# Patient Record
Sex: Female | Born: 1950
Health system: Southern US, Community
[De-identification: ages and names within clinical notes are randomized; demographics above are authoritative.]

## PROBLEM LIST (undated history)

## (undated) ENCOUNTER — Emergency Department: Discharge status: Absent without leave | Payer: Medicare HMO

## (undated) ENCOUNTER — Emergency Department: Admission: EM

## (undated) DIAGNOSIS — K219 Gastro-esophageal reflux disease without esophagitis: Secondary | ICD-10-CM

## (undated) DIAGNOSIS — I452 Bifascicular block: Secondary | ICD-10-CM

## (undated) DIAGNOSIS — I509 Heart failure, unspecified: Secondary | ICD-10-CM

## (undated) DIAGNOSIS — G43919 Migraine, unspecified, intractable, without status migrainosus: Secondary | ICD-10-CM

## (undated) DIAGNOSIS — I10 Essential (primary) hypertension: Secondary | ICD-10-CM

## (undated) DIAGNOSIS — I471 Supraventricular tachycardia, unspecified: Secondary | ICD-10-CM

## (undated) HISTORY — DX: Heart failure, unspecified: I50.9

## (undated) HISTORY — DX: Gastro-esophageal reflux disease without esophagitis: K21.9

## (undated) HISTORY — DX: Migraine, unspecified, intractable, without status migrainosus: G43.919

## (undated) HISTORY — DX: Essential (primary) hypertension: I10

## (undated) HISTORY — DX: Supraventricular tachycardia, unspecified: I47.10

## (undated) HISTORY — DX: Bifascicular block: I45.2

## (undated) HISTORY — DX: Supraventricular tachycardia: I47.1

---

## 2004-12-14 ENCOUNTER — Emergency Department: Payer: Self-pay | Admitting: Internal Medicine

## 2004-12-22 ENCOUNTER — Emergency Department: Payer: Self-pay | Admitting: Emergency Medicine

## 2005-03-03 ENCOUNTER — Ambulatory Visit: Payer: Self-pay | Admitting: Family Medicine

## 2005-06-30 ENCOUNTER — Ambulatory Visit: Payer: Self-pay | Admitting: Family Medicine

## 2005-11-30 ENCOUNTER — Ambulatory Visit: Payer: Self-pay | Admitting: Internal Medicine

## 2006-12-15 DIAGNOSIS — G43919 Migraine, unspecified, intractable, without status migrainosus: Secondary | ICD-10-CM | POA: Insufficient documentation

## 2007-05-19 ENCOUNTER — Ambulatory Visit: Payer: Self-pay | Admitting: Family Medicine

## 2007-05-19 DIAGNOSIS — M545 Low back pain, unspecified: Secondary | ICD-10-CM | POA: Insufficient documentation

## 2007-05-19 DIAGNOSIS — M79604 Pain in right leg: Secondary | ICD-10-CM | POA: Insufficient documentation

## 2007-06-01 ENCOUNTER — Telehealth (INDEPENDENT_AMBULATORY_CARE_PROVIDER_SITE_OTHER): Payer: Self-pay | Admitting: *Deleted

## 2007-06-08 ENCOUNTER — Telehealth: Payer: Self-pay | Admitting: Family Medicine

## 2007-06-15 ENCOUNTER — Telehealth: Payer: Self-pay | Admitting: Family Medicine

## 2007-08-10 ENCOUNTER — Telehealth: Payer: Self-pay | Admitting: Family Medicine

## 2007-09-21 HISTORY — PX: PARATHYROIDECTOMY: SHX19

## 2007-11-23 ENCOUNTER — Ambulatory Visit: Payer: Self-pay | Admitting: Internal Medicine

## 2007-11-23 ENCOUNTER — Encounter: Payer: Self-pay | Admitting: Family Medicine

## 2007-11-23 ENCOUNTER — Inpatient Hospital Stay (HOSPITAL_COMMUNITY): Admission: EM | Admit: 2007-11-23 | Discharge: 2007-12-01 | Payer: Self-pay | Admitting: Family Medicine

## 2007-11-24 ENCOUNTER — Encounter: Payer: Self-pay | Admitting: Family Medicine

## 2007-11-24 ENCOUNTER — Encounter (INDEPENDENT_AMBULATORY_CARE_PROVIDER_SITE_OTHER): Payer: Self-pay | Admitting: Cardiology

## 2007-11-28 ENCOUNTER — Encounter: Payer: Self-pay | Admitting: Family Medicine

## 2007-12-14 ENCOUNTER — Encounter: Admission: RE | Admit: 2007-12-14 | Discharge: 2008-01-16 | Payer: Self-pay | Admitting: *Deleted

## 2007-12-25 ENCOUNTER — Encounter: Payer: Self-pay | Admitting: Family Medicine

## 2007-12-27 ENCOUNTER — Telehealth: Payer: Self-pay | Admitting: Family Medicine

## 2008-01-11 ENCOUNTER — Ambulatory Visit: Payer: Self-pay | Admitting: Family Medicine

## 2008-01-16 LAB — CONVERTED CEMR LAB
CO2: 31 meq/L (ref 19–32)
Chloride: 108 meq/L (ref 96–112)
GFR calc non Af Amer: 79 mL/min
Phosphorus: 2.5 mg/dL (ref 2.3–4.6)
Potassium: 3.9 meq/L (ref 3.5–5.1)

## 2008-01-24 ENCOUNTER — Encounter: Payer: Self-pay | Admitting: Family Medicine

## 2008-01-25 ENCOUNTER — Telehealth: Payer: Self-pay | Admitting: Family Medicine

## 2008-01-26 ENCOUNTER — Encounter: Payer: Self-pay | Admitting: Family Medicine

## 2008-02-02 ENCOUNTER — Telehealth: Payer: Self-pay | Admitting: Family Medicine

## 2008-02-09 ENCOUNTER — Encounter: Payer: Self-pay | Admitting: Family Medicine

## 2008-02-13 ENCOUNTER — Ambulatory Visit: Payer: Self-pay | Admitting: Family Medicine

## 2008-02-13 DIAGNOSIS — R1013 Epigastric pain: Secondary | ICD-10-CM

## 2008-02-13 DIAGNOSIS — K3189 Other diseases of stomach and duodenum: Secondary | ICD-10-CM | POA: Insufficient documentation

## 2008-02-14 ENCOUNTER — Telehealth: Payer: Self-pay | Admitting: Family Medicine

## 2008-02-23 ENCOUNTER — Encounter: Payer: Self-pay | Admitting: Family Medicine

## 2008-02-23 ENCOUNTER — Telehealth: Payer: Self-pay | Admitting: Family Medicine

## 2008-02-23 DIAGNOSIS — Z8679 Personal history of other diseases of the circulatory system: Secondary | ICD-10-CM | POA: Insufficient documentation

## 2008-02-27 ENCOUNTER — Telehealth: Payer: Self-pay | Admitting: Family Medicine

## 2008-02-29 ENCOUNTER — Encounter: Payer: Self-pay | Admitting: Family Medicine

## 2008-03-08 ENCOUNTER — Encounter (INDEPENDENT_AMBULATORY_CARE_PROVIDER_SITE_OTHER): Payer: Self-pay | Admitting: *Deleted

## 2008-03-08 ENCOUNTER — Telehealth: Payer: Self-pay | Admitting: Family Medicine

## 2008-03-13 ENCOUNTER — Encounter: Payer: Self-pay | Admitting: Family Medicine

## 2008-03-13 ENCOUNTER — Ambulatory Visit (HOSPITAL_COMMUNITY): Admission: RE | Admit: 2008-03-13 | Discharge: 2008-03-14 | Payer: Self-pay | Admitting: Surgery

## 2008-03-13 ENCOUNTER — Encounter (INDEPENDENT_AMBULATORY_CARE_PROVIDER_SITE_OTHER): Payer: Self-pay | Admitting: Surgery

## 2008-04-02 ENCOUNTER — Encounter: Payer: Self-pay | Admitting: Family Medicine

## 2008-05-08 ENCOUNTER — Ambulatory Visit: Payer: Self-pay | Admitting: Family Medicine

## 2008-05-08 DIAGNOSIS — M79609 Pain in unspecified limb: Secondary | ICD-10-CM | POA: Insufficient documentation

## 2008-05-08 DIAGNOSIS — M549 Dorsalgia, unspecified: Secondary | ICD-10-CM | POA: Insufficient documentation

## 2008-05-15 ENCOUNTER — Encounter: Payer: Self-pay | Admitting: Family Medicine

## 2008-05-30 ENCOUNTER — Telehealth (INDEPENDENT_AMBULATORY_CARE_PROVIDER_SITE_OTHER): Payer: Self-pay | Admitting: *Deleted

## 2008-05-31 ENCOUNTER — Telehealth (INDEPENDENT_AMBULATORY_CARE_PROVIDER_SITE_OTHER): Payer: Self-pay | Admitting: *Deleted

## 2008-06-10 ENCOUNTER — Encounter: Payer: Self-pay | Admitting: Family Medicine

## 2008-06-28 ENCOUNTER — Telehealth (INDEPENDENT_AMBULATORY_CARE_PROVIDER_SITE_OTHER): Payer: Self-pay | Admitting: *Deleted

## 2008-07-03 ENCOUNTER — Ambulatory Visit: Payer: Self-pay | Admitting: Family Medicine

## 2008-07-03 DIAGNOSIS — J301 Allergic rhinitis due to pollen: Secondary | ICD-10-CM | POA: Insufficient documentation

## 2008-07-17 ENCOUNTER — Telehealth: Payer: Self-pay | Admitting: Family Medicine

## 2008-07-17 DIAGNOSIS — M4800 Spinal stenosis, site unspecified: Secondary | ICD-10-CM | POA: Insufficient documentation

## 2008-08-02 ENCOUNTER — Telehealth: Payer: Self-pay | Admitting: Family Medicine

## 2008-09-09 ENCOUNTER — Telehealth (INDEPENDENT_AMBULATORY_CARE_PROVIDER_SITE_OTHER): Payer: Self-pay | Admitting: *Deleted

## 2008-09-20 HISTORY — PX: LUMBAR LAMINECTOMY: SHX95

## 2008-09-24 ENCOUNTER — Ambulatory Visit: Payer: Self-pay | Admitting: Cardiology

## 2008-09-26 ENCOUNTER — Ambulatory Visit: Payer: Self-pay

## 2008-10-01 ENCOUNTER — Ambulatory Visit: Payer: Self-pay | Admitting: Cardiology

## 2008-10-01 LAB — CONVERTED CEMR LAB
Total CHOL/HDL Ratio: 3.7
Triglycerides: 70 mg/dL (ref 0–149)

## 2008-10-11 ENCOUNTER — Encounter: Payer: Self-pay | Admitting: Family Medicine

## 2008-10-14 DIAGNOSIS — I452 Bifascicular block: Secondary | ICD-10-CM | POA: Insufficient documentation

## 2008-10-15 ENCOUNTER — Ambulatory Visit: Payer: Self-pay | Admitting: Cardiology

## 2008-11-05 ENCOUNTER — Telehealth: Payer: Self-pay | Admitting: Family Medicine

## 2008-11-06 ENCOUNTER — Encounter: Payer: Self-pay | Admitting: Family Medicine

## 2008-11-07 ENCOUNTER — Encounter: Payer: Self-pay | Admitting: Family Medicine

## 2009-02-11 ENCOUNTER — Telehealth: Payer: Self-pay | Admitting: Cardiology

## 2009-02-14 ENCOUNTER — Encounter: Payer: Self-pay | Admitting: Cardiology

## 2009-02-18 ENCOUNTER — Encounter: Payer: Self-pay | Admitting: Family Medicine

## 2009-02-18 ENCOUNTER — Telehealth: Payer: Self-pay | Admitting: Cardiology

## 2009-02-18 ENCOUNTER — Telehealth (INDEPENDENT_AMBULATORY_CARE_PROVIDER_SITE_OTHER): Payer: Self-pay | Admitting: *Deleted

## 2009-04-21 ENCOUNTER — Telehealth: Payer: Self-pay | Admitting: Family Medicine

## 2009-04-28 ENCOUNTER — Encounter: Admission: RE | Admit: 2009-04-28 | Discharge: 2009-04-28 | Payer: Self-pay | Admitting: Neurosurgery

## 2009-05-30 ENCOUNTER — Encounter: Payer: Self-pay | Admitting: Cardiology

## 2009-05-30 ENCOUNTER — Encounter: Payer: Self-pay | Admitting: Family Medicine

## 2009-07-10 ENCOUNTER — Encounter: Payer: Self-pay | Admitting: Cardiology

## 2009-07-14 ENCOUNTER — Encounter: Payer: Self-pay | Admitting: Cardiology

## 2009-07-21 ENCOUNTER — Inpatient Hospital Stay (HOSPITAL_COMMUNITY): Admission: RE | Admit: 2009-07-21 | Discharge: 2009-07-28 | Payer: Self-pay | Admitting: Neurosurgery

## 2009-08-18 ENCOUNTER — Encounter: Payer: Self-pay | Admitting: Cardiology

## 2009-10-15 ENCOUNTER — Encounter: Payer: Self-pay | Admitting: Family Medicine

## 2009-10-15 ENCOUNTER — Encounter: Payer: Self-pay | Admitting: Cardiology

## 2009-10-23 ENCOUNTER — Ambulatory Visit: Payer: Self-pay | Admitting: Family Medicine

## 2009-10-29 ENCOUNTER — Encounter: Admission: RE | Admit: 2009-10-29 | Discharge: 2009-11-20 | Payer: Self-pay | Admitting: Neurosurgery

## 2009-11-27 ENCOUNTER — Telehealth: Payer: Self-pay | Admitting: Family Medicine

## 2009-12-09 ENCOUNTER — Telehealth: Payer: Self-pay | Admitting: Family Medicine

## 2010-01-23 ENCOUNTER — Encounter: Payer: Self-pay | Admitting: Cardiology

## 2010-05-05 ENCOUNTER — Ambulatory Visit: Payer: Self-pay | Admitting: Emergency Medicine

## 2010-05-05 LAB — HM COLONOSCOPY: HM Colonoscopy: NORMAL

## 2010-08-17 ENCOUNTER — Encounter: Payer: Self-pay | Admitting: Cardiology

## 2010-09-01 ENCOUNTER — Encounter: Payer: Self-pay | Admitting: Cardiology

## 2010-09-03 ENCOUNTER — Encounter: Payer: Self-pay | Admitting: Cardiology

## 2010-09-03 ENCOUNTER — Ambulatory Visit: Payer: Self-pay | Admitting: Cardiology

## 2010-09-03 DIAGNOSIS — R002 Palpitations: Secondary | ICD-10-CM | POA: Insufficient documentation

## 2010-09-09 ENCOUNTER — Telehealth (INDEPENDENT_AMBULATORY_CARE_PROVIDER_SITE_OTHER): Payer: Self-pay | Admitting: *Deleted

## 2010-09-17 ENCOUNTER — Ambulatory Visit: Payer: Self-pay | Admitting: Cardiology

## 2010-09-25 ENCOUNTER — Telehealth: Payer: Self-pay | Admitting: Cardiology

## 2010-10-20 NOTE — Progress Notes (Signed)
Summary: [facility]  [facility]   Imported By: Debby Freiberg 03/14/2010 09:49:56  _____________________________________________________________________  External Attachment:    Type:   Image     Comment:   External Document

## 2010-10-20 NOTE — Letter (Signed)
Summary: [facility]  [facility]   Imported By: Lanelle Bal 10/27/2009 12:21:02  _____________________________________________________________________  External Attachment:    Type:   Image     Comment:   External Document

## 2010-10-20 NOTE — Progress Notes (Signed)
Summary: pt wants DNA checked  Phone Note Call from Patient Call back at [phone]   Caller: Patient Call For: Judith Part MD Summary of Call: Pt is asking how she would go about getting her DNA checked.  I asked her why and she said she is trying to find out about some relatives.  They need their's checked as well.  She asked if this is expensive and I told her probably so.  What else do I tell her? Initial call taken by: Lowella Petties CMA,  December 09, 2009 12:03 PM  Follow-up for Phone Call        that is not something I can do here- tell her to check with the health dept for her county and see if they can adv her further Follow-up by: Judith Part MD,  December 09, 2009 1:19 PM  Additional Follow-up for Phone Call Additional follow up Details #1::        Patient advised.Consuello Masse CMA  Additional Follow-up by: Benny Lennert CMA Duncan Dull),  December 09, 2009 3:25 PM

## 2010-10-20 NOTE — Letter (Signed)
Summary: [facility]  [facility]   Imported By: Lanelle Bal 02/13/2010 12:15:40  _____________________________________________________________________  External Attachment:    Type:   Image     Comment:   External Document

## 2010-10-20 NOTE — Progress Notes (Signed)
Summary: Rx Furosemide  Phone Note Refill Request Call back at [phone] Message from:  [facility] on November 27, 2009 11:06 AM  Refills Requested: Medication #1:  FUROSEMIDE 40 MG  TABS 1 by mouth once daily   Last Refilled: 10/27/2009 Received faxed refill request, patient has not been seen since 06/2008.  Called pharmacy to let them know patient can have a refill but she needs to call and schedule appt before refill will be given.    Method Requested: Electronic Initial call taken by: Linde Gillis CMA Duncan Dull),  November 27, 2009 11:08 AM  Follow-up for Phone Call        refil times one and sched f/u please px written on EMR for call in  Follow-up by: Judith Part MD,  November 27, 2009 1:52 PM  Additional Follow-up for Phone Call Additional follow up Details #1::        Medication phoned to [facility] pharmacy as instructed. Rob will let pt know needs to make appt. before more refills.Lewanda Rife LPN  November 27, 2009 2:32 PM     New/Updated Medications: FUROSEMIDE 40 MG  TABS (FUROSEMIDE) 1 by mouth once daily Prescriptions: FUROSEMIDE 40 MG  TABS (FUROSEMIDE) 1 by mouth once daily  #30 x 0   Entered and Authorized by:   Judith Part MD   Signed by:   Lewanda Rife LPN on 14/78/2956   Method used:   Telephoned to ...         RxID:   [id number]

## 2010-10-22 NOTE — Progress Notes (Signed)
Summary: monitor results 09/17/10  Phone Note Outgoing Call   Call placed by: Katina Dung, RN, BSN,  September 25, 2010 2:12 PM Call placed to: Patient Summary of Call: monitor results   Follow-up for Phone Call        Dr Shirlee Latch reviewed monitor results done  09/17/10--NSR,PACs, no significant arrhythmias--LMTCB for pt Katina Dung, RN, BSN  September 25, 2010 2:13 PM --pt notified of monitor results

## 2010-10-22 NOTE — Procedures (Signed)
Summary: summary report  summary report   Imported By: Mirna Mires 09/28/2010 08:54:08  _____________________________________________________________________  External Attachment:    Type:   Image     Comment:   External Document

## 2010-10-22 NOTE — Letter (Signed)
Summary: [facility]   [facility]   Imported By: Roderic Ovens 09/24/2010 15:41:41  _____________________________________________________________________  External Attachment:    Type:   Image     Comment:   External Document

## 2010-10-22 NOTE — Procedures (Signed)
Summary: Summary Report  Summary Report   Imported By: Erle Crocker 10/07/2010 16:21:42  _____________________________________________________________________  External Attachment:    Type:   Image     Comment:   External Document

## 2010-10-22 NOTE — Assessment & Plan Note (Signed)
Summary: [facility]   Primary Provider:  Dr. Lacie Scotts   History of Present Illness: 60 yo with history of SVT and atypical chest pain presents for followup.  Her back is feeling much better than the last time I saw her, and her mobility is improved (she had a laminectomy).  She still occasionally feels her heart beat "fast."  This can last for a few minutes at a time.  It does not cause lightheadedness or syncope.  She does get "soreness" in her chest with these episodes that can actually last for hours.  These episodes happen about once a month.  No exertional chest pain.  No exertionaly dyspnea.    ECG: NSR, LAFB, RBBB  Current Medications (verified): 1)  Furosemide 40 Mg  Tabs (Furosemide) .Marland Kitchen.. 1 By Mouth Once Daily 2)  Omeprazole 20 Mg  Cpdr (Omeprazole) .Marland Kitchen.. 1 By Mouth Each Am 30 Minutes Before Breakfast and Again in Evening 3)  Ultram 50 Mg  Tabs (Tramadol Hcl) .... Take 1-2 By Mouth Up To Three Times A Day As Needed Pain 4)  Flexeril 10 Mg  Tabs (Cyclobenzaprine Hcl) .... 1/2 To 1 By Mouth Three Times A Day As Needed Back/shoulderblade Pain 5)  Gabapentin 100 Mg Caps (Gabapentin) .... One By Mouth Three Times A Day As Needed 6)  Hydrocodone-Acetaminophen 5-500 Mg Tabs (Hydrocodone-Acetaminophen) .... As Directed 7)  Flonase 50 Mcg/act Susp (Fluticasone Propionate) .... 2 Sprays in Each Nostril Once Daily 8)  Meloxicam 7.5 Mg Tabs (Meloxicam) .Marland Kitchen.. 1 By Mouth Daily 9)  Vitamin D 1000 Unit Tabs (Cholecalciferol) .Marland Kitchen.. 1 By Mouth Daily 10)  Fosamax 35 Mg Tabs (Alendronate Sodium) .Marland Kitchen.. 1 By Mouth Weekly  Allergies (verified): No Known Drug Allergies  Past History:  Past Medical History: 1.  Hyperparathyroidism.  The patient is status post parathyroidectomy in March 2009. 2.  History of SVT in the setting of hypercalcemia.  Holter monitor done in January 2010, showed occasional PAC.  There were no runs of SVT.  3. Lumbar spine disease.  The patient has lumbar spondylosis, lumbar degenerative  disk disease, radiculopathy with pain down her right leg.  s/p laminectomy in 2010.  4. Cardiac MRI in March 2009.  This was done to evaluate for source of SVT.  There is no evidence for infiltrative disease.  EF was 58%. 5. Gastroesophageal reflux disease. 6. Chest pain.  Adenosine Myoview done in January 2010, EF was 65%.  There was some evidence for some breast attenuation.  No evidence for ischemia. 7. RBB W/ LFAB (ICD-426.52) 8. ALLERGIC RHINITIS, SEASONAL (ICD-477.0) 9. MIGRAINE NOS W/INTRACTABLE MIGRAINE (ICD-346.91) 10. nephrolithiasis  surgery--- Dr Silvano Rusk podiatry -- Dr Al Corpus  neurosx --Dr Sherren Kerns  Family History: Reviewed history from 09/01/2010 and no changes required. does not know much fam hx -- ? not in contact pt is considering trying to get some fam hx  Father: CVA or Stroke:  Mother: brain tumor  Social History: Reviewed history from 10/14/2008 and no changes required. unemployed, disabled Tobacco Use - No.  Alcohol Use - no Drug Use - no Married  Six children  Review of Systems       All systems reviewed and negative except as per HPI.   Vital Signs:  Patient profile:   60 year old female Height:      66 inches Weight:      140 pounds BMI:     22.68 Pulse rate:   76 / minute Resp:     16 per minute BP sitting:  102 / 74  (left arm)  Vitals Entered By: Marrion Coy, CNA (September 03, 2010 10:21 AM)  Physical Exam  General:  Well developed, well nourished, in no acute distress. Neck:  Neck supple, no JVD. No masses, thyromegaly or abnormal cervical nodes. Lungs:  Clear bilaterally to auscultation and percussion. Heart:  Non-displaced PMI, chest non-tender; regular rate and rhythm, S1, S2 without murmurs, rubs or gallops. Widely split S2.  Carotid upstroke normal, no bruit. Pedals normal pulses. No edema, no varicosities. Abdomen:  Bowel sounds positive; abdomen soft and non-tender without masses, organomegaly, or hernias noted. No  hepatosplenomegaly. Extremities:  No clubbing or cyanosis. Neurologic:  Alert and oriented x 3. Psych:  Normal affect.   Impression & Recommendations:  Problem # 1:  SUPRAVENTRICULAR TACHYCARDIA, HX OF (ICD-V12.59) Still getting palpitations where she feels her heart race about once a month.  No lightheadedness or syncope.  Will have her start Toprol XL 12.5 mg daily and will get a 48 hour holter.   Problem # 2:  CHEST TIGHTNESS-PRESSURE-OTHER ([id number]) Very atypical chest pain with normal myoview in 2010.  No further workup at this time.  I recommended that she take ASA 81 mg daily.   Other Orders: Holter (Holter)  Patient Instructions: 1)  Start Toprol XL (metoprolol succ) 25mg  1/2 tablet once daily. 2)  Start Aspirin 81mg  once daily. 3)  Your physician has recommended that you wear a 48 hour holter monitor.  Holter monitors are medical devices that record the heart's electrical activity. Doctors most often use these monitors to diagnose arrhythmias. Arrhythmias are problems with the speed or rhythm of the heartbeat. The monitor is a small, portable device. You can wear one while you do your normal daily activities. This is usually used to diagnose what is causing palpitations/syncope (passing out). 4)  Your physician wants you to follow-up in: 1 year. You will receive a reminder letter in the mail two months in advance. If you don't receive a letter, please call our office to schedule the follow-up appointment. Prescriptions: METOPROLOL SUCCINATE 25 MG XR24H-TAB (METOPROLOL SUCCINATE) Take 1/2  tablet by mouth daily  #30 x 6   Entered by:   Sherri Rad, RN, BSN   Authorized by:   Marca Ancona, MD   Signed by:   Sherri Rad, RN, BSN on 09/03/2010   Method used:   Electronically to        [facility]* (retail)       [address]       [place], [place]  [address]       Ph: [phone]       Fax: [phone]   RxID:   [id number]

## 2010-10-22 NOTE — Letter (Signed)
Summary: [facility]  [facility]   Imported By: Lanelle Bal 09/03/2010 14:23:20  _____________________________________________________________________  External Attachment:    Type:   Image     Comment:   External Document

## 2010-10-22 NOTE — Progress Notes (Signed)
Summary: holter monitor  Phone Note Outgoing Call   Action Taken: Appt scheduled Summary of Call: Patient have appt. for 09/17/10 for monitor Initial call taken by: Marcos Eke,  September 09, 2010 1:47 PM

## 2010-12-23 LAB — BASIC METABOLIC PANEL
Calcium: 7.7 mg/dL — ABNORMAL LOW (ref 8.4–10.5)
Chloride: 105 mEq/L (ref 96–112)
Creatinine, Ser: 1.13 mg/dL (ref 0.4–1.2)
GFR calc Af Amer: >60 mL/min (ref >60)
GFR calc Af Amer: >60 mL/min (ref >60)
GFR calc non Af Amer: 50 mL/min — ABNORMAL LOW (ref >60)
GFR calc non Af Amer: >60 mL/min (ref >60)
Glucose, Bld: 128 mg/dL — ABNORMAL HIGH (ref 70–99)
Glucose, Bld: 141 mg/dL — ABNORMAL HIGH (ref 70–99)
Sodium: 138 mEq/L (ref 135–145)

## 2010-12-23 LAB — POCT I-STAT 4, (NA,K, GLUC, HGB,HCT)
Glucose, Bld: 126 mg/dL — ABNORMAL HIGH (ref 70–99)
HCT: 24 % — ABNORMAL LOW (ref 36.0–46.0)
Hemoglobin: 10.2 g/dL — ABNORMAL LOW (ref 12.0–15.0)
Sodium: 140 mEq/L (ref 135–145)

## 2010-12-23 LAB — CBC
Hemoglobin: 11.1 g/dL — ABNORMAL LOW (ref 12.0–15.0)
Hemoglobin: 11.8 g/dL — ABNORMAL LOW (ref 12.0–15.0)
Hemoglobin: 11.8 g/dL — ABNORMAL LOW (ref 12.0–15.0)
MCHC: 34.4 g/dL (ref 30.0–36.0)
MCHC: 34.5 g/dL (ref 30.0–36.0)
MCV: 91.4 fL (ref 78.0–100.0)
Platelets: 137 10*3/uL — ABNORMAL LOW (ref 150–400)
Platelets: 142 10*3/uL — ABNORMAL LOW (ref 150–400)
RBC: 3.72 MIL/uL — ABNORMAL LOW (ref 3.87–5.11)
RDW: 14.2 % (ref 11.5–15.5)
RDW: 14.6 % (ref 11.5–15.5)
WBC: 7.4 10*3/uL (ref 4.0–10.5)
WBC: 9.7 10*3/uL (ref 4.0–10.5)

## 2010-12-23 LAB — DIFFERENTIAL
Basophils Absolute: 0 10*3/uL (ref 0.0–0.1)
Basophils Relative: 0 % (ref 0–1)
Eosinophils Absolute: 0.2 10*3/uL (ref 0.0–0.7)
Neutrophils Relative %: 81 % — ABNORMAL HIGH (ref 43–77)

## 2010-12-23 LAB — MRSA PCR SCREENING: MRSA by PCR: NEGATIVE

## 2010-12-24 LAB — BASIC METABOLIC PANEL
BUN: 19 mg/dL (ref 6–23)
CO2: 29 mEq/L (ref 19–32)
Calcium: 9.6 mg/dL (ref 8.4–10.5)
Chloride: 106 mEq/L (ref 96–112)
Creatinine, Ser: 0.97 mg/dL (ref 0.4–1.2)
GFR calc Af Amer: >60 mL/min (ref >60)
GFR calc non Af Amer: 59 mL/min — ABNORMAL LOW (ref >60)
Glucose, Bld: 92 mg/dL (ref 70–99)
Potassium: 3.9 mEq/L (ref 3.5–5.1)
Sodium: 143 mEq/L (ref 135–145)

## 2010-12-24 LAB — URINALYSIS, ROUTINE W REFLEX MICROSCOPIC
Bilirubin Urine: NEGATIVE
Glucose, UA: NEGATIVE mg/dL
Ketones, ur: NEGATIVE mg/dL
Nitrite: NEGATIVE
Specific Gravity, Urine: 1.023 (ref 1.005–1.030)
pH: 6 (ref 5.0–8.0)

## 2010-12-24 LAB — TYPE AND SCREEN
ABO/RH(D): B POS
Antibody Screen: NEGATIVE

## 2010-12-24 LAB — CBC
Platelets: 202 10*3/uL (ref 150–400)
RBC: 4.01 MIL/uL (ref 3.87–5.11)
RDW: 13.6 % (ref 11.5–15.5)
WBC: 6.3 10*3/uL (ref 4.0–10.5)

## 2010-12-24 LAB — APTT: aPTT: 29 seconds (ref 24–37)

## 2010-12-24 LAB — PREPARE RBC (CROSSMATCH)

## 2010-12-24 LAB — ABO/RH: ABO/RH(D): B POS

## 2011-02-02 NOTE — Discharge Summary (Signed)
NAME:  Madison, Miller           ACCOUNT NO.:  [id number]   MEDICAL RECORD NO.:  [id number]          PATIENT TYPE:  INP   LOCATION:  4737                         FACILITY:  [facility]   PHYSICIAN:  Madaline Savage, MD        DATE OF BIRTH:  March 21, 1951   DATE OF ADMISSION:  11/22/2007  DATE OF DISCHARGE:  12/01/2007                               DISCHARGE SUMMARY   ADDENDUM:  This is an addendum to discharge summary dictated by Dr.  Salome Arnt on November 27, 2007.  Please see the prior discharge summary for  a complete list of discharge diagnoses and hospital course.   CONSULTATIONS IN THE HOSPITAL:  Dr. Phoebe Perch from neurosurgery, Dr. Donnie Aho  from cardiology, and Dr. Graciela Husbands from electrophysiology.   PROCEDURES DONE IN THE HOSPITAL:  1. She had a sestamibi scan of the parathyroid done on November 29, 2007,      which showed a suspected parathyroid adenoma in the upper pole of      the left lobe of the thyroid.  2. She had lumbar spine MRI done on November 29, 2007, which showed      advanced facet arthropathy at L5-S1 with 8 mm of anterolisthesis,      shallow protrusion of the disk material, stenosis of the canal,      most pronounced in the lateral recess, lesser degenerative changes      at L4-L5 with disk bulge slightly more pronounced towards the left,      mild facet arthropathy, mild narrowing of lateral recess.  3. She had an MRI of her heart done on November 28, 2007, which showed no      MRI evidence of left ventricular myocardial infiltrative process.   HOSPITAL COURSE:  1. Parathyroid adenoma.  Madison Miller was admitted and was found to      have a hypercalcemia with a calcium elevated of more than 13.5.  We      contacted Dr. Evlyn Kanner, the endocrinologist, and he recommended      getting a sestamibi scan and PTH level.  Her PTH level was elevated      at 260.3 and her sestamibi scan showed a suspected parathyroid      adenoma.  As per Dr. Evlyn Kanner, she would need surgery.  I called in a  consult for Dr. Gerrit Friends, but I was told that they would prefer to      see the patient as an outpatient.  She was started on IV fluids and      Lasix, and her calcium has steadily come down and at the time of      discharge her calcium is 10.8.  She is asymptomatic with this.  I      have discussed it with Dr. Evlyn Kanner, who has recommended that she can      be discharged home with the p.o. Lasix, and I would also encourage      her to take lots of fluids by mouth.  I called with Dr. Ardine Eng      office and I have gotten her an appointment for Friday, April  3, at      11:15 a.m.  2. Spinal stenosis.  Madison Miller was complaining of leg pain on the      right side and so we consulted [facility] and was seen      by Dr. Phoebe Perch.  At this time, there is no definite weakness.  Her      straight leg raising test is negative.  Dr. Phoebe Perch recommended to      continue physical therapy and can also try a short course of      steroids.  With physical therapy her pain has improved and she will      be referred for outpatient physical therapy.  She is also      recommended to follow up with Dr. Phoebe Perch as an outpatient, and she      will be given his phone number.  3. Supraventricular tachycardia.  She had supraventricular      tachycardia, and at this time we think it is secondary to her      hypercalcemia.  We did a cardiac MRI to rule out an infiltrative      process and it was negative.  Since her calcium has been corrected,      she has not had any more arrhythmias.   DISPOSITION:  She is now being discharged home in stable condition.   FOLLOW UP:  We got her an appointment to see Dr. Gerrit Friends to get her  parathyroid surgery, and she has an appointment on Friday, April 3, at  10:15 a.m. She is given his phone number, [phone].  She is also asked  to follow up with Dr. Phoebe Perch, the neurosurgeon, and she is given his  phone number, which is [phone].  She is asked to call and get an   appointment to see him in 1 to 2 months.   DISCHARGE MEDICATIONS:  She is being discharged on Lasix 40 mg by mouth  daily and aspirin 81 mg daily.      Madaline Savage, MD  Electronically Signed     PKN/MEDQ  D:  12/01/2007  T:  12/02/2007  Job:  [id number]

## 2011-02-02 NOTE — Assessment & Plan Note (Signed)
[facility]                            CARDIOLOGY OFFICE NOTE   Madison Miller, Madison Miller                  MRN:          [id number]  DATE:09/24/2008                            DOB:          [birth year]    PRIMARY CARE PHYSICIAN:  Dr. Evelene Croon at [facility]  practice.   NEUROSURGEON:  Clydene Fake, MD at [facility], [facility]   HISTORY OF PRESENT ILLNESS:  This is a 60 year old with a history of SVT  and lumbar degenerative disk disease with radiculopathy who presents to  Cardiology Clinic for preoperative evaluation.  The patient does have a  long history of low back pain and radicular pain down her right leg.  She has been noted on MRI to have lumbar degenerative disk disease,  spondylolisthesis, and radiculopathy and she has been set up for back  surgery with Dr. Phoebe Perch.  Prior to that, he wanted her to have a cardiac  evaluation.  The patient was in the hospital earlier in 2009 for primary  hyperparathyroidism.  She had hypercalcemia and underwent  parathyroidectomy.  She did develop SVT in the setting of hypercalcemia.  She also had a cardiac MRI done in March 2009 to assess for infiltrative  disease as a cause of her AV arrhythmia.  She did have normal LV  systolic function and no evidence for infiltrative disease on her MRI.  Since that time, the patient denies any further episodes of  palpitations, lightheadedness, or syncope.  She has not had any more  documented SVT.  She is not very active because of her back pain, though  she denies shortness of breath with the degree of exertion that she  actually performs.  She does report occasional episodes of left-sided  chest tightness.  These happen 2 or 3 times a month.  It tends to last  for about 10 minutes at a time.  She says nothing in particular brings  on the chest tightness, but then she states that the chest tightness  does resolve when she sits  down and rests, so it sounds like there may  possibly be an exertional component to this chest tightness.  These  episodes have been present for less than a year, and prior to that, she  had no episodes of chest pain.  The only cardiac medication the patient  takes his Lasix.  She is unsure exactly why she is on Lasix.  It may  possibly be in the setting of her prior hypercalcemia.   PAST MEDICAL HISTORY:  1. Primary hyperparathyroidism.  The patient is status post      parathyroidectomy in March 2009.  2. History of supraventricular tachycardia in the setting of      hypercalcemia.  3. Lumbar spine disease.  The patient has lumbar spondylosis, lumbar      degenerative disk disease, lumbar spondylosis, and radiculopathy      with pain down her right leg.  4. Cardiac MRI March 2009.  This was done to evaluate source of SVT.      There is no evidence for infiltrative disease.  EF was 58%.  5. Gastroesophageal reflux disease.   SOCIAL HISTORY:  The patient has never smoked.  She does not drink  alcohol, does not use any drugs.  She lives with her children.  She is  unemployed at this time and is trying to get on disability.   FAMILY HISTORY:  There is no family history of premature coronary artery  disease.  Father had a stroke.  Mother had died of a brain tumor.   MEDICATIONS:  1. Lasix 40 mg daily.  2. Omeprazole 20 mg daily.  3. Meloxicam 15 mg daily.  4. Tramadol.  5. Gabapentin 100 mg daily.   REVIEW OF SYSTEMS:  Negative except as noted in history of present  illness.   EKG shows a normal sinus rhythm.  There is a right bundle-branch block.  There is a left anterior fascicular block.   PHYSICAL EXAMINATION:  VITAL SIGNS:  Blood pressure 124/68, heart rate  73 and regular.  GENERAL:  This is a well-developed female in no apparent distress.  NEUROLOGIC:  Alert and oriented x3.  Normal affect.  LUNGS:  Clear to auscultation bilaterally with normal respiratory  effort.   CARDIOVASCULAR:  Heart regular S1 and S2.  There is a widely split S2.  No S3, no S4.  There is soft 1/6 crescendo-decrescendo right upper  sternal border systolic ejection murmur.  There is no peripheral edema.  There are 2+ posterior tibial pulses bilaterally.  There is no carotid  bruit.  ABDOMEN:  Soft, nontender.  No hepatosplenomegaly.  Normal bowel sounds.  EXTREMITIES:  No clubbing or cyanosis.  HEENT:  Normal exam.  SKIN:  Normal exam.  MUSCULOSKELETAL:  Normal exam.   ASSESSMENT AND PLAN:  This is a 60 year old with history of lumbar  degenerative disk disease and supraventricular tachycardia who has  episodes of atypical chest pain and presents to Cardiology Clinic for  evaluation prior to her back surgery.  1. Chest pain.  The patient does have occasional episodes of left-      sided chest tightness.  The patient is not a great historian, but      these do seem to be possibly related to exertion and resolving with      rest.  They do occur infrequently.  I do think it will be      reasonable to obtain an adenosine Myoview to further work this up      to assess for any ischemia.  The patient would not be able to walk      on the treadmill due to her back pain.  The patient does have an      abnormal EKG with left anterior fascicular block and right bundle-      branch block that could be consistent with ischemic heart disease.  2. We will check the patient's lipids, as this has not been done for a      while.  3. History of supraventricular tachycardia.  The patient does have an      abnormal EKG.  She has a right bundle-branch block with left      anterior fascicular block.  We will go ahead and set her up to do a      48-hour Holter monitor to assess for any evidence for an      arrhythmia.  She should be on a beta blocker peri-operatively even      if the stress test is negative given      the  history of SVT.  We will start her on one after the Holter is       completed.  4. I will see the patient back in 2 weeks after her Holter and stress      test.     Marca Ancona, MD  Electronically Signed    DM/MedQ  DD: 09/24/2008  DT: 09/25/2008  Job #: [id number]   cc:   Clydene Fake, M.D.  [place] Lacie Scotts

## 2011-02-02 NOTE — Discharge Summary (Signed)
NAME:  Madison Miller, Madison Miller           ACCOUNT NO.:  [id number]   MEDICAL RECORD NO.:  [id number]          PATIENT TYPE:  INP   LOCATION:  4737                         FACILITY:  [facility]   PHYSICIAN:  Hillery Aldo, M.D.   DATE OF BIRTH:  09/15/1952   DATE OF ADMISSION:  11/23/2007  DATE OF DISCHARGE:                               DISCHARGE SUMMARY   PRIMARY CARE PHYSICIAN:  None.   DISCHARGE DIAGNOSES:  1. Supraventricular tachycardia.  2. Bifascicular block.  3. Hypercalcemia of unclear etiology, workup ongoing.  4. History of hypertension.  5. Bilateral nephrolithiasis.  6. Constipation.  7. Spinal stenosis.  8. Hirsutism.   DISCHARGE MEDICATIONS:  To be dictated at time of actual discharge.   CONSULTATIONS:  1. Dr. Donnie Aho of cardiology.  2. Dr. Graciela Husbands of electrophysiology.   HISTORY OF PRESENT ILLNESS:  The patient is a 60 year old female who  presented to the urgent care with complaint of back pain.  She had a  longstanding history of chronic lower back pain and while at the urgent  care setting, developed chest pain with palpitations.  She subsequently  was transferred to the emergency room for further evaluation and workup.  For the full details, please see the dictated report done by Dr. Lavera Guise.   PROCEDURES/DIAGNOSTICS:  1. CT scan of the lumbar spine on November 22, 2007 showed advanced      bilateral facet degenerative hypertrophy L5-S1 allowing 7 mm      anterolisthesis resulting in central canal and bilateral foraminal      stenosis.  Bilateral nephrolithiasis.  The distal ureters are not      included on the scan.  2. Chest x-ray on November 22, 2007 showed mild cardiomegaly.  3. Cardiolite on November 24, 2007 showed no evidence of pharmacologic      induced myocardial ischemia or scar.  Left ventricular wall motion      study within normal limits.  Calculated left ventricular ejection      fraction was 54%.   LABORATORY DATA:  Will be dictated at the time of actual  discharge.   HOSPITAL COURSE:  1. Spinal stenosis:  The patient has a longstanding history of back      pain and prefers to have this evaluated as an outpatient.  She has      no loss of sensation or strength.  2. Chest pain:  The patient's chest pain was likely due to      supraventricular tachycardia.  Cardiac enzymes were cycled q.8 h.      x3 sets and were negative.  She subsequently underwent a Myoview      which was negative.  At this point, she is being evaluated for the      possibility of cardiac sarcoid given her conduction disturbance.      She has been seen in consultation with Dr. Graciela Husbands who recommends      along with Dr. Donnie Aho that a cardiac MRI be performed to rule out      cardiac sarcoid versus other infiltrative process.  3. Hypercalcemia:  The patient had an evaluation for her  hypercalcemia.  The ACE level was 22 which is negative, but not      always a sensitive marker for sarcoidosis.  A telephone      consultation was made with Dr. Evlyn Kanner regarding the appropriate      workup for hypercalcemia and he suggested checking a phosphorus, 25      hydroxy vitamin D, and a 125 dihydroxy vitamin D level.  Her      calcium level has steadily risen and today it is 13.5, and we are      initiating treatment with vigorous normal saline hydration followed      by Lasix.  4. Supraventricular tachycardia:  The patient has maintained      reasonable heart rates throughout her hospital stay.  She has not      had any recurrent evidence of supraventricular tachycardia since      her evaluation in the emergency department.  5. History of hypertension:  The patient's blood pressure has not been      elevated.  Nevertheless, she has been commenced on Lasix therapy to      treat her hypercalcemia.  6. Bilateral nephrolithiasis:  The patient is asymptomatic with regard      to this issue.  7. Constipation:  This is likely due to underlying hypercalcemia.  She      was put on a  bowel regimen.  At this time, she is doing well with      regard to having regular bowel movements.  8. Hirsutism:  The patient had a complete hormonal evaluation      including FSH, LH, testosterone, prolactin and TSH, all of which      were within the normal range.   DISPOSITION:  The patient will be discharged when she is medically  stable and her evaluation is completed.      Hillery Aldo, M.D.  Electronically Signed     CR/MEDQ  D:  11/27/2007  T:  11/28/2007  Job:  [id number]

## 2011-02-02 NOTE — Consult Note (Signed)
NAME:  Madison Miller, Madison Miller           ACCOUNT NO.:  [id number]   MEDICAL RECORD NO.:  [id number]          PATIENT TYPE:  INP   LOCATION:  6529                         FACILITY:  [facility]   PHYSICIAN:  Georga Hacking, M.D.DATE OF BIRTH:  09/15/1952   DATE OF CONSULTATION:  11/23/2007  DATE OF DISCHARGE:                                 CONSULTATION   This 60 year old black female was a very poor historian.  She evidently  has seen Dr. Milinda Antis and also sees a doctor at [facility]  but has not seen him in some time.  She presented the [facility] yesterday mainly complaining of back pain and leg pain but  had noticed some palpitations as well as some chest pain.  She was  brought to have some supraventricular tachycardia and was transferred by  ambulance to [facility].  She evidently was given a  nitroglycerin and some aspirin and the symptoms evidently improved.  She  was found to have fairly severe spinal stenosis.  She was found to have  a D-dimer of 0.78.  Glucose was mildly elevated at 105 and 123.  She had  not previously been on any medications.  Her calcium was found to be  elevated at 11.7.  Hemoglobin A1c was normal.  Initial troponin and MB  were negative.  The patient is a very poor historian but is currently  pain-free.  She was evidently evaluated by physical therapy and was  found to have longstanding back pain and bilateral nephrolithiasis.  She  was found to have an EKG with bifascicular block but no old EKGs were  available for comparison.  The patient did not give a good history of  exertional chest pain.  There is one rhythm strip showing a wide-complex  tachycardia resembling her baseline rhythm.  Subsequent EKGs have shown  bifascicular block and have shown her to be in normal sinus rhythm.   Past history is remarkable for hypertension but not currently taking any  medications.  There is no history of hyperlipidemia, diabetes.   She does  have a history of either ulcers or some sort of digestive disorder   PREVIOUS SURGERY:  None.   ALLERGIES:  None.   CURRENT MEDICATIONS:  None.   FAMILY HISTORY:  Father died of a stroke.  Mother died of a brain tumor.  She has no brothers or sisters.   SOCIAL HISTORY:  She works part-time in a Radio broadcast assistant.  She is  married with two children, one daughter is 20.  Husband evidently works  in [employer].   REVIEW OF SYSTEMS:  Has a significant history of palpitations.  She has  no eye, ear, nose or throat problems.  No recent weight loss.  She  denies shortness of breath, cough, wheezing or asthma.  Normally does  not have exertional-type chest pain.  She does feel the pain was  somewhat pleuritic yesterday.  She denies any abdominal pain, diarrhea,  constipation or hematochezia.  No genitourinary symptoms although does  have a history of nephrolithiasis.  Significant arthritis involving leg  and spine.  EXAMINATION:  She is a small-statured, small-framed black female in no  acute distress.  Blood pressure is currently at 120/75, pulse was currently 70 and  regular.  SKIN:  Warm and dry.  ENT:  EOMI.  PERRLA.  CNS clear.  Pharynx negative.  NECK: Supple with no masses, JVD, thyromegaly or bruits.  LUNGS:  Clear to A&P.  CARDIAC:  Normal S1 and S2, no S3 or murmur.  ABDOMEN:  Soft and nontender.  Femoral and distal pulses are 2+.   A 12-lead EKG shows left axis deviation, right bundle branch block,  normal sinus rhythm.  Laboratory data showed a normal CBC.  D-dimer is  0.78 on admission.  Sodium is 139, potassium 3.5, chloride 104, CO2 29,  glucose 123, BUN 12, creatinine 0.75,  There is a note of a calcium of  11.7 in the chart.  Initial set of cardiac enzymes was negative.   IMPRESSION:  1. Supraventricular tachycardia.  It may be paroxysmal and may be      associated with chest pain.  2. Bifascicular block on EKG.  No known EKGs for comparison.  3.  Spinal stenosis.  4. History of nephrolithiasis.  5. Hypercalcemia.   RECOMMENDATIONS:  History is quite vague.  She may have been have having  SVT associated with palpitations.  She does have a known bifascicular  block but no old EKGs available for comparison.  My recommendations  would be to try to find out if she has ever had any old EKGs, checking  with her doctors, and then maybe try a low-dose beta blocker to prevent  recurrent SVT.  I would repeat a calcium and start with an adenosine  Cardiolite scan as I think her spinal stenosis would keep her from  walking.  I am uncertain what the cause of the pain is but it possibly  could be due to supraventricular tachycardia.  She does have known  bifascicular block.  We are uncertain of the chronicity of this.  She  also needs have an echocardiogram.   Thank you for asking me to see her with you.      Georga Hacking, M.D.  Electronically Signed     WST/MEDQ  D:  11/23/2007  T:  11/24/2007  Job:  [id number]   cc:   [facility]

## 2011-02-02 NOTE — Op Note (Signed)
NAME:  Madison Miller, Madison Miller           ACCOUNT NO.:  [id number]   MEDICAL RECORD NO.:  [id number]          PATIENT TYPE:  OIB   LOCATION:  5121                         FACILITY:  [facility]   PHYSICIAN:  Velora Heckler, MD      DATE OF BIRTH:  23-Jul-1951   DATE OF PROCEDURE:  03/13/2008  DATE OF DISCHARGE:                               OPERATIVE REPORT   PREOPERATIVE DIAGNOSIS:  Primary hyperparathyroidism.   POSTOPERATIVE DIAGNOSIS:  Primary hyperparathyroidism.   PROCEDURE:  Left superior minimally invasive parathyroidectomy.   SURGEON:  Velora Heckler, MD, FACS   ASSISTANT:  Ollen Gross. Vernell Morgans, MD, FACS   ANESTHESIA:  General per Dr. Sheldon Silvan.   ESTIMATED BLOOD LOSS:  Minimal.   PREPARATION:  Betadine.   COMPLICATIONS:  None.   INDICATIONS:  The patient is a 60 year old black female found in March  2009 to be hypercalcemic.  Calcium level was 12.0.  Intact parathyroid  hormone level was obtained and was elevated at 260.  Renal function was  normal.  Sestamibi scan performed November 29, 2007, localized an area of  increased uptake in the left superior position consistent with  parathyroid adenoma.  The patient was referred to General Surgery and  evaluated in May 2009.  She is now prepared and brought to the operating  room.   BODY OF REPORT:  Procedure was done in OR #16 at [facility].  The patient was brought to the operating room, placed in  supine position on the operating room table.  Following administration  of general anesthesia, the patient was positioned and then prepped and  draped in the usual strict aseptic fashion.  After ascertaining that an  adequate level of anesthesia have been achieved, a left neck incision  was made with #15 blade.  Dissection was carried through subcutaneous  tissues and platysma.  Hemostasis was obtained with the electrocautery.  Skin flaps were developed circumferentially.  Weitlaner retractor was  placed for exposure.   Strap muscles were incised in the midline and  reflected to the left.  The left thyroid lobe was exposed.  The left  thyroid lobe was gently mobilized with blunt dissection with a Scientist, forensic.  It is elevated.  Posterior to the superior pole, there is a  soft tissue mass.  This was dissected out.  It measured approximately  3.5 cm in length.  It lies between the inferior portion of the larynx  and the esophagus.  Recurrent nerve was identified and preserved.  Mass  was gently dissected out.  Vascular tributaries are divided between  small Ligaclips.  The entire mass was excised and submitted to pathology  where Dr. Berneta Levins confirms parathyroid tissue consistent with  adenoma on frozen section biopsy.  Good hemostasis was noted.  Surgicel  was placed in the operative field.  Strap muscles were closed with  interrupted 3-0 Vicryl sutures.  Platysma was closed with interrupted 3-  0 Vicryl sutures.  The skin was anesthetized with local anesthetic.  The  skin was closed with a running 4-0 Monocryl subcuticular suture.  The  wound was washed and dried and Benzoin and Steri-Strips were applied.  Sterile dressings were applied.  The patient was awakened from  anesthesia and brought to the recovery room in stable condition.  The  patient tolerated the procedure well.      Velora Heckler, MD  Electronically Signed     TMG/MEDQ  D:  03/13/2008  T:  03/14/2008  Job:  [id number]   cc:   Marne A. Milinda Antis, MD  Duke Salvia, MD, [facility]  Georga Hacking, M.D.

## 2011-02-02 NOTE — Assessment & Plan Note (Signed)
[facility]                            CARDIOLOGY OFFICE NOTE   BONNY, VANLEEUWEN                  MRN:          [id number]  DATE:10/15/2008                            DOB:          1951/02/19    PRIMARY CARE PHYSICIAN:  Dr. Evelene Croon, [facility].   NEUROSURGEON:  Clydene Fake, MD at [facility] [place].   HISTORY OF PRESENT ILLNESS:  This is a 60 year old with a history of SVT  and lumbar degenerative disk disease with radiculopathy who presents to  Cardiology Clinic for preoperative evaluation.  The patient has a long  history of low back pain and radicular pain down the right leg.  She has  been noted on MRI to have lumbar degenerative disk disease,  spondylolisthesis, and radiculopathy, and she has been set up for back  surgery with Dr. Phoebe Perch.  Prior to that, he wanted her to have cardiac  evaluation.  Since I last saw her, the patient has had an adenosine  Myoview which was a low-risk scan and EF was 65%.  There was some breast  attenuation, but overall there is no evidence for ischemia.  She had a  Holter monitor done that showed PACs, but no runs of SVT.  Her  cholesterol was done.  LDL was 108, which is reasonable.  The patient  does continue to have rare episodes of left-sided atypical chest  tightness similar to in the past, it has happened about once since she  last saw me.   PAST MEDICAL HISTORY:  1. Hyperparathyroidism.  The patient is status post parathyroidectomy      in March 2009.  2. History of SVT in the setting of hypercalcemia.  3. Lumbar spine disease.  The patient has lumbar spondylosis, lumbar      degenerative disk disease, radiculopathy with pain down her right      leg.  4. Cardiac MRI in March 2009.  This was done to evaluate for source of      SVT.  There is no evidence for infiltrative disease.  EF was 58%.  5. Gastroesophageal reflux disease.  6.  Adenosine Myoview done in January 2010, EF was 65%.  There was some      evidence for some breast attenuation.  No evidence for ischemia.  7. Holter monitor done in January 2010, showed occasional PAC.  There      were no runs of SVT.   SOCIAL HISTORY:  The patient has never smoked.  She does not drink  alcohol and does not use any drugs.  Lives with her children.  She is  unemployed at this time and is trying to get a disability.   MEDICATIONS:  1. Omeprazole 20 mg daily.  2. Lasix 40 mg daily.  3. Meloxicam 15 mg daily.  4. Tramadol.  5. Gabapentin 100 mg daily.   PHYSICAL EXAMINATION:  VITAL SIGNS:  Blood pressure is 100/75, heart  rate is 86 and regular.  GENERAL:  This is a well-developed female in no apparent distress.  NEUROLOGIC:  Alert and oriented x3.  Normal  affect.  LUNGS:  Clear to auscultation bilaterally.  Normal respiratory effort.  CARDIOVASCULAR:  Heart is regular.  S1 and S2.  S2 widely split.  There  is no S3.  There is no S4.  There is a soft 1/6 crescendo-decrescendo  right upper sternal border ejection murmur.  ABDOMEN:  Soft and nontender.  No hepatosplenomegaly.  Normal bowel  sounds.  EXTREMITIES:  There is no clubbing or cyanosis.  There is no peripheral  edema, 2+ posterior tibial pulses bilaterally.   ASSESSMENT AND PLAN:  This is a 60 year old with a history of lumbar  degenerative disk disease and supraventricular tachycardia who has had  episodes of atypical chest pain and comes back to Cardiology Clinic  after testing prior to surgery.  1. Chest pain.  The patient's pain is atypical.  She is not a very      good historian, but it does not seem to be exertional.  It happens      infrequently.  Her adenosine Myoview was low risk with no evidence      for ischemia.  The patient also had structurally normal heart by      echocardiogram in March 2009 and also cardiac MRI in March 2009 as      well.  I do not think that there is any further cardiac  testing the      patient needs prior to surgery.  She does not appear to be at high      risk for ischemia, and I do think it is reasonable to go ahead with      her surgery.  2. Lipids.  The patient's LDL is 108 and her HDL is 44.0, which is      reasonable for her.  3. History of supraventricular tachycardia.  We did do a Holter      monitor.  There is no evidence for any significant arrhythmia.  In      the setting of surgery, she will have a higher level of stress and      greater risk for supraventricular tachycardia episodes.  Therefore,      both for perioperative heart protection and prophylaxis for      supraventricular tachycardia, I do think it is reasonable to have      her on a low dose of beta-blocker perioperatively.  We will start      her on Toprol-XL 25 mg a day.  We will call her in about a week and      see what her blood pressure has been running at home to make sure      that it does not run too low on this medication.     Marca Ancona, MD  Electronically Signed    DM/MedQ  DD: 10/15/2008  DT: 10/16/2008  Job #: [id number]   cc:   Buckner Malta, M.D.

## 2011-02-02 NOTE — H&P (Signed)
NAME:  Madison Miller, Madison Miller           ACCOUNT NO.:  [id number]   MEDICAL RECORD NO.:  [id number]          PATIENT TYPE:  EMS   LOCATION:  MAJO                         FACILITY:  [facility]   PHYSICIAN:  Lonia Blood, M.D.       DATE OF BIRTH:  09/15/1952   DATE OF ADMISSION:  11/22/2007  DATE OF DISCHARGE:                              HISTORY & PHYSICAL   PRIMARY CARE PHYSICIAN:  None.   CHIEF COMPLAINT:  Back pain and chest pain.   HISTORY OF PRESENT ILLNESS:  Madison Miller is a 60 year old woman  without any significant past medical history who presented today to the  Urgent Care Center to have her back pain checked out.  She reports that  she has been having some chronic low back pain, but recently it has been  getting worse to the point where it is now radiating to the whole right  lower extremity without any relief.  While she was in the Urgent Care  getting evaluated for her back pain, she developed chest pain and  palpitations, and she was transferred over to the emergency room.  Upon  further questioning, the patient reports that she has been having brief  episodes of chest pain on and off for the past 3 days, intermittent  episodes associated with palpitations and shortness of breath.  The  chest pain gets 10/10 at its worse, and there is nothing that helps it.  The pain sort of subsides on its own.   PAST MEDICAL HISTORY:  Hypertension.   SOCIAL HISTORY:  The patient has six children.  She is married.  She  does not smoke cigarettes, she does not drink alcohol.  She works part-  time in a Veterinary surgeon.   ALLERGIES:  NO KNOWN DRUG ALLERGIES.   MEDICATIONS:  None.   REVIEW OF SYSTEMS:  As per HPI.  All other systems negative.   PHYSICAL EXAMINATION ON ADMISSION:  VITAL SIGNS:  Temperature 97.1,  pulse 85, respiration 16, blood pressure 140/100.  GENERAL:  Well-developed, well-nourished, in no acute distress.  Alert  and oriented place, person, and time.  HEENT:   Normocephalic, atraumatic.  Eyes:  Pupils equal, round, reactive  to light and accommodation.Extraocular movements  intact.  Throat clear.  NECK:  Supple.  No JVD.  CHEST:  Clear without wheeze, rhonchi, or crackles.  HEART:  Regular rate and rhythm without murmurs, rubs, or gallop.  ABDOMEN:  Soft, nontender, nondistended.  Bowel sounds are present.  EXTREMITIES:  Lower Extremities: Without edema.  SKIN:  Dry.  NEUROLOGIC:  Positive for the straight leg raise test.  Otherwise, there  is decreased sensation in the lateral aspect of the shin.  Deep tendon  reflexes seem to be symmetric.   LABORATORY DATA ON ADMISSION:  White blood cell count 7.2, hemoglobin  12.6, platelet count 233.  Sodium 140, potassium 3.7, chloride 108, BUN  18, creatinine 0.9, glucose 105.   CT scan of the spine shows significant degenerative changes at L5-S1  with 7-mm anterolisthesis resulting in spinal stenosis and bilateral  nephrolithiasis.  Rhythm strip from the Urgent Care Center indicates a  narrow complex regular tachycardia with a rate of about 150.  EKG in the  emergency room shows a left anterior fascicular block and right bundle  branch block.   ASSESSMENT AND PLAN:  54. A 60 year old woman with episodes of chest pain and tachycardia,      worrisome for possibility of ischemia.  We will also explore other      possibilities, especially thyroid disease by measuring a TSH.  Ms.      Miller will be admitted for observation.  Telemetry will be      done throughout the day.  Cardiac markers will be ordered.      Cardiologic consultation will be sought in the morning.  2. Spinal stenosis with sciatica.  For now, symptomatic treatment with      pain relievers is the way to proceed.  Tomorrow, a consideration      will be given to neurosurgical consultation.      Lonia Blood, M.D.  Electronically Signed     SL/MEDQ  D:  11/23/2007  T:  11/23/2007  Job:  [id number]

## 2011-06-14 LAB — I-STAT 8, (EC8 V) (CONVERTED LAB)
BUN: 18
Chloride: 108
Glucose, Bld: 105 — ABNORMAL HIGH
HCT: 40
Operator id: 234501
pCO2, Ven: 44.8 — ABNORMAL LOW
pH, Ven: 7.351 — ABNORMAL HIGH

## 2011-06-14 LAB — TROPONIN I: Troponin I: 0.01

## 2011-06-14 LAB — BASIC METABOLIC PANEL
BUN: 13
BUN: 9
CO2: 26
CO2: 27
CO2: 32
Calcium: 10.8 — ABNORMAL HIGH
Calcium: 12.1 — ABNORMAL HIGH
Calcium: 12.3 — ABNORMAL HIGH
Calcium: 13.5
Chloride: 105
Chloride: 106
Chloride: 108
Chloride: 108
Chloride: 108
Creatinine, Ser: 0.81
Creatinine, Ser: 0.97
GFR calc Af Amer: >60
GFR calc Af Amer: >60
GFR calc Af Amer: >60
GFR calc Af Amer: >60
GFR calc Af Amer: >60
GFR calc Af Amer: >60
GFR calc non Af Amer: >60
GFR calc non Af Amer: >60
GFR calc non Af Amer: >60
GFR calc non Af Amer: >60
GFR calc non Af Amer: >60
Glucose, Bld: 86
Glucose, Bld: 86
Glucose, Bld: 88
Glucose, Bld: 99
Potassium: 3.5
Potassium: 3.5
Potassium: 3.5
Potassium: 3.6
Potassium: 3.6
Potassium: 4
Potassium: 4.3
Potassium: 4.3
Sodium: 138
Sodium: 138
Sodium: 139
Sodium: 140
Sodium: 142
Sodium: 142
Sodium: 142

## 2011-06-14 LAB — COMPREHENSIVE METABOLIC PANEL
ALT: 12
Calcium: 11.7 — ABNORMAL HIGH
Glucose, Bld: 95
Sodium: 139
Total Protein: 6.5

## 2011-06-14 LAB — CBC
MCHC: 33.6
MCHC: 34
MCV: 92.8
Platelets: 220
Platelets: 233
RBC: 4
RDW: 13.9
WBC: 6.2
WBC: 7.3

## 2011-06-14 LAB — URINALYSIS, ROUTINE W REFLEX MICROSCOPIC
Bilirubin Urine: NEGATIVE
Nitrite: NEGATIVE
Specific Gravity, Urine: 1.018
Urobilinogen, UA: 1

## 2011-06-14 LAB — PTH, INTACT AND CALCIUM: PTH: 260.3 — ABNORMAL HIGH

## 2011-06-14 LAB — DIFFERENTIAL
Basophils Absolute: 0
Basophils Relative: 0
Eosinophils Absolute: 0
Neutro Abs: 4.5
Neutrophils Relative %: 62

## 2011-06-14 LAB — HEMOGLOBIN A1C
Hgb A1c MFr Bld: 5
Mean Plasma Glucose: 101

## 2011-06-14 LAB — VITAMIN D 1,25 DIHYDROXY: Vit D, 1,25-Dihydroxy: 65 pg/mL — ABNORMAL HIGH (ref 6–62)

## 2011-06-14 LAB — CK TOTAL AND CKMB (NOT AT ARMC)
CK, MB: 1.3
CK, MB: 2.3
Relative Index: INVALID
Total CK: 107
Total CK: 72
Total CK: 79

## 2011-06-14 LAB — LIPID PANEL
Total CHOL/HDL Ratio: 3
VLDL: 13

## 2011-06-14 LAB — TSH: TSH: 1.387

## 2011-06-14 LAB — POCT CARDIAC MARKERS
Myoglobin, poc: 68.7
Operator id: 234501

## 2011-06-14 LAB — RAPID URINE DRUG SCREEN, HOSP PERFORMED
Amphetamines: NOT DETECTED
Barbiturates: NOT DETECTED

## 2011-06-14 LAB — DHEA-SULFATE: DHEA-SO4: 128

## 2011-06-14 LAB — ANGIOTENSIN CONVERTING ENZYME: Angiotensin-Converting Enzyme: 22 U/L (ref 9–67)

## 2011-06-14 LAB — CALCIUM, IONIZED: Calcium, Ion: 1.72 — ABNORMAL HIGH

## 2011-06-14 LAB — D-DIMER, QUANTITATIVE: D-Dimer, Quant: 0.78 — ABNORMAL HIGH

## 2011-06-14 LAB — SEDIMENTATION RATE: Sed Rate: 15

## 2011-06-14 LAB — URINE MICROSCOPIC-ADD ON

## 2011-06-14 LAB — 17-HYDROXYPROGESTERONE: 17-OH-Progesterone, LC/MS/MS: 18 ng/dL

## 2011-06-14 LAB — POCT I-STAT CREATININE
Creatinine, Ser: 0.9
Operator id: 234501

## 2011-06-14 LAB — PROLACTIN: Prolactin: 4.4

## 2011-06-17 LAB — CBC
HCT: 36.8
Hemoglobin: 12.8
MCHC: 34.8
RBC: 3.9
RDW: 13.6

## 2011-06-17 LAB — URINALYSIS, ROUTINE W REFLEX MICROSCOPIC
Bilirubin Urine: NEGATIVE
Ketones, ur: NEGATIVE
Nitrite: NEGATIVE
Protein, ur: NEGATIVE
Urobilinogen, UA: 1

## 2011-06-17 LAB — COMPREHENSIVE METABOLIC PANEL
ALT: 15
Alkaline Phosphatase: 52
BUN: 17
CO2: 33 — ABNORMAL HIGH
Calcium: 11.8 — ABNORMAL HIGH
GFR calc non Af Amer: 55 — ABNORMAL LOW
Glucose, Bld: 82
Sodium: 143
Total Protein: 7

## 2011-06-17 LAB — DIFFERENTIAL
Basophils Relative: 2 — ABNORMAL HIGH
Eosinophils Absolute: 0
Lymphs Abs: 1.7
Neutro Abs: 5.1
Neutrophils Relative %: 69

## 2011-06-17 LAB — PROTIME-INR
INR: 0.9
Prothrombin Time: 12.2

## 2011-11-10 DIAGNOSIS — J019 Acute sinusitis, unspecified: Secondary | ICD-10-CM | POA: Diagnosis not present

## 2011-12-07 DIAGNOSIS — J209 Acute bronchitis, unspecified: Secondary | ICD-10-CM | POA: Diagnosis not present

## 2011-12-31 DIAGNOSIS — IMO0002 Reserved for concepts with insufficient information to code with codable children: Secondary | ICD-10-CM | POA: Diagnosis not present

## 2012-01-05 ENCOUNTER — Encounter: Payer: Self-pay | Admitting: *Deleted

## 2012-01-20 ENCOUNTER — Ambulatory Visit: Payer: Self-pay | Admitting: Cardiology

## 2012-01-21 ENCOUNTER — Ambulatory Visit (INDEPENDENT_AMBULATORY_CARE_PROVIDER_SITE_OTHER): Payer: Medicare Other | Admitting: Cardiology

## 2012-01-21 ENCOUNTER — Encounter: Payer: Self-pay | Admitting: *Deleted

## 2012-01-21 ENCOUNTER — Encounter: Payer: Self-pay | Admitting: Cardiology

## 2012-01-21 DIAGNOSIS — R002 Palpitations: Secondary | ICD-10-CM

## 2012-01-21 DIAGNOSIS — R079 Chest pain, unspecified: Secondary | ICD-10-CM

## 2012-01-21 DIAGNOSIS — I739 Peripheral vascular disease, unspecified: Secondary | ICD-10-CM

## 2012-01-21 MED ORDER — METOPROLOL SUCCINATE ER 25 MG PO TB24: 25.0000 mg | ORAL_TABLET | Freq: Every day | ORAL | Status: DC

## 2012-01-21 NOTE — Progress Notes (Signed)
PCP: Dr. Lacie Scotts  61 yo with history of SVT and atypical chest pain presents for followup.  She has had only rare palpitations, no syncope or lightheadedness on Toprol XL.  She continues to have episodes of chest pain.  These occur 1-2 times a week, lasting a few minutes to an hour.  The pain is not related to exertion or meals.  She had an adenosine myoview in 1/10 with breast attenuation but it was thought to be overall normal.  Patient also reports pain in her calves bilaterally with ambulation, even walking around her house.  She is concerned that she may have PAD.    ECG: NSR, RBBB  Allergies (verified):  No Known Drug Allergies   Past Medical History:  1. Hyperparathyroidism. The patient is status post parathyroidectomy in March 2009.  2. History of SVT in the setting of hypercalcemia. Holter monitor done in January 2010, showed occasional PAC. There were no runs of SVT.  3. Lumbar spine disease. The patient has lumbar spondylosis, lumbar degenerative disk disease, radiculopathy with pain down her right leg. s/p laminectomy in 2010.  4. Cardiac MRI in March 2009. This was done to evaluate for source of SVT. There is no evidence for infiltrative disease. EF was 58%.  5. Gastroesophageal reflux disease.  6. Chest pain. Adenosine Myoview done in January 2010, EF was 65%. There was some evidence for some breast attenuation. No evidence for ischemia.  7. RBB W/ LFAB (ICD-426.52)  8. ALLERGIC RHINITIS, SEASONAL (ICD-477.0)  9. MIGRAINE NOS W/INTRACTABLE MIGRAINE (ICD-346.91)  10. nephrolithiasis   Family History:  does not know much fam hx -- ? not in contact  Father:  CVA or Stroke:  Mother:  brain tumor   Social History:  unemployed, disabled  Tobacco Use - No.  Alcohol Use - no  Drug Use - no  Married  Six children   Review of Systems  All systems reviewed and negative except as per HPI.   Current Outpatient Prescriptions  Medication Sig Dispense Refill  . alendronate  (FOSAMAX) 35 MG tablet Take 35 mg by mouth every 7 (seven) days. Take with a full glass of water on an empty stomach.      Marland Kitchen aspirin 81 MG tablet Take 81 mg by mouth daily.      . cholecalciferol (VITAMIN D) 1000 UNITS tablet Take 1,000 Units by mouth daily.      . furosemide (LASIX) 40 MG tablet Take 40 mg by mouth daily.      . meloxicam (MOBIC) 7.5 MG tablet Take 7.5 mg by mouth daily.      . metoprolol succinate (TOPROL-XL) 25 MG 24 hr tablet Take 1 tablet (25 mg total) by mouth daily. Take 1/2 tab daily  30 tablet  6  . cyclobenzaprine (FLEXERIL) 10 MG tablet Take 10 mg by mouth 3 (three) times daily as needed. Take 1-1/2 tab tid as needed      . fluticasone (FLONASE) 50 MCG/ACT nasal spray Place 2 sprays into the nose daily.      Marland Kitchen gabapentin (NEURONTIN) 100 MG capsule Take 100 mg by mouth 3 (three) times daily.      . hydrocodone-acetaminophen (LORCET-HD) 5-500 MG per capsule Take 1 capsule by mouth 3 (three) times daily as needed.      Marland Kitchen omeprazole (PRILOSEC) 20 MG capsule Take 20 mg by mouth 2 (two) times daily.      . traMADol (ULTRAM) 50 MG tablet Take 50 mg by mouth every 6 (six) hours  as needed. Take 1-2 tabs up to tid as needed        BP 127/83  Pulse 64  Resp 18  Ht 5\' 5"  (1.651 m)  Wt 139 lb 1.9 oz (63.104 kg)  BMI 23.15 kg/m2 General: NAD, thin Neck: No JVD, no thyromegaly or thyroid nodule.  Lungs: Clear to auscultation bilaterally with normal respiratory effort. CV: Nondisplaced PMI.  Heart regular S1/S2, no S3/S4, widely split S2, no murmur.  No peripheral edema.  No carotid bruit.  Unable to palpate pedal pulses (PT and DP).  Abdomen: Soft, nontender, no hepatosplenomegaly, no distention.  Neurologic: Alert and oriented x 3.  Psych: Normal affect. Extremities: No clubbing or cyanosis.

## 2012-01-21 NOTE — Patient Instructions (Signed)
Your physician has requested that you have coronary Ct with calcium scoring. Cardiac computed tomography (CT) is a painless test that uses an x-ray machine to take clear, detailed pictures of your heart. For further information please visit [email or web address] Please follow instruction sheet as given.  The morning of your test take 25mg  of Toprol XL  Your physician has requested that you have a lower  extremity arterial duplex. This test is an ultrasound of the arteries in the legs or arms. It looks at arterial blood flow in the legs and arms. Allow one hour for Lower and Upper Arterial scans. There are no restrictions or special instructions  Your physician recommends that you return for lab work on the same day as your lower arterial doppler for a fasting lipid level  Your physician wants you to follow-up in: 1 year with Dr. Shirlee Latch. You will receive a reminder letter in the mail two months in advance. If you don't receive a letter, please call our office to schedule the follow-up appointment.

## 2012-01-23 DIAGNOSIS — R079 Chest pain, unspecified: Secondary | ICD-10-CM | POA: Insufficient documentation

## 2012-01-23 DIAGNOSIS — I739 Peripheral vascular disease, unspecified: Secondary | ICD-10-CM | POA: Insufficient documentation

## 2012-01-23 NOTE — Assessment & Plan Note (Signed)
H/o SVT.  Seems to be doing well on Toprol XL with minimal palpitations.

## 2012-01-23 NOTE — Assessment & Plan Note (Signed)
Patient has calf pain that could be consistent with claudication.  I have a difficult time palpating her pedal pulses.  I will get peripheral arterial dopplers to assess.    I will check lipids/LFTs.

## 2012-01-23 NOTE — Assessment & Plan Note (Signed)
Long history of atypical chest pain.  Increasing frequency and severity currently.  Not exertional.  She had an adenosine myoview in 2010 with probable breast attenuation.  The study was thought to be normal.  Given ongoing chest pain and breast attenuation on prior myoview, I will get a coronary CT angiogram to look directly at the coronaries.

## 2012-02-01 ENCOUNTER — Ambulatory Visit (HOSPITAL_COMMUNITY)
Admission: RE | Admit: 2012-02-01 | Discharge: 2012-02-01 | Disposition: A | Discharge status: Home / Self Care | Payer: Medicare Other | Source: Ambulatory Visit | Attending: Cardiology | Admitting: Cardiology

## 2012-02-01 ENCOUNTER — Encounter (INDEPENDENT_AMBULATORY_CARE_PROVIDER_SITE_OTHER): Payer: Medicare Other

## 2012-02-01 DIAGNOSIS — I739 Peripheral vascular disease, unspecified: Secondary | ICD-10-CM

## 2012-02-01 DIAGNOSIS — R079 Chest pain, unspecified: Secondary | ICD-10-CM | POA: Diagnosis not present

## 2012-02-04 DIAGNOSIS — H2510 Age-related nuclear cataract, unspecified eye: Secondary | ICD-10-CM | POA: Diagnosis not present

## 2012-10-19 DIAGNOSIS — M129 Arthropathy, unspecified: Secondary | ICD-10-CM | POA: Diagnosis not present

## 2012-10-19 DIAGNOSIS — R109 Unspecified abdominal pain: Secondary | ICD-10-CM | POA: Diagnosis not present

## 2012-10-19 DIAGNOSIS — E7800 Pure hypercholesterolemia, unspecified: Secondary | ICD-10-CM | POA: Diagnosis not present

## 2012-10-19 DIAGNOSIS — R5381 Other malaise: Secondary | ICD-10-CM | POA: Diagnosis not present

## 2012-10-19 DIAGNOSIS — R5383 Other fatigue: Secondary | ICD-10-CM | POA: Diagnosis not present

## 2012-10-19 DIAGNOSIS — M545 Low back pain, unspecified: Secondary | ICD-10-CM | POA: Diagnosis not present

## 2012-10-19 DIAGNOSIS — M542 Cervicalgia: Secondary | ICD-10-CM | POA: Diagnosis not present

## 2012-10-19 DIAGNOSIS — I10 Essential (primary) hypertension: Secondary | ICD-10-CM | POA: Diagnosis not present

## 2012-10-19 DIAGNOSIS — M810 Age-related osteoporosis without current pathological fracture: Secondary | ICD-10-CM | POA: Diagnosis not present

## 2012-10-26 DIAGNOSIS — G541 Lumbosacral plexus disorders: Secondary | ICD-10-CM | POA: Diagnosis not present

## 2012-10-26 DIAGNOSIS — E7800 Pure hypercholesterolemia, unspecified: Secondary | ICD-10-CM | POA: Diagnosis not present

## 2012-11-08 DIAGNOSIS — I517 Cardiomegaly: Secondary | ICD-10-CM | POA: Diagnosis not present

## 2012-11-08 DIAGNOSIS — R011 Cardiac murmur, unspecified: Secondary | ICD-10-CM | POA: Diagnosis not present

## 2012-11-08 DIAGNOSIS — I6529 Occlusion and stenosis of unspecified carotid artery: Secondary | ICD-10-CM | POA: Diagnosis not present

## 2012-11-08 DIAGNOSIS — R079 Chest pain, unspecified: Secondary | ICD-10-CM | POA: Diagnosis not present

## 2012-11-08 DIAGNOSIS — I669 Occlusion and stenosis of unspecified cerebral artery: Secondary | ICD-10-CM | POA: Diagnosis not present

## 2012-11-29 LAB — PULMONARY FUNCTION TEST

## 2012-12-21 ENCOUNTER — Emergency Department (HOSPITAL_COMMUNITY)
Admission: EM | Admit: 2012-12-21 | Discharge: 2012-12-21 | Disposition: A | Discharge status: Home / Self Care | Payer: Medicare Other | Attending: Emergency Medicine | Admitting: Emergency Medicine

## 2012-12-21 ENCOUNTER — Encounter (HOSPITAL_COMMUNITY): Payer: Self-pay

## 2012-12-21 DIAGNOSIS — Z8639 Personal history of other endocrine, nutritional and metabolic disease: Secondary | ICD-10-CM | POA: Insufficient documentation

## 2012-12-21 DIAGNOSIS — Z8679 Personal history of other diseases of the circulatory system: Secondary | ICD-10-CM | POA: Diagnosis not present

## 2012-12-21 DIAGNOSIS — R42 Dizziness and giddiness: Secondary | ICD-10-CM | POA: Diagnosis not present

## 2012-12-21 DIAGNOSIS — Z79899 Other long term (current) drug therapy: Secondary | ICD-10-CM | POA: Insufficient documentation

## 2012-12-21 DIAGNOSIS — Z8709 Personal history of other diseases of the respiratory system: Secondary | ICD-10-CM | POA: Diagnosis not present

## 2012-12-21 DIAGNOSIS — Z7982 Long term (current) use of aspirin: Secondary | ICD-10-CM | POA: Insufficient documentation

## 2012-12-21 DIAGNOSIS — G43909 Migraine, unspecified, not intractable, without status migrainosus: Secondary | ICD-10-CM | POA: Diagnosis not present

## 2012-12-21 DIAGNOSIS — Z8719 Personal history of other diseases of the digestive system: Secondary | ICD-10-CM | POA: Insufficient documentation

## 2012-12-21 DIAGNOSIS — Z8739 Personal history of other diseases of the musculoskeletal system and connective tissue: Secondary | ICD-10-CM | POA: Insufficient documentation

## 2012-12-21 DIAGNOSIS — Z862 Personal history of diseases of the blood and blood-forming organs and certain disorders involving the immune mechanism: Secondary | ICD-10-CM | POA: Insufficient documentation

## 2012-12-21 DIAGNOSIS — Z87442 Personal history of urinary calculi: Secondary | ICD-10-CM | POA: Diagnosis not present

## 2012-12-21 MED ORDER — KETOROLAC TROMETHAMINE 60 MG/2ML IM SOLN
30.0000 mg | Freq: Once | INTRAMUSCULAR | Status: AC
  Administered 2012-12-21: 30 mg via INTRAMUSCULAR
  Filled 2012-12-21: qty 2

## 2012-12-21 MED ORDER — METOCLOPRAMIDE HCL 5 MG/ML IJ SOLN
10.0000 mg | Freq: Once | INTRAMUSCULAR | Status: AC
  Administered 2012-12-21: 10 mg via INTRAMUSCULAR
  Filled 2012-12-21: qty 2

## 2012-12-21 MED ORDER — DIPHENHYDRAMINE HCL 50 MG/ML IJ SOLN
25.0000 mg | Freq: Once | INTRAMUSCULAR | Status: AC
  Administered 2012-12-21: 25 mg via INTRAMUSCULAR
  Filled 2012-12-21: qty 1

## 2012-12-21 NOTE — ED Provider Notes (Signed)
History     CSN: [id number]  Arrival date & time 12/21/12  1251   First MD Initiated Contact with Patient 12/21/12 1420      Chief Complaint  Patient presents with  . Headache    (Consider location/radiation/quality/duration/timing/severity/associated sxs/prior treatment) HPI Comments: The ER for evaluation of headache. Patient has a history of chronic and recurrent migraine headaches. Patient reports that she has a headache similar to previous migraines but it did not respond to Fioricet. She reports that sometimes her headaches do not respond, this is not unusual. She does have an unusual symptom of mild dizziness. There is no vertigo. Patient has not had any fever. There is no neck pain or stiffness.  Patient is a 62 y.o. female presenting with headaches.  Headache Associated symptoms: dizziness     Past Medical History  Diagnosis Date  . Hyperparathyroidism   . SVT (supraventricular tachycardia)   . PAC (premature atrial contraction)   . Degenerative joint disease of cervical and lumbar spine   . Spondylosis   . Degenerative lumbar disc   . Radiculopathy   . Cardiac LV ejection fraction >40%   . GERD (gastroesophageal reflux disease)   . Chest pain   . Right bundle branch block and left anterior fascicular block   . Allergic rhinitis due to pollen   . Migraine, unspecified, with intractable migraine, so stated, without mention of status migrainosus   . Nephrolithiasis     Past Surgical History  Procedure Laterality Date  . Parathyroidectomy  2009    Family History  Problem Relation Age of Onset  . Cancer Mother   . Stroke Father     cva    History  Substance Use Topics  . Smoking status: Never Smoker   . Smokeless tobacco: Not on file  . Alcohol Use: No    OB History   Grav Para Term Preterm Abortions TAB SAB Ect Mult Living                  Review of Systems  Neurological: Positive for dizziness and headaches.  All other systems reviewed and are  negative.    Allergies  Review of patient's allergies indicates no known allergies.  Home Medications   Current Outpatient Rx  Name  Route  Sig  Dispense  Refill  . aspirin 81 MG tablet   Oral   Take 81 mg by mouth daily.         . cholecalciferol (VITAMIN D) 1000 UNITS tablet   Oral   Take 1,000 Units by mouth daily.         . furosemide (LASIX) 40 MG tablet   Oral   Take 40 mg by mouth daily.         . metoprolol succinate (TOPROL-XL) 25 MG 24 hr tablet   Oral   Take 1 tablet (25 mg total) by mouth daily. Take 1/2 tab daily   30 tablet   6   . potassium chloride (K-DUR,KLOR-CON) 10 MEQ tablet   Oral   Take 10 mEq by mouth 2 (two) times daily.         . traMADol (ULTRAM) 50 MG tablet   Oral   Take 50 mg by mouth every 6 (six) hours as needed for pain. Take 1-2 tabs up to tid as needed           BP 136/98  Pulse 58  Temp(Src) 97.7 F (36.5 C) (Oral)  Resp 16  SpO2 99%  Physical Exam  Constitutional: She is oriented to person, place, and time. She appears well-developed and well-nourished. No distress.  HENT:  Head: Normocephalic and atraumatic.  Right Ear: Hearing normal.  Nose: Nose normal.  Mouth/Throat: Oropharynx is clear and moist and mucous membranes are normal.  Eyes: Conjunctivae and EOM are normal. Pupils are equal, round, and reactive to light.  Neck: Normal range of motion. Neck supple.  Cardiovascular: Normal rate, regular rhythm, S1 normal and S2 normal.  Exam reveals no gallop and no friction rub.   No murmur heard. Pulmonary/Chest: Effort normal and breath sounds normal. No respiratory distress. She exhibits no tenderness.  Abdominal: Soft. Normal appearance and bowel sounds are normal. There is no hepatosplenomegaly. There is no tenderness. There is no rebound, no guarding, no tenderness at McBurney's point and negative Murphy's sign. No hernia.  Musculoskeletal: Normal range of motion.  Neurological: She is alert and oriented to  person, place, and time. She has normal strength. No cranial nerve deficit or sensory deficit. Coordination normal. GCS eye subscore is 4. GCS verbal subscore is 5. GCS motor subscore is 6.  Skin: Skin is warm, dry and intact. No rash noted. No cyanosis.  Psychiatric: She has a normal mood and affect. Her speech is normal and behavior is normal. Thought content normal.    ED Course  Procedures (including critical care time)  Labs Reviewed - No data to display No results found.   Diagnosis: Headache, migraine    MDM  This presents to ER with complaints of headache. Patient's headache is similar to migraines that she has had before. She has headaches that are worse in the past. She did not have any response to her normal medications. This is not unusual either. She has an entirely normal neurologic examination. Patient does indicate that she has had some dizziness with headache which is unusual. She is ambulating without difficulty. Orthostatic blood pressures were negative. Based on her previous history of headaches and normal vital signs and workup, it is felt that the patient does not need any further evaluation for this headache. She will be treated and is to follow up with her Dr. Return if her symptoms worsen.        Gilda Crease, MD 12/21/12 [phone]

## 2012-12-21 NOTE — ED Notes (Signed)
Pt presents with HA and has taken medicine but has not helped. Pt feels dizzy and also complains of back pain.

## 2012-12-27 DIAGNOSIS — J4489 Other specified chronic obstructive pulmonary disease: Secondary | ICD-10-CM | POA: Diagnosis not present

## 2012-12-27 DIAGNOSIS — J449 Chronic obstructive pulmonary disease, unspecified: Secondary | ICD-10-CM | POA: Diagnosis not present

## 2012-12-27 DIAGNOSIS — R079 Chest pain, unspecified: Secondary | ICD-10-CM | POA: Diagnosis not present

## 2012-12-27 DIAGNOSIS — Z0000 Encounter for general adult medical examination without abnormal findings: Secondary | ICD-10-CM | POA: Diagnosis not present

## 2012-12-27 DIAGNOSIS — R0602 Shortness of breath: Secondary | ICD-10-CM | POA: Diagnosis not present

## 2013-01-11 DIAGNOSIS — R5381 Other malaise: Secondary | ICD-10-CM | POA: Diagnosis not present

## 2013-01-11 DIAGNOSIS — M810 Age-related osteoporosis without current pathological fracture: Secondary | ICD-10-CM | POA: Diagnosis not present

## 2013-01-30 ENCOUNTER — Encounter: Payer: Self-pay | Admitting: Family Medicine

## 2013-01-30 ENCOUNTER — Encounter: Payer: Self-pay | Admitting: *Deleted

## 2013-01-30 ENCOUNTER — Ambulatory Visit (INDEPENDENT_AMBULATORY_CARE_PROVIDER_SITE_OTHER): Payer: Medicare Other | Admitting: Family Medicine

## 2013-01-30 VITALS — BP 100/62 | HR 60 | Temp 98.1°F | Ht 64.0 in | Wt 135.0 lb

## 2013-01-30 DIAGNOSIS — I452 Bifascicular block: Secondary | ICD-10-CM | POA: Diagnosis not present

## 2013-01-30 DIAGNOSIS — Z136 Encounter for screening for cardiovascular disorders: Secondary | ICD-10-CM | POA: Diagnosis not present

## 2013-01-30 DIAGNOSIS — J322 Chronic ethmoidal sinusitis: Secondary | ICD-10-CM | POA: Insufficient documentation

## 2013-01-30 LAB — COMPREHENSIVE METABOLIC PANEL
ALT: 12 U/L (ref 0–35)
AST: 17 U/L (ref 0–37)
Albumin: 4 g/dL (ref 3.5–5.2)
Alkaline Phosphatase: 40 U/L (ref 39–117)
Calcium: 9.2 mg/dL (ref 8.4–10.5)
Chloride: 106 mEq/L (ref 96–112)
Potassium: 4.4 mEq/L (ref 3.5–5.1)
Sodium: 140 mEq/L (ref 135–145)
Total Protein: 7.6 g/dL (ref 6.0–8.3)

## 2013-01-30 LAB — LIPID PANEL
LDL Cholesterol: 79 mg/dL (ref 0–99)
Total CHOL/HDL Ratio: 3

## 2013-01-30 MED ORDER — AMOXICILLIN 875 MG PO TABS: 875.0000 mg | ORAL_TABLET | Freq: Two times a day (BID) | ORAL | Status: DC

## 2013-01-30 NOTE — Progress Notes (Signed)
Subjective:    Patient ID: Madison Miller, female    DOB: [birth year], 62 y.o.   MRN: [id number]  HPI  62 yo pt here to re establish.  Previously saw Dr. Milinda Antis, then transferred to [facility]. She feels our office is closer and would like to re establish here.  Not sure when she last had blood work. Unsure when she had her last pap smear or mammogram and she does not want to have them done. She thinks she has had a colonoscopy recently.  Complains of sinus pain and pressure.  Given a zpack last month but symptoms persist. Has had intermittent fevers. No cough. No SOB or CP.  She is taking OTC antihistamine ( cannot remember name).  History of SVT and atypical chest pain- followed by Marca Ancona.  Last saw him one year ago.  Has been taking Toprol 25 mg and lasix daily.  Has had no recent CP.  Patient Active Problem List   Diagnosis Date Noted  . Ethmoid sinusitis 01/30/2013  . Chest pain 01/23/2012  . Claudication 01/23/2012  . PALPITATIONS 09/03/2010  . RBB W/ LFAB 10/14/2008  . SPINAL STENOSIS 07/17/2008  . ALLERGIC RHINITIS, SEASONAL 07/03/2008  . BACK PAIN 05/08/2008  . FOOT PAIN, BILATERAL 05/08/2008  . SUPRAVENTRICULAR TACHYCARDIA, HX OF 02/23/2008  . DYSPEPSIA 02/13/2008  . HYPERCALCEMIA 01/11/2008  . BACK PAIN, LUMBAR, WITH RADICULOPATHY 05/19/2007  . MIGRAINE NOS W/INTRACTABLE MIGRAINE 12/15/2006   Past Medical History  Diagnosis Date  . Hyperparathyroidism   . SVT (supraventricular tachycardia)   . PAC (premature atrial contraction)   . Degenerative joint disease of cervical and lumbar spine   . Spondylosis   . Degenerative lumbar disc   . Radiculopathy   . Cardiac LV ejection fraction >40%   . GERD (gastroesophageal reflux disease)   . Chest pain   . Right bundle branch block and left anterior fascicular block   . Allergic rhinitis due to pollen   . Migraine, unspecified, with intractable migraine, so stated, without mention of  status migrainosus   . Nephrolithiasis    Past Surgical History  Procedure Laterality Date  . Parathyroidectomy  2009   History  Substance Use Topics  . Smoking status: Never Smoker   . Smokeless tobacco: Not on file  . Alcohol Use: No   Family History  Problem Relation Age of Onset  . Cancer Mother   . Stroke Father     cva   No Known Allergies Current Outpatient Prescriptions on File Prior to Visit  Medication Sig Dispense Refill  . aspirin 81 MG tablet Take 81 mg by mouth daily.      . cholecalciferol (VITAMIN D) 1000 UNITS tablet Take 1,000 Units by mouth daily.      . furosemide (LASIX) 40 MG tablet Take 40 mg by mouth daily.      . metoprolol succinate (TOPROL-XL) 25 MG 24 hr tablet Take 1 tablet (25 mg total) by mouth daily. Take 1/2 tab daily  30 tablet  6  . potassium chloride (K-DUR,KLOR-CON) 10 MEQ tablet Take 10 mEq by mouth 2 (two) times daily.      . traMADol (ULTRAM) 50 MG tablet Take 50 mg by mouth every 6 (six) hours as needed for pain. Take 1-2 tabs up to tid as needed       No current facility-administered medications on file prior to visit.   The PMH, PSH, Social History, Family History, Medications, and allergies have been reviewed in [facility],  and have been updated if relevant.     Review of Systems See HPI    Objective:   Physical Exam BP 100/62  Pulse 60  Temp(Src) 98.1 F (36.7 C)  Ht 5\' 4"  (1.626 m)  Wt 135 lb (61.236 kg)  BMI 23.16 kg/m2 General: NAD, thin  HEENT: + nasal erythema, ethmoid sinuses TTP, right >left, TMs clear bilaterally Neck: No JVD, no thyromegaly or thyroid nodule.  Lungs: Clear to auscultation bilaterally with normal respiratory effort.  CV: Nondisplaced PMI. Heart regular S1/S2, no S3/S4, widely split S2, no murmur. No peripheral edema. No carotid bruit. Unable to palpate pedal pulses (PT and DP).  Abdomen: Soft, nontender, no hepatosplenomegaly, no distention.  Neurologic: Alert and oriented x 3.  Psych: Normal affect.   Extremities: No clubbing or cyanosis.     Assessment & Plan:  1. RBB W/ LFAB Has been stable, advised follow up with cards as she has not been seen in a year. The patient indicates understanding of these issues and agrees with the plan.  - Comprehensive metabolic panel  2. Screening for ischemic heart disease Awaiting records- check labs today - Lipid Panel  3. Ethmoid sinusitis Given duration and progression of symptoms, will treat for bacterial sinusitis. I suspect macrolide resistance.  Treat with Amoxicillin 875 mg twice daily x 10 days. Also advised nasocort, continued antihistamine. Call or return to clinic prn if these symptoms worsen or fail to improve as anticipated. The patient indicates understanding of these issues and agrees with the plan.

## 2013-01-30 NOTE — Patient Instructions (Addendum)
Nice to meet you. Please take Amoxicillin as directed- 1 tablet twice daily x 10 days. We will call you with your lab results.

## 2013-02-07 ENCOUNTER — Encounter: Payer: Self-pay | Admitting: Family Medicine

## 2013-02-07 ENCOUNTER — Telehealth: Payer: Self-pay

## 2013-02-07 DIAGNOSIS — Z780 Asymptomatic menopausal state: Secondary | ICD-10-CM

## 2013-02-07 DIAGNOSIS — M810 Age-related osteoporosis without current pathological fracture: Secondary | ICD-10-CM

## 2013-02-07 NOTE — Telephone Encounter (Signed)
Left message asking patient to call back

## 2013-02-07 NOTE — Telephone Encounter (Signed)
No lab work would not show any of those.  She would need a CT scan or xray to look for kidney stones, stress test or echo to look at heart and DEXA scan( start at 65) for osteoporosis.

## 2013-02-07 NOTE — Telephone Encounter (Signed)
Pt received letter re; lab results. Pt wants to know if any of the test done will show anythng about osteoporosis,kidney stones or how her heart is doing. Please advise.

## 2013-02-08 NOTE — Telephone Encounter (Signed)
I cannot  order all these without symptoms.  I can order Bone density scan- order placed.  Please have her make an appointment- if she is having any symptoms, we can proceed with xray, etc.

## 2013-02-08 NOTE — Telephone Encounter (Signed)
Advised patient as instructed.  She would like to have all of these tests done.  Advised her that unless she is having problems her insurance wont cover these tests.  She said she has been told before that she has all of these problems.  Please advise.

## 2013-02-08 NOTE — Telephone Encounter (Signed)
Advised patient.  She declined appt at this time.  She does want the bone density, would like to have this done in [place].

## 2013-02-20 ENCOUNTER — Encounter: Payer: Self-pay | Admitting: Family Medicine

## 2013-02-20 ENCOUNTER — Ambulatory Visit: Payer: Self-pay | Admitting: Family Medicine

## 2013-02-20 DIAGNOSIS — N959 Unspecified menopausal and perimenopausal disorder: Secondary | ICD-10-CM | POA: Diagnosis not present

## 2013-02-20 DIAGNOSIS — M810 Age-related osteoporosis without current pathological fracture: Secondary | ICD-10-CM | POA: Diagnosis not present

## 2013-02-21 ENCOUNTER — Encounter: Payer: Self-pay | Admitting: *Deleted

## 2013-02-23 ENCOUNTER — Telehealth: Payer: Self-pay

## 2013-02-23 NOTE — Telephone Encounter (Signed)
Pt got letter that bone density was normal. Pt informed when in hospital few years ago diagnosed with osteoporosis. Does a normal bone density mean she no longer has osteoporosis. Pt also wants to know if she has arthritis.Please advise.

## 2013-02-23 NOTE — Telephone Encounter (Signed)
Advised patient as instructed. 

## 2013-02-23 NOTE — Telephone Encounter (Signed)
Yes it does mean that she no longer has osteoporosis and can stop taking her Atelvia.  We cannot tell if she has arthritis by bone density.

## 2013-03-27 ENCOUNTER — Other Ambulatory Visit: Payer: Self-pay

## 2013-03-27 MED ORDER — POTASSIUM CHLORIDE CRYS ER 10 MEQ PO TBCR: 10.0000 meq | EXTENDED_RELEASE_TABLET | Freq: Two times a day (BID) | ORAL | Status: DC

## 2013-03-27 NOTE — Telephone Encounter (Signed)
Yes her potassium is at goal when we last checked.  Please continue.

## 2013-03-27 NOTE — Telephone Encounter (Signed)
Advised patient to continue potassium and that refill has been sent to [facility].

## 2013-03-27 NOTE — Telephone Encounter (Signed)
Pt has been taking Furosemide 40 mg taking one daily and Potassium Cl 10 meq taking one daily.(med list has pt taking Potassium CL 10 meq one tab bid.) pt is out of Potassium medicine and wants to know if needs to continue taking. Meds were prescribed by previous doctor at [facility]. Pt request cb and if is to continue taking Potassium; needs refill to [facility].

## 2013-04-06 ENCOUNTER — Ambulatory Visit (INDEPENDENT_AMBULATORY_CARE_PROVIDER_SITE_OTHER): Payer: Medicare Other | Admitting: Family Medicine

## 2013-04-06 ENCOUNTER — Telehealth: Payer: Self-pay | Admitting: *Deleted

## 2013-04-06 ENCOUNTER — Encounter: Payer: Self-pay | Admitting: Family Medicine

## 2013-04-06 ENCOUNTER — Telehealth: Payer: Self-pay | Admitting: Family Medicine

## 2013-04-06 VITALS — BP 132/92 | HR 77 | Temp 98.8°F | Ht 64.0 in

## 2013-04-06 DIAGNOSIS — G43919 Migraine, unspecified, intractable, without status migrainosus: Secondary | ICD-10-CM

## 2013-04-06 MED ORDER — PROMETHAZINE HCL 50 MG/ML IJ SOLN
50.0000 mg | Freq: Once | INTRAMUSCULAR | Status: AC
  Administered 2013-04-06: 50 mg via INTRAMUSCULAR

## 2013-04-06 MED ORDER — KETOROLAC TROMETHAMINE 60 MG/2ML IM SOLN
60.0000 mg | Freq: Once | INTRAMUSCULAR | Status: AC
  Administered 2013-04-06: 60 mg via INTRAMUSCULAR

## 2013-04-06 NOTE — Patient Instructions (Addendum)
I think you have a bad migraine today The toradol will help with pain and the phenergan will help with nausea and dizziness Go home and lie down- rest today- when you can get fluids in- start sipping water  Please let us know if not improving  If headache becomes more severe-go to the ER

## 2013-04-06 NOTE — Telephone Encounter (Signed)
Patient Information:  Caller Name: [name]  Phone: [phone]  Patient: Madison Miller, Madison Miller  Gender: Female  DOB: Jan 14, 1951  Age: 62 Years  PCP: [name] [facility])  Office Follow Up:  Does the office need to follow up with this patient?: No  Instructions For The Office: N/A  RN Note:  RN went to schedule an appt for pt and pt has appt scheduled already by office for 1115 today (04/06/13) by the office.  Symptoms  Reason For Call & Symptoms: headache and stomach pain.  Pt states her BP is 152/94; P is 69  Reviewed Health History In EMR: Yes  Reviewed Medications In EMR: Yes  Reviewed Allergies In EMR: Yes  Reviewed Surgeries / Procedures: Yes  Date of Onset of Symptoms: 04/05/2013  Guideline(s) Used:  High Blood Pressure  Disposition Per Guideline:   See Today in Office  Reason For Disposition Reached:   Patient wants to be seen  Advice Given:  N/A  Patient Will Follow Care Advice:  YES

## 2013-04-06 NOTE — Telephone Encounter (Signed)
I will see her today as planned 

## 2013-04-06 NOTE — Progress Notes (Signed)
Subjective:    Patient ID: Madison Miller, female    DOB: September 23, 1950, 62 y.o.   MRN: [id number]  HPI Here with headache and stomach ache and dizzy with vomiting  Here with her daughter who states they both suffer with migraine life long   Headache started yesterday  She had been out in the sun  Per her daughter- she has had severe headaches in the past - brought on by sun exposure  Usually medicine/ cold towel/ ice /sleep make it better  Does not usually vomit with her headaches -but does get nausea Usually no aura or vision change  Top of head and to the left is area of pain and is throbbing-worse with movement   Stomach hurts today  In middle of her abdomen - "just hurts"= cannot qualify - helped to vomit Vomited 3 times today - since headache started   Dizzy- at times feels like the room is spinning - this also happens with her migraines   No medicines for this episode   Has fioricet on her list -has not taken it yet  Few mo ago -had one bad enough to go er- and given a shot of toradol and also benadryl- it helped but did not get rid of headache fully  Patient Active Problem List   Diagnosis Date Noted  . Ethmoid sinusitis 01/30/2013  . Chest pain 01/23/2012  . Claudication 01/23/2012  . PALPITATIONS 09/03/2010  . RBB W/ LFAB 10/14/2008  . SPINAL STENOSIS 07/17/2008  . ALLERGIC RHINITIS, SEASONAL 07/03/2008  . BACK PAIN 05/08/2008  . FOOT PAIN, BILATERAL 05/08/2008  . SUPRAVENTRICULAR TACHYCARDIA, HX OF 02/23/2008  . DYSPEPSIA 02/13/2008  . HYPERCALCEMIA 01/11/2008  . BACK PAIN, LUMBAR, WITH RADICULOPATHY 05/19/2007  . MIGRAINE NOS W/INTRACTABLE MIGRAINE 12/15/2006   Past Medical History  Diagnosis Date  . Hyperparathyroidism   . SVT (supraventricular tachycardia)   . PAC (premature atrial contraction)   . Degenerative joint disease of cervical and lumbar spine   . Spondylosis   . Degenerative lumbar disc   . Radiculopathy   . Cardiac LV ejection  fraction >40%   . GERD (gastroesophageal reflux disease)   . Chest pain   . Right bundle branch block and left anterior fascicular block   . Allergic rhinitis due to pollen   . Migraine, unspecified, with intractable migraine, so stated, without mention of status migrainosus   . Nephrolithiasis    Past Surgical History  Procedure Laterality Date  . Parathyroidectomy  2009   History  Substance Use Topics  . Smoking status: Never Smoker   . Smokeless tobacco: Not on file  . Alcohol Use: No   Family History  Problem Relation Age of Onset  . Cancer Mother   . Stroke Father     cva   No Known Allergies Current Outpatient Prescriptions on File Prior to Visit  Medication Sig Dispense Refill  . aspirin 81 MG tablet Take 81 mg by mouth daily.      . butalbital-acetaminophen-caffeine (FIORICET) 50-325-40 MG per tablet Take 1 tablet by mouth 2 (two) times daily as needed for headache.      . cholecalciferol (VITAMIN D) 1000 UNITS tablet Take 1,000 Units by mouth daily.      . furosemide (LASIX) 40 MG tablet Take 40 mg by mouth daily.      Marland Kitchen lisinopril (PRINIVIL,ZESTRIL) 10 MG tablet Take 10 mg by mouth daily.      . metoprolol succinate (TOPROL-XL) 25 MG 24 hr tablet Take  1 tablet (25 mg total) by mouth daily. Take 1/2 tab daily  30 tablet  6  . potassium chloride (K-DUR,KLOR-CON) 10 MEQ tablet Take 1 tablet (10 mEq total) by mouth 2 (two) times daily.  30 tablet  6  . Risedronate Sodium (ATELVIA) 35 MG TBEC Take one by mouth weekly      . traMADol (ULTRAM) 50 MG tablet Take 50 mg by mouth every 6 (six) hours as needed for pain. Take 1-2 tabs up to tid as needed       No current facility-administered medications on file prior to visit.    Review of Systems Review of Systems  Constitutional: Negative for fever, appetite change,  and unexpected weight change.  Eyes: Negative for pain and visual disturbance.  ENT neg for sinus pain or ST Respiratory: Negative for cough and shortness of  breath.   Cardiovascular: Negative for cp or palpitations    Gastrointestinal: Negative for , diarrhea and constipation. pos for n/v Genitourinary: Negative for urgency and frequency.  Skin: Negative for pallor or rash   Neurological: Negative for weakness, light-headedness, numbness and speech problem or facial droop Hematological: Negative for adenopathy. Does not bruise/bleed easily.  Psychiatric/Behavioral: Negative for dysphoric mood. The patient is not nervous/anxious.         Objective:   Physical Exam  Constitutional: She appears well-developed and well-nourished.  Female in wheelchair holding basin and occ vomiting    HENT:  Head: Normocephalic and atraumatic.  Mouth/Throat: Oropharynx is clear and moist.  No facial tenderness   Eyes: Conjunctivae and EOM are normal. Pupils are equal, round, and reactive to light. Right eye exhibits no discharge. Left eye exhibits no discharge. No scleral icterus.  No nystagmus noted   Neck: Normal range of motion. Neck supple. No JVD present. Carotid bruit is not present.  Cardiovascular: Normal rate, regular rhythm and normal heart sounds.  Exam reveals no gallop.   No murmur heard. Pulmonary/Chest: Effort normal and breath sounds normal. No respiratory distress. She has no wheezes. She has no rales.  Abdominal: Soft. Bowel sounds are normal. She exhibits no distension. There is no tenderness.  Exam done sitting  Lymphadenopathy:    She has no cervical adenopathy.  Neurological: She is alert. She has normal reflexes. She displays no tremor. No cranial nerve deficit or sensory deficit. She exhibits normal muscle tone. Coordination normal.  Skin: Skin is warm and dry. No pallor.  Nl color and turgor  Psychiatric: She has a normal mood and affect.          Assessment & Plan:

## 2013-04-06 NOTE — Telephone Encounter (Signed)
Pt advise to take her fiorcet and if no improvement go to ER

## 2013-04-06 NOTE — Telephone Encounter (Signed)
Pt called back to update [place] on her sxs.  Pt said she hasn't vomited anymore since leaving the office today. Her HA is still pretty bad, it has improved just a little, overall she feels about the same as when she was in the office earlier today (bad)

## 2013-04-06 NOTE — Telephone Encounter (Signed)
Now that she is able to keep her medicine down- have her try her fiorcet that she has at home - if that is not helpful and headache worsens further -go to ER as she may need IV fluids

## 2013-04-08 NOTE — Assessment & Plan Note (Signed)
Migraine today with some vestibular symptoms and also vomiting  Given toradol and phenergan injections in office and obs for a while - pt stopped vomiting and was able to sleep  inst to begin oral hydration at home and to call with update later today  inst if headache or other symptoms become more severe to go to ER for further eval and IV fluids  Urged to disc migraine prophylaxis with her PCP if this is frequent and disc triggers (ie: sun and heat) - with her daughter to avoid

## 2013-04-11 ENCOUNTER — Telehealth: Payer: Self-pay

## 2013-04-11 NOTE — Telephone Encounter (Signed)
Called pt earlier today and was told to call back in 10 minutes, which I was unable to do.  Called back just now and got pt's answering machine, left message asking patient to call me back.

## 2013-04-11 NOTE — Telephone Encounter (Signed)
I would prefer she increase her lisinopril to 20 mg daily ( take at one time).  Please call us with an update later this week.

## 2013-04-11 NOTE — Telephone Encounter (Signed)
Pt wants to know if takes Lisinopril 10 mg daily and then later in day BP goes up can pt take additional Lisinopril. One day last week pt had taken lisinopril as instructed and later in day BP was 145/101 P 90 pt had h/a and dizziness. Pt wants to know when BP goes up can she take additional BP med. Today pt BP was 119/81, no h/a. [place]. Pt request cb.

## 2013-04-13 NOTE — Telephone Encounter (Signed)
Called patient again, was told by a female family member that pt isnt home, will be home around 5 to 5:30.

## 2013-04-13 NOTE — Telephone Encounter (Signed)
Tried to call at 5:40 pm.  Same female family member says she is not home.

## 2013-04-20 NOTE — Telephone Encounter (Signed)
Called patient's home number and was given pt's cell number, [phone].  I called that number but pt stated she was driving.  I asked patient to call me back on Monday.

## 2013-04-24 NOTE — Telephone Encounter (Signed)
Spoke with patient and advised her as instructed.  She says she wont double up on medicine every day, only when she feels that her BP is up.  I advised her to keep a check of her blood pressure readings and to let us know.  Also, she says she thinks her arches have fallen in her feet and she asked for the name of a foot doctor.  I suggested Dr Theo Dills at [facility].

## 2013-04-26 DIAGNOSIS — M2040 Other hammer toe(s) (acquired), unspecified foot: Secondary | ICD-10-CM | POA: Diagnosis not present

## 2013-04-26 DIAGNOSIS — M2010 Hallux valgus (acquired), unspecified foot: Secondary | ICD-10-CM | POA: Diagnosis not present

## 2013-04-26 DIAGNOSIS — M722 Plantar fascial fibromatosis: Secondary | ICD-10-CM | POA: Diagnosis not present

## 2013-07-26 ENCOUNTER — Other Ambulatory Visit: Payer: Self-pay

## 2013-07-26 MED ORDER — BUTALBITAL-APAP-CAFFEINE 50-325-40 MG PO TABS: 1.0000 | ORAL_TABLET | Freq: Two times a day (BID) | ORAL | Status: DC | PRN

## 2013-07-26 NOTE — Telephone Encounter (Signed)
RX phoned in to pharmacy.  No phone number on file for pt.  Left vm with daughter (emergency contact) to return call.

## 2013-07-26 NOTE — Telephone Encounter (Signed)
Pt request refill butalbital-apap-caffeine to [facility]; pt said she is out of med and has h/a. Dr Dayton Martes has never filled per pt. Dr Franco Nones from [facility] previously, pt said she does not remember who that doctor is.Please advise. Pt request cb when med refilled.

## 2013-07-26 NOTE — Telephone Encounter (Signed)
Please call in and then schedule her to discuss her HA regimen.  Thanks.

## 2013-07-30 NOTE — Telephone Encounter (Signed)
Patient advised. Appointment scheduled.  

## 2013-08-06 ENCOUNTER — Encounter: Payer: Self-pay | Admitting: Family Medicine

## 2013-08-06 ENCOUNTER — Ambulatory Visit (INDEPENDENT_AMBULATORY_CARE_PROVIDER_SITE_OTHER): Payer: Medicare Other | Admitting: Family Medicine

## 2013-08-06 VITALS — BP 104/92 | HR 67 | Temp 98.1°F | Wt 131.5 lb

## 2013-08-06 DIAGNOSIS — G43919 Migraine, unspecified, intractable, without status migrainosus: Secondary | ICD-10-CM | POA: Diagnosis not present

## 2013-08-06 MED ORDER — LISINOPRIL 10 MG PO TABS: 10.0000 mg | ORAL_TABLET | Freq: Every day | ORAL | Status: DC

## 2013-08-06 MED ORDER — FUROSEMIDE 40 MG PO TABS: 40.0000 mg | ORAL_TABLET | Freq: Every day | ORAL | Status: DC

## 2013-08-06 MED ORDER — POTASSIUM CHLORIDE CRYS ER 10 MEQ PO TBCR: 10.0000 meq | EXTENDED_RELEASE_TABLET | Freq: Two times a day (BID) | ORAL | Status: DC

## 2013-08-06 MED ORDER — METOPROLOL SUCCINATE ER 25 MG PO TB24: 12.5000 mg | ORAL_TABLET | Freq: Every day | ORAL | Status: DC

## 2013-08-06 MED ORDER — BUTALBITAL-APAP-CAFFEINE 50-325-40 MG PO TABS: 1.0000 | ORAL_TABLET | Freq: Two times a day (BID) | ORAL | Status: DC | PRN

## 2013-08-06 NOTE — Progress Notes (Signed)
Pre-visit discussion using our clinic review tool. No additional management support is needed unless otherwise documented below in the visit note.  Poor historian coming in for HA f/u.  "They hurt."  It took great effort to get details.  As best as I can determine, the following is accurate:  Frontal pain.  No vision changes. Throbbing pain.  Light and sound bothers her sometimes with a HA.  Taking fiorcet helps. Taking it daily.  Has been taking it daily for about 1 year.  She attributes the HA the tension.  Living with husband and 4 kids at home and "this isn't easy" for her.  She is safe at home.  She didn't recall being on any other meds for HA.  Not taking BCs, Goodys, etc.  Some coffee, rare.  Rare soda.  Her BP tends to go up with headaches.  She has known cervical arthritis per chart.    Meds, vitals, and allergies reviewed.   ROS: See HPI.  Otherwise, noncontributory.  GEN: nad, alert and oriented HEENT: mucous membranes moist NECK: supple w/o LA CV: rrr.   PULM: ctab, no inc wob ABD: soft, +bs EXT: no edema SKIN: no acute rash CN 2-12 wnl B, S/S wnl x4

## 2013-08-06 NOTE — Patient Instructions (Signed)
Start back on the metoprolol 1/2 tab a day.  See if that helps the headaches.   Try to limit the other headache medicine as much as possible.

## 2013-08-07 ENCOUNTER — Telehealth: Payer: Self-pay | Admitting: Family Medicine

## 2013-08-07 DIAGNOSIS — M542 Cervicalgia: Secondary | ICD-10-CM

## 2013-08-07 NOTE — Telephone Encounter (Signed)
Referral ordered

## 2013-08-07 NOTE — Assessment & Plan Note (Signed)
Has this dx, along with likely rebound HA.  Also with possible HA source from OA/DDD in neck.  She hasn't been talking BB recently.  Advised to restart.   Would to taper off fioricet as she continues to have daily HA.  I explained that her current treatment regimen was by definition not working well.   As visit was ending, she asked about fu for neck/back/hand arthritis, told me "I needed refills on all my meds" and handed me ~2 inches of unsorted old records for review.  I will review them and then make them available to the PCP.   Other meds refilled in meantime and she'll have to f/u either here or with an outside clinic about her OA and other concerns.   >25 min spent with face to face with patient, >50% counseling and/or coordinating care.

## 2013-08-07 NOTE — Telephone Encounter (Signed)
Call pt.  Neck MRI report from 2011 reviewed.  If she continues to have neck pain, then we should refer her back to neurosurgery clinic.  I can put in the referral if needed since Dr. Dayton Martes is out.  Old records reviewed; will hold for Dr. Dayton Martes upon return.

## 2013-08-07 NOTE — Telephone Encounter (Signed)
Patient does prefer to proceed with neurosurgery referral.  She does not remember who she has seen in the past so if those records indicate which MD she saw, please let Shirlee Limerick know that in the referral request.

## 2013-08-24 ENCOUNTER — Telehealth: Payer: Self-pay | Admitting: Internal Medicine

## 2013-08-24 NOTE — Telephone Encounter (Signed)
Pt dropped off Handicap placard form to be completed by Dr. Dayton Martes / Sampson Si.  Best number to call when complete is [phone] / lt

## 2013-08-26 NOTE — Telephone Encounter (Signed)
Will fill it out when I am back in the office on Monday

## 2013-09-03 ENCOUNTER — Other Ambulatory Visit: Payer: Self-pay | Admitting: Family Medicine

## 2013-09-03 NOTE — Telephone Encounter (Signed)
Last office visit 08/06/2013 with Dr. Para March.  Ok to refill?

## 2013-09-04 ENCOUNTER — Telehealth: Payer: Self-pay | Admitting: Family Medicine

## 2013-09-04 NOTE — Telephone Encounter (Signed)
Patient Information:  Caller Name: Bunny  Phone: [phone]  Patient: Madison Miller, Madison Miller  Gender: Female  DOB: 09/11/1951  Age: 62 Years  PCP: [name])  Office Follow Up:  Does the office need to follow up with this patient?: No  Instructions For The Office: N/A   Symptoms  Reason For Call & Symptoms: Pt calls for a "refill" of Amoxicillin prescribed in May for cold/sinus sx. RN advised pt these don't get refilled. That if abx if required pt would need to make an appt to be evaluated. RN triaged per sx: sore throat/cough/cold sx onset this week. Advised UC  d/t sx.  It hurts to put her chin to her chest. Pt states "I can't come out in that weather".  Reviewed Health History In EMR: Yes  Reviewed Medications In EMR: Yes  Reviewed Allergies In EMR: Yes  Reviewed Surgeries / Procedures: Yes  Date of Onset of Symptoms: 09/03/2013  Guideline(s) Used:  Cough  Sore Throat  Disposition Per Guideline:   See Today in Office  Reason For Disposition Reached:   Severe sore throat pain  Advice Given:  N/A  Patient Refused Recommendation:  Patient Refused Care Advice  Pt states she couldn't go out in the weather it was too bad.

## 2013-09-07 ENCOUNTER — Encounter: Payer: Self-pay | Admitting: Family Medicine

## 2013-09-07 ENCOUNTER — Ambulatory Visit (INDEPENDENT_AMBULATORY_CARE_PROVIDER_SITE_OTHER): Payer: Medicare Other | Admitting: Family Medicine

## 2013-09-07 VITALS — BP 110/80 | HR 73 | Temp 98.1°F | Wt 137.5 lb

## 2013-09-07 DIAGNOSIS — J069 Acute upper respiratory infection, unspecified: Secondary | ICD-10-CM | POA: Diagnosis not present

## 2013-09-07 MED ORDER — BENZONATATE 200 MG PO CAPS: 200.0000 mg | ORAL_CAPSULE | Freq: Three times a day (TID) | ORAL | Status: DC | PRN

## 2013-09-07 MED ORDER — AZITHROMYCIN 250 MG PO TABS: ORAL_TABLET | ORAL | Status: DC

## 2013-09-07 NOTE — Progress Notes (Signed)
Pre-visit discussion using our clinic review tool. No additional management support is needed unless otherwise documented below in the visit note.  Started with sx about 1 week ago.  Voice is hoarse. Still can swallow but with pain.  No ear pain. No rhinorrhea.  Stuffy. Cough. Some sputum, green. No wheeze. No vomiting, no diarrhea. Some facial pain. No fevers.  Not gargling with salt water yet.  Tried OTC cough drops. ST and cough bother her the most.  No rash.  Fatigued.    Meds, vitals, and allergies reviewed.   ROS: See HPI.  Otherwise, noncontributory.  GEN: nad, alert and oriented HEENT: mucous membranes moist, tm w/o erythema, nasal exam w/o erythema, clear discharge noted, stuffy,  OP with cobblestoning, sinuses ttp x4, hoarse NECK: supple w/o LA, no stridor CV: rrr.   PULM: ctab, no inc wob EXT: no edema SKIN: no acute rash

## 2013-09-07 NOTE — Assessment & Plan Note (Signed)
Nontoxic.  Would treat given the duration and sinus pain.  See instructions, d/w pt.  F/u prn.  She agrees.

## 2013-09-07 NOTE — Patient Instructions (Signed)
Tessalon for cough.  Start the antibiotics today.  Rest your voice.  Drink plenty of fluids, take tylenol as needed, and gargle with warm salt water for your throat.  This should gradually improve.  Take care.  Let us know if you have other concerns.

## 2013-09-18 NOTE — Telephone Encounter (Signed)
Received a call from Dr Doreen Beam office saying that Dr Phoebe Perch declined to see this patient for followup as he had nothing to offer this patient.

## 2013-09-18 NOTE — Telephone Encounter (Signed)
Then if she continues to have headaches and neck pain, I'll need her to see Dr. Dayton Martes in my absence.  Will route to PCP as a FYI.

## 2013-09-18 NOTE — Telephone Encounter (Signed)
There seem to be separate messages here.  Please clarify.  The 09/04/13 note was address on the 09/07/13 OV.

## 2013-09-18 NOTE — Telephone Encounter (Signed)
You put a Referral in for Neurosurgery on 08/07/2013, I have been calling their office and getting no response from them about our request for a Followup appt because this patient has been seen by them in past. I took a VM off my phone that they will not see this patient back.

## 2013-10-12 ENCOUNTER — Other Ambulatory Visit: Payer: Self-pay | Admitting: Family Medicine

## 2013-10-12 NOTE — Telephone Encounter (Signed)
Rx has been faxed to requested pharmacy 

## 2013-10-12 NOTE — Telephone Encounter (Signed)
Electronic refill request.  Please advise. 

## 2013-10-15 ENCOUNTER — Encounter: Payer: Self-pay | Admitting: Family Medicine

## 2013-11-20 ENCOUNTER — Ambulatory Visit (INDEPENDENT_AMBULATORY_CARE_PROVIDER_SITE_OTHER): Payer: Commercial Managed Care - HMO | Admitting: Family Medicine

## 2013-11-20 ENCOUNTER — Encounter: Payer: Self-pay | Admitting: Family Medicine

## 2013-11-20 VITALS — BP 92/58 | HR 90 | Temp 97.9°F | Wt 122.5 lb

## 2013-11-20 DIAGNOSIS — B9789 Other viral agents as the cause of diseases classified elsewhere: Secondary | ICD-10-CM | POA: Diagnosis not present

## 2013-11-20 DIAGNOSIS — B349 Viral infection, unspecified: Secondary | ICD-10-CM | POA: Insufficient documentation

## 2013-11-20 NOTE — Progress Notes (Signed)
Pre visit review using our clinic review tool, if applicable. No additional management support is needed unless otherwise documented below in the visit note.  Started a few days ago.  Her SBP was up to ~170 on outside checks recently.  This has been going on for a few days, BP improved today.  She had HA and abd pain.  The HA is some better now, it was a frontal pain.  Abd pain is also better.  She has neck pain at baseline.  No fevers.  No diarrhea. She was "spitting up" a few times.  She is not eating or drinking a lot recently.  No one else is sick at home.  She had some salt loading prev and that could have affected her BP.  No BLE edema noted.  No rash.    Meds, vitals, and allergies reviewed.   ROS: See HPI.  Otherwise, noncontributory.  nad ncat Tm wnl Nasal exam slightly stuffy, Max sinus ttp B OP wnl Neck supple, no LA rrr ctab abd soft, minimally ttp in the epigastrum initially but not on recheck, normal BS, no rebound.  Ext e/o edema.

## 2013-11-20 NOTE — Patient Instructions (Signed)
Glad you are feeling better.   I would get some rest and drink plenty of fluids in the meantime.  Follow up with Dr. Dayton MartesAron about your back and neck pain.  Take care.

## 2013-11-20 NOTE — Assessment & Plan Note (Signed)
Nontoxic, improved by her own admission.  She does have max tenderness B but wouldn't treat given the likely viral cause and her improvement.  GI upset may have come from post nasal gtt.  F/u prn, f/u with PCP re: chronic neck pain.

## 2014-02-22 ENCOUNTER — Other Ambulatory Visit: Payer: Self-pay | Admitting: Family Medicine

## 2014-03-12 ENCOUNTER — Other Ambulatory Visit: Payer: Self-pay

## 2014-03-12 MED ORDER — LISINOPRIL 10 MG PO TABS: 10.0000 mg | ORAL_TABLET | Freq: Every day | ORAL | Status: DC

## 2014-03-12 MED ORDER — FUROSEMIDE 40 MG PO TABS: ORAL_TABLET | ORAL | Status: DC

## 2014-03-12 MED ORDER — POTASSIUM CHLORIDE CRYS ER 10 MEQ PO TBCR: 10.0000 meq | EXTENDED_RELEASE_TABLET | Freq: Two times a day (BID) | ORAL | Status: DC

## 2014-03-12 NOTE — Telephone Encounter (Signed)
Can refill one time only. Needs to be seen for further refills.

## 2014-03-12 NOTE — Telephone Encounter (Signed)
Pt [facility]; pt last saw Dr Dayton MartesAron to reestablish on 01/30/13 but was seen for sick visit on 11/20/13. Is OK to refill? No future appt scheduled.

## 2014-03-18 ENCOUNTER — Encounter: Payer: Self-pay | Admitting: Family Medicine

## 2014-03-18 ENCOUNTER — Ambulatory Visit (INDEPENDENT_AMBULATORY_CARE_PROVIDER_SITE_OTHER)
Admission: RE | Admit: 2014-03-18 | Discharge: 2014-03-18 | Disposition: A | Discharge status: Home / Self Care | Payer: Medicare Other | Source: Ambulatory Visit | Attending: Family Medicine | Admitting: Family Medicine

## 2014-03-18 ENCOUNTER — Ambulatory Visit (INDEPENDENT_AMBULATORY_CARE_PROVIDER_SITE_OTHER): Payer: Medicare Other | Admitting: Family Medicine

## 2014-03-18 VITALS — BP 124/77 | HR 62 | Temp 97.9°F | Ht 64.0 in | Wt 134.5 lb

## 2014-03-18 DIAGNOSIS — M549 Dorsalgia, unspecified: Secondary | ICD-10-CM

## 2014-03-18 DIAGNOSIS — N200 Calculus of kidney: Secondary | ICD-10-CM | POA: Diagnosis not present

## 2014-03-18 DIAGNOSIS — R109 Unspecified abdominal pain: Secondary | ICD-10-CM | POA: Diagnosis not present

## 2014-03-18 DIAGNOSIS — M542 Cervicalgia: Secondary | ICD-10-CM

## 2014-03-18 DIAGNOSIS — R110 Nausea: Secondary | ICD-10-CM

## 2014-03-18 DIAGNOSIS — R040 Epistaxis: Secondary | ICD-10-CM

## 2014-03-18 DIAGNOSIS — K8020 Calculus of gallbladder without cholecystitis without obstruction: Secondary | ICD-10-CM | POA: Diagnosis not present

## 2014-03-18 DIAGNOSIS — IMO0002 Reserved for concepts with insufficient information to code with codable children: Secondary | ICD-10-CM | POA: Diagnosis not present

## 2014-03-18 DIAGNOSIS — K920 Hematemesis: Secondary | ICD-10-CM

## 2014-03-18 LAB — POCT URINALYSIS DIPSTICK
Bilirubin, UA: NEGATIVE
Blood, UA: NEGATIVE
Glucose, UA: NEGATIVE
Ketones, UA: NEGATIVE
LEUKOCYTES UA: NEGATIVE
NITRITE UA: NEGATIVE
PROTEIN UA: NEGATIVE
Spec Grav, UA: <=1.005
UROBILINOGEN UA: 0.2
pH, UA: 7.5

## 2014-03-18 LAB — CBC WITH DIFFERENTIAL/PLATELET
Basophils Absolute: 0 10*3/uL (ref 0.0–0.1)
Basophils Relative: 0.5 % (ref 0.0–3.0)
EOS PCT: 1.1 % (ref 0.0–5.0)
Eosinophils Absolute: 0.1 10*3/uL (ref 0.0–0.7)
HCT: 39 % (ref 36.0–46.0)
Hemoglobin: 12.7 g/dL (ref 12.0–15.0)
LYMPHS PCT: 29.5 % (ref 12.0–46.0)
Lymphs Abs: 1.7 10*3/uL (ref 0.7–4.0)
MCHC: 32.7 g/dL (ref 30.0–36.0)
MCV: 92.2 fl (ref 78.0–100.0)
MONO ABS: 0.4 10*3/uL (ref 0.1–1.0)
Monocytes Relative: 7 % (ref 3.0–12.0)
NEUTROS PCT: 61.9 % (ref 43.0–77.0)
Neutro Abs: 3.5 10*3/uL (ref 1.4–7.7)
PLATELETS: 219 10*3/uL (ref 150.0–400.0)
RBC: 4.23 Mil/uL (ref 3.87–5.11)
RDW: 14.2 % (ref 11.5–15.5)
WBC: 5.7 10*3/uL (ref 4.0–10.5)

## 2014-03-18 LAB — BASIC METABOLIC PANEL
BUN: 18 mg/dL (ref 6–23)
CO2: 28 mEq/L (ref 19–32)
Calcium: 9.4 mg/dL (ref 8.4–10.5)
Chloride: 105 mEq/L (ref 96–112)
Creatinine, Ser: 1 mg/dL (ref 0.4–1.2)
GFR: 71.31 mL/min (ref >60.00)
Glucose, Bld: 92 mg/dL (ref 70–99)
POTASSIUM: 4.2 meq/L (ref 3.5–5.1)
SODIUM: 140 meq/L (ref 135–145)

## 2014-03-18 LAB — HEPATIC FUNCTION PANEL
ALK PHOS: 43 U/L (ref 39–117)
ALT: 13 U/L (ref 0–35)
AST: 20 U/L (ref 0–37)
Albumin: 4.3 g/dL (ref 3.5–5.2)
Bilirubin, Direct: 0.1 mg/dL (ref 0.0–0.3)
Total Bilirubin: 0.6 mg/dL (ref 0.2–1.2)
Total Protein: 8 g/dL (ref 6.0–8.3)

## 2014-03-18 LAB — LIPASE: Lipase: 39 U/L (ref 11.0–59.0)

## 2014-03-18 MED ORDER — IOHEXOL 300 MG/ML  SOLN
100.0000 mL | Freq: Once | INTRAMUSCULAR | Status: AC | PRN
  Administered 2014-03-18: 100 mL via INTRAVENOUS

## 2014-03-18 NOTE — Progress Notes (Addendum)
[address] [place] [address] Phone: [phone] Fax: [phone]  Patient ID: Madison Miller MRN: [id number], DOB: 1951-08-21, 63 y.o. Date of Encounter: 03/18/2014  Primary Physician:  Ruthe Mannanalia Aron, MD   Chief Complaint: Epistaxis, Back Pain and Abdominal Pain   Subjective:   History of Present Illness:  Madison Miller is a 63 y.o. very pleasant female patient who presents with the following:  As I initially walked in to evaluate the patient, the patient  takes shoes off in the office, and she laid down on the examination table.  She has multiple complaints today. Nose bleeds. Back hurting. Stomach and back for few months. Spitting up blood. Stomach hurting. After spending some time trying to focus the patient, it is her stomach pain it is the primary problem. She also complains of back pain and neck pain, and she is status post lumbar laminectomy in 2010, but this is been ongoing for multiple years.  Has been ongoing for months. She has been having intermittently worsening abdominal pain over the last few months, but he is gotten worse in the last week. She also has had recently some vomiting of blood, but none today. She also has had some coughing of blood per her report. She does not smoke. She has not really tried anything to make her stomach feel better.  Will drink some soda, sprite, ginger ale.  Back, mostly heating pad.   Lumbar laminectomy in 2010 (Dr. Phoebe PerchHirsch)  Taking some motrin and tylenol. Maybe about 1 a day.  She also takes some aspirin.   Past Medical History, Surgical History, Social History, Family History, Problem List, Medications, and Allergies have been reviewed and updated if relevant.  Review of Systems:  GEN: No acute illnesses, no fevers, chills. GI: as above Pulm: No SOB Interactive and getting along well at home. Otherwise, the pertinent positives and negatives are listed above and in the HPI, otherwise a full review of systems  has been reviewed and is negative unless noted positive.   Objective:   Physical Examination: BP 124/77  Pulse 62  Temp(Src) 97.9 F (36.6 C) (Oral)  Ht 5\' 4"  (1.626 m)  Wt 134 lb 8 oz (61.009 kg)  BMI 23.08 kg/m2   GEN: WDWN, NAD, Non-toxic, Alert & Oriented x 3 HEENT: Atraumatic, Normocephalic.  Ears and Nose: No external deformity. CV: rrr. No m/g/r Pulm: CTA B ABD: S, the patient's abdomen is relatively diffusely tender throughout. She does guard. There is no rebound tenderness. It is nonfocal. No distention. No obvious hepatosplenomegaly.  EXTR: No clubbing/cyanosis/edema NEURO: Normal gait.  PSYCH: Flat affect and relatively distant demeanor.   Laboratory and Imaging Data: Ct Abdomen Pelvis W Contrast  03/18/2014   CLINICAL DATA:  Intermittent mid abdominal pain for 1 week. Evaluate for diverticulitis or perforation.  EXAM: CT ABDOMEN AND PELVIS WITH CONTRAST  TECHNIQUE: Multidetector CT imaging of the abdomen and pelvis was performed using the standard protocol following bolus administration of intravenous contrast.  CONTRAST:  100mL OMNIPAQUE IOHEXOL 300 MG/ML  SOLN  COMPARISON:  CT of the lumbar spine 11/22/2007  FINDINGS: Lower chest: Heart size is normal.  Lung bases are clear.  Upper abdomen: No focal abnormality identified within the liver, spleen, pancreas, or adrenal glands. Bilateral intrarenal calculi are identified measuring 8 mm on the left and 8 mm on the right. The ureters have a normal appearance. Probable tiny calcified gallstones are present.  Bowel: The stomach and small bowel loops are normal in  appearance. The appendix is well seen and has a normal appearance. Colonic loops are normal in appearance.  Pelvis: The uterus is present. No adnexal mass for free pelvic fluid identified. No retroperitoneal or mesenteric adenopathy. No evidence for aortic aneurysm.  Abdominal wall: Unremarkable  Osseous structures: Previous lumbosacral fusion. No suspicious lytic or  blastic lesions.  IMPRESSION: 1. Bilateral intrarenal calculi. No evidence for urinary tract obstruction. 2. Small calcified gallstones. 3. Normal appendix. 4. No evidence for bowel obstruction or pelvic abscess. 5. No pelvic mass. 6. Postoperative changes in the lumbosacral spine.   Electronically Signed   By: Rosalie Gums M.D.   On: 03/18/2014 16:26    Results for orders placed in visit on 03/18/14  POCT URINALYSIS DIPSTICK      Result Value Ref Range   Color, UA yellow     Clarity, UA clear     Glucose, UA negative     Bilirubin, UA negative     Ketones, UA negative     Spec Grav, UA <=1.005     Blood, UA negative     pH, UA 7.5     Protein, UA negative     Urobilinogen, UA 0.2     Nitrite, UA negative     Leukocytes, UA Negative       Assessment & Plan:   Abdominal pain, unspecified site - Plan: CBC with Differential, Basic metabolic panel, Hepatic function panel, H. pylori antibody, IgG, Lipase, CT Abdomen Pelvis W Contrast  Back pain, unspecified location - Plan: POCT Urinalysis Dipstick  BACK PAIN, LUMBAR, WITH RADICULOPATHY  Neck pain  Epistaxis  Hematemesis with nausea  Level of medical decision making is high  Abdominal exam is concerning for potential occult abdominal process. Obtain a CT of the abdomen and pelvis with contrast to evaluate for diverticulitis, perf bowel, or other acute abdominal process. At this point, this is returned, and is grossly unremarkable.  Multiple laboratories are pending. At this point, ulcer would seem most likely diagnosis given constellation of symptoms. Given history of active bleeding, early GI input would make no sense that this is the case and the patient is hemodynamically stable. PPI also.   The patient's musculoskeletal complaints are chronic in nature, and they can wait.  Addendum:  03/19/2014  Labs have returned. CBC stable. All essentially normal. Likely ulcer with some intermittent bleeding. Start Omeprazole and I would  like her to see GI.  Results for orders placed in visit on 03/18/14  CBC WITH DIFFERENTIAL      Result Value Ref Range   WBC 5.7  4.0 - 10.5 K/uL   RBC 4.23  3.87 - 5.11 Mil/uL   Hemoglobin 12.7  12.0 - 15.0 g/dL   HCT 16.1  09.6 - 04.5 %   MCV 92.2  78.0 - 100.0 fl   MCHC 32.7  30.0 - 36.0 g/dL   RDW 40.9  81.1 - 91.4 %   Platelets 219.0  150.0 - 400.0 K/uL   Neutrophils Relative % 61.9  43.0 - 77.0 %   Lymphocytes Relative 29.5  12.0 - 46.0 %   Monocytes Relative 7.0  3.0 - 12.0 %   Eosinophils Relative 1.1  0.0 - 5.0 %   Basophils Relative 0.5  0.0 - 3.0 %   Neutro Abs 3.5  1.4 - 7.7 K/uL   Lymphs Abs 1.7  0.7 - 4.0 K/uL   Monocytes Absolute 0.4  0.1 - 1.0 K/uL   Eosinophils Absolute 0.1  0.0 -  0.7 K/uL   Basophils Absolute 0.0  0.0 - 0.1 K/uL  BASIC METABOLIC PANEL      Result Value Ref Range   Sodium 140  135 - 145 mEq/L   Potassium 4.2  3.5 - 5.1 mEq/L   Chloride 105  96 - 112 mEq/L   CO2 28  19 - 32 mEq/L   Glucose, Bld 92  70 - 99 mg/dL   BUN 18  6 - 23 mg/dL   Creatinine, Ser 1.0  0.4 - 1.2 mg/dL   Calcium 9.4  8.4 - 16.1 mg/dL   GFR 09.60  >45.40 mL/min  HEPATIC FUNCTION PANEL      Result Value Ref Range   Total Bilirubin 0.6  0.2 - 1.2 mg/dL   Bilirubin, Direct 0.1  0.0 - 0.3 mg/dL   Alkaline Phosphatase 43  39 - 117 U/L   AST 20  0 - 37 U/L   ALT 13  0 - 35 U/L   Total Protein 8.0  6.0 - 8.3 g/dL   Albumin 4.3  3.5 - 5.2 g/dL  H. PYLORI ANTIBODY, IGG      Result Value Ref Range   H Pylori IgG Negative  Negative  LIPASE      Result Value Ref Range   Lipase 39.0  11.0 - 59.0 U/L  POCT URINALYSIS DIPSTICK      Result Value Ref Range   Color, UA yellow     Clarity, UA clear     Glucose, UA negative     Bilirubin, UA negative     Ketones, UA negative     Spec Grav, UA <=1.005     Blood, UA negative     pH, UA 7.5     Protein, UA negative     Urobilinogen, UA 0.2     Nitrite, UA negative     Leukocytes, UA Negative       New Prescriptions   No  medications on file   Modified Medications   No medications on file   Orders Placed This Encounter  Procedures  . CT Abdomen Pelvis W Contrast  . CBC with Differential  . Basic metabolic panel  . Hepatic function panel  . H. pylori antibody, IgG  . Lipase  . POCT Urinalysis Dipstick   Follow-up: No Follow-up on file. Unless noted above, the patient is to follow-up if symptoms worsen. Red flags were reviewed with the patient.  Signed,  Elpidio Galea. Racer Quam, MD, CAQ Sports Medicine   Discontinued Medications   No medications on file   Current Medications at Discharge:   Medication List       This list is accurate as of: 03/18/14  5:28 PM.  Always use your most recent med list.               aspirin 81 MG tablet  Take 81 mg by mouth daily.     butalbital-acetaminophen-caffeine 50-325-40 MG per tablet  Commonly known as:  FIORICET, ESGIC  TAKE ONE TABLET BY MOUTH TWICE DAILY AS NEEDED FOR HEADACHE     cholecalciferol 1000 UNITS tablet  Commonly known as:  VITAMIN D  Take 1,000 Units by mouth daily.     furosemide 40 MG tablet  Commonly known as:  LASIX  TAKE 1 TABLET BY MOUTH DAILY     lisinopril 10 MG tablet  Commonly known as:  PRINIVIL,ZESTRIL  Take 1 tablet (10 mg total) by mouth daily.     metoprolol succinate 25 MG  24 hr tablet  Commonly known as:  TOPROL-XL  Take 0.5 tablets (12.5 mg total) by mouth daily.     potassium chloride 10 MEQ tablet  Commonly known as:  K-DUR,KLOR-CON  Take 1 tablet (10 mEq total) by mouth 2 (two) times daily.     traMADol 50 MG tablet  Commonly known as:  ULTRAM  Take 50 mg by mouth every 6 (six) hours as needed for pain. Take 1-2 tabs up to tid as needed

## 2014-03-18 NOTE — Progress Notes (Signed)
Pre visit review using our clinic review tool, if applicable. No additional management support is needed unless otherwise documented below in the visit note. 

## 2014-03-18 NOTE — Patient Instructions (Addendum)
STOP aspirin  Stop motrin, ibuprofen, advil, alleve or anything like that.     REFERRALS TO SPECIALISTS, SPECIAL TESTS (MRI, CT, ULTRASOUNDS)  GO THE WAITING ROOM AND TELL CHECK IN YOU NEED HELP WITH A REFERRAL. Either MARION or LINDA will help you set it up.  If it is between 1-2 PM they may be at lunch.  After 5 PM, they will likely be at home.  They will call you, so please make sure the office has your correct phone number.  Referrals sometimes can be done same day if urgent, but others can take 2 or 3 days to get an appointment. Starting in 2015, many of the new Medicare insurance plans and Affordable Care Act (Obamacare) Health plans offered take much longer for referrals. They have added additional paperwork and steps.  MRI's and CT's can take up to a week for the test. (Emergencies like strokes take precedence. I will tell you if you have an emergency.)   If [facility], their office may contact you directly prior to our office reaching you.  -- Examples: [facility], [facility], [facility], [facility], [facility], and many more.  Specialist appointment times vary a great deal, mostly on the specialist's schedule and if they have openings. -- Our office tries to get you in as fast as possible. -- Some specialists have very long wait times. (Example. Dermatology. Usually months) -- If you have a true emergency like new cancer, we work to get you in ASAP.

## 2014-03-19 ENCOUNTER — Encounter: Payer: Self-pay | Admitting: *Deleted

## 2014-03-19 LAB — H. PYLORI ANTIBODY, IGG: H Pylori IgG: NEGATIVE

## 2014-03-19 MED ORDER — OMEPRAZOLE 20 MG PO CPDR: DELAYED_RELEASE_CAPSULE | ORAL | Status: DC

## 2014-03-19 NOTE — Addendum Note (Signed)
Addended by: Hannah BeatOPLAND, SPENCER on: 03/19/2014 08:32 AM   Modules accepted: Orders

## 2014-03-19 NOTE — Addendum Note (Signed)
Addended by: Damita LackLORING, DONNA S on: 03/19/2014 01:21 PM   Modules accepted: Orders

## 2014-03-21 ENCOUNTER — Encounter: Payer: Self-pay | Admitting: Internal Medicine

## 2014-03-21 ENCOUNTER — Ambulatory Visit (INDEPENDENT_AMBULATORY_CARE_PROVIDER_SITE_OTHER): Payer: Medicare Other | Admitting: Internal Medicine

## 2014-03-21 VITALS — BP 110/80 | HR 60 | Ht 63.5 in | Wt 133.5 lb

## 2014-03-21 DIAGNOSIS — R932 Abnormal findings on diagnostic imaging of liver and biliary tract: Secondary | ICD-10-CM | POA: Diagnosis not present

## 2014-03-21 DIAGNOSIS — R1012 Left upper quadrant pain: Secondary | ICD-10-CM

## 2014-03-21 NOTE — Patient Instructions (Addendum)
You have been scheduled for an endoscopy. Please follow written instructions given to you at your visit today. If you use inhalers (even only as needed), please bring them with you on the day of your procedure. Your physician has requested that you go to www.startemmi.com and enter the access code given to you at your visit today. This web site gives a general overview about your procedure. However, you should still follow specific instructions given to you by our office regarding your preparation for the procedure.  Continue Prilosec and stop taking Advil. Only take Tylenol for pain relief.   Cc: Dr Milinda Antisower, Dr Patsy Lageropland

## 2014-03-21 NOTE — Progress Notes (Signed)
Madison FullingCarrie E Miller 04/30/1951 [id number]  Note: This dictation was prepared with Dragon digital system. Any transcriptional errors that result from this procedure are unintentional.   History of Present Illness: This is a 63 year old African American female with the left upper quadrant abdominal pain of several months duration, it bothers her during day and sometimes at night and sometimes it goes away entirely. It is not associated with meals. Her appetite has been good and her weight has been stable. She had an episode of flow volume hematemesis. The pain does not radiate to her back. She was found to have a gallstones on the CT scan of the abdomen from 03/18/2014 in addition to bilateral renal calculi. Her liver function tests are normal ,her lipase is normal at 39 and , H. pylori anybody was negative. She gives history of  ulcer many years ago. She was taking Advil for abdominal pain but has discontinued it since. Screening colonoscopy in August 2011 elsewhere was normal she doesn't smoke or drink alcohol. There is no history of pancreatitis    Past Medical History  Diagnosis Date  . Hyperparathyroidism   . SVT (supraventricular tachycardia)   . PAC (premature atrial contraction)   . Degenerative joint disease of cervical and lumbar spine   . Spondylosis   . Degenerative lumbar disc   . Radiculopathy   . Cardiac LV ejection fraction >40%   . GERD (gastroesophageal reflux disease)   . Chest pain   . Right bundle branch block and left anterior fascicular block   . Allergic rhinitis due to pollen   . Migraine, unspecified, with intractable migraine, so stated, without mention of status migrainosus   . Nephrolithiasis   . Gallstones   . CHF (congestive heart failure)   . HTN (hypertension)     Past Surgical History  Procedure Laterality Date  . Parathyroidectomy  2009  . Lumbar laminectomy  2010    Dr. Phoebe PerchHirsch    No Known Allergies  Family history and social history have been  reviewed.  Review of Systems: Negative for dysphagia nausea or weight loss  The remainder of the 10 point ROS is negative except as outlined in the H&P  Physical Exam: General Appearance Well developed, in no distress Eyes  Non icteric  HEENT  Non traumatic, normocephalic  Mouth No lesion, tongue papillated, no cheilosis Neck Supple without adenopathy, thyroid not enlarged, no carotid bruits, no JVD Lungs Clear to auscultation bilaterally COR Normal S1, normal S2, regular rhythm, no murmur, quiet precordium Abdomen soft with tenderness in the left middle quadrant no palpable mass. No CVA tenderness. No pulsations. Right upper quadrant is normal with liver edge at costal margin. Rectal soft Hemoccult negative stool Extremities  No pedal edema Skin No lesions Neurological Alert and oriented x 3 Psychological Normal mood and affect  Assessment and Plan:   63 year old African American female with chronic left upper quadrant abdominal discomfort and pain with negative CT scan of the abdomen except for a calcified gallstones. She is definitely tender in the left upper quadrant. She is up-to-date on the colonoscopy. We will proceed with upper endoscopy to rule out gastric ulcer or gastritis.. Another possibility would be low-grade pancreatitis despite the normal CT scan of the pancreas and normal lipase. Continue Prilosec 20 mg daily which was started yesterday  Asymptomatic gallstones,  normal liver function tests. Patient gives no history suggestive of biliary colic. Certainly could of low-grade biliary pancreatitis. She may need at some point HIDA scan to assess gallbladder function.    Lina SarDora Brodie 03/21/2014

## 2014-04-05 ENCOUNTER — Telehealth: Payer: Self-pay

## 2014-04-05 NOTE — Telephone Encounter (Signed)
Pt missed call from [phone] and pt thought was from our office; I called [phone] spoke with Wynona Caneshristine in GI and the call was to remind pt of endoscopy appt on 04/12/14. Pt said she plans to keep appt and if problem arises will call [phone] x 3.

## 2014-04-12 ENCOUNTER — Ambulatory Visit (AMBULATORY_SURGERY_CENTER): Payer: Medicare Other | Admitting: Internal Medicine

## 2014-04-12 ENCOUNTER — Encounter: Payer: Self-pay | Admitting: Internal Medicine

## 2014-04-12 VITALS — BP 137/89 | HR 57 | Temp 97.0°F | Resp 22 | Ht 63.5 in | Wt 133.0 lb

## 2014-04-12 DIAGNOSIS — I509 Heart failure, unspecified: Secondary | ICD-10-CM | POA: Diagnosis not present

## 2014-04-12 DIAGNOSIS — R109 Unspecified abdominal pain: Secondary | ICD-10-CM | POA: Diagnosis not present

## 2014-04-12 DIAGNOSIS — I10 Essential (primary) hypertension: Secondary | ICD-10-CM | POA: Diagnosis not present

## 2014-04-12 DIAGNOSIS — R1012 Left upper quadrant pain: Secondary | ICD-10-CM

## 2014-04-12 DIAGNOSIS — I4510 Unspecified right bundle-branch block: Secondary | ICD-10-CM | POA: Diagnosis not present

## 2014-04-12 DIAGNOSIS — K2990 Gastroduodenitis, unspecified, without bleeding: Secondary | ICD-10-CM

## 2014-04-12 DIAGNOSIS — K2970 Gastritis, unspecified, without bleeding: Secondary | ICD-10-CM | POA: Diagnosis not present

## 2014-04-12 MED ORDER — SODIUM CHLORIDE 0.9 % IV SOLN: 500.0000 mL | INTRAVENOUS | Status: DC

## 2014-04-12 NOTE — Progress Notes (Signed)
Called to room to assist during endoscopic procedure.  Patient ID and intended procedure confirmed with present staff. Received instructions for my participation in the procedure from the performing physician.  

## 2014-04-12 NOTE — Op Note (Signed)
[facility] [address] [place], [id number]   ENDOSCOPY PROCEDURE REPORT  PATIENT: Madison Miller, Madison E.  MR#: [id number] BIRTHDATE: [birth year] , 62  yrs. old GENDER: Female ENDOSCOPIST: Hart Carwinora M Norine Reddington, MD REFERRED BY:  Ruthe Mannanalia Aron, M.D. PROCEDURE DATE:  04/12/2014 PROCEDURE:  EGD w/ biopsy ASA CLASS:     Class II INDICATIONS:  lleft upper quadrant abdominal pain.  CT scan of the abdomen shows cholelithiasis.  Liver function tests are normal. Patient gives history of ulcer. MEDICATIONS: MAC sedation, administered by CRNA and propofol (Diprivan) 100mg  IV TOPICAL ANESTHETIC: none  DESCRIPTION OF PROCEDURE: After the risks benefits and alternatives of the procedure were thoroughly explained, informed consent was obtained.  The LB [id number] endoscope was introduced through the mouth and advanced to the second portion of the duodenum. Without limitations.  The instrument was slowly withdrawn as the mucosa was fully examined.      Esophagus: proximal mid and distal esophageal mucosa appeared normal. Squamocolumnar junction was normal. There was a small reducible 1 cm hiatal hernia. There was no stricture Stomach: the gastric folds were normal. There was minimal erythema in the gastric antrum. Biopsies were obtained to rule out H. pylori. Pyloric outlet was normal. Retroflexion of the endoscope revealed normal fundus and cardia Duodenum: duodenal bulb and descending duodenum was normal[ The scope was then withdrawn from the patient and the procedure completed.  COMPLICATIONS: There were no complications. ENDOSCOPIC IMPRESSION:  minimal antral gastritis. Status post biopsies to r/o H.Pylori. Nothing to account for left upper quadrant abdominal pain, no evidence of ulcer  RECOMMENDATIONS: Await biopsy results consider a HIDA scan if pain continues but currently she has an asymptomatic cholelithiasis continue PPI prn REPEAT EXAM: for EGD pending  biopsy results.  eSigned:  Hart Carwinora M Elim Peale, MD 04/12/2014 11:27 AM   CC:  PATIENT NAME:  Madison Miller, Madison E. MR#: [id number]

## 2014-04-12 NOTE — Progress Notes (Signed)
Procedure ends, to recovery, report given and VSS. 

## 2014-04-12 NOTE — Patient Instructions (Signed)

## 2014-04-15 ENCOUNTER — Telehealth: Payer: Self-pay | Admitting: *Deleted

## 2014-04-15 NOTE — Telephone Encounter (Signed)
  Follow up Call-  Call back number 04/12/2014  Post procedure Call Back phone  # [phone]  Permission to leave phone message Yes     Patient questions:  Do you have a fever, pain , or abdominal swelling? No. Pain Score  0 *  Have you tolerated food without any problems? Yes.    Have you been able to return to your normal activities? Yes.    Do you have any questions about your discharge instructions: Diet   No. Medications  No. Follow up visit  No.  Do you have questions or concerns about your Care? No.  Actions: * If pain score is 4 or above: No action needed, pain <4.

## 2014-04-16 ENCOUNTER — Encounter: Payer: Self-pay | Admitting: Internal Medicine

## 2014-05-04 ENCOUNTER — Other Ambulatory Visit: Payer: Self-pay | Admitting: Family Medicine

## 2014-06-04 ENCOUNTER — Other Ambulatory Visit: Payer: Self-pay | Admitting: Family Medicine

## 2014-06-05 NOTE — Telephone Encounter (Signed)
Lm on pts vm. Pt has not seen Dr Dayton Martes since 01/2013. Last refill in 04/2014 indicated that an OV is required for additional refills

## 2014-06-05 NOTE — Telephone Encounter (Signed)
Pt left  V/m requesting cb; wants to know why cannot get lisinopril filled.

## 2014-06-06 NOTE — Telephone Encounter (Signed)
Spoke to pt and advised her OV is required for additional refills. Sched appt moved from next week to 06/07/14

## 2014-06-06 NOTE — Telephone Encounter (Signed)
Pt left v/m requesting cb about lisinopril refill. pts mailbox is full.

## 2014-06-07 MED ORDER — LISINOPRIL 10 MG PO TABS: 10.0000 mg | ORAL_TABLET | Freq: Every day | ORAL | Status: DC

## 2014-06-07 NOTE — Addendum Note (Signed)
Addended by: Patience Musca on: 06/07/2014 10:55 AM   Modules accepted: Orders

## 2014-06-07 NOTE — Telephone Encounter (Addendum)
Pt left v/m; pt wants to ck on medication. Pt scheduled appt on 06/11/14 at 11:30 pm to see Dr Dayton Martes and lisinopril # 7 sent to [facility]. Pt voiced understanding.

## 2014-06-11 ENCOUNTER — Encounter: Payer: Self-pay | Admitting: Family Medicine

## 2014-06-11 ENCOUNTER — Ambulatory Visit (INDEPENDENT_AMBULATORY_CARE_PROVIDER_SITE_OTHER): Payer: Medicare Other | Admitting: Family Medicine

## 2014-06-11 VITALS — BP 120/74 | HR 61 | Temp 97.9°F | Wt 133.5 lb

## 2014-06-11 DIAGNOSIS — Z136 Encounter for screening for cardiovascular disorders: Secondary | ICD-10-CM | POA: Diagnosis not present

## 2014-06-11 DIAGNOSIS — I452 Bifascicular block: Secondary | ICD-10-CM

## 2014-06-11 DIAGNOSIS — R002 Palpitations: Secondary | ICD-10-CM | POA: Diagnosis not present

## 2014-06-11 DIAGNOSIS — M4800 Spinal stenosis, site unspecified: Secondary | ICD-10-CM

## 2014-06-11 DIAGNOSIS — I10 Essential (primary) hypertension: Secondary | ICD-10-CM

## 2014-06-11 LAB — LIPID PANEL
CHOLESTEROL: 157 mg/dL (ref 0–200)
HDL: 45.6 mg/dL (ref >39.00)
LDL CALC: 93 mg/dL (ref 0–99)
NonHDL: 111.4
Total CHOL/HDL Ratio: 3
Triglycerides: 93 mg/dL (ref 0.0–149.0)
VLDL: 18.6 mg/dL (ref 0.0–40.0)

## 2014-06-11 LAB — COMPREHENSIVE METABOLIC PANEL
ALK PHOS: 45 U/L (ref 39–117)
ALT: 13 U/L (ref 0–35)
AST: 21 U/L (ref 0–37)
Albumin: 4.3 g/dL (ref 3.5–5.2)
BUN: 17 mg/dL (ref 6–23)
CO2: 29 meq/L (ref 19–32)
CREATININE: 1 mg/dL (ref 0.4–1.2)
Calcium: 9.6 mg/dL (ref 8.4–10.5)
Chloride: 106 mEq/L (ref 96–112)
GFR: 70.45 mL/min (ref >60.00)
Glucose, Bld: 66 mg/dL — ABNORMAL LOW (ref 70–99)
Potassium: 3.8 mEq/L (ref 3.5–5.1)
Sodium: 140 mEq/L (ref 135–145)
Total Bilirubin: 0.5 mg/dL (ref 0.2–1.2)
Total Protein: 8.2 g/dL (ref 6.0–8.3)

## 2014-06-11 MED ORDER — POTASSIUM CHLORIDE CRYS ER 10 MEQ PO TBCR: 10.0000 meq | EXTENDED_RELEASE_TABLET | Freq: Two times a day (BID) | ORAL | Status: DC

## 2014-06-11 MED ORDER — LISINOPRIL 10 MG PO TABS
10.0000 mg | ORAL_TABLET | Freq: Every day | ORAL | Status: DC
Start: 2014-06-11 — End: 2014-09-11

## 2014-06-11 MED ORDER — FUROSEMIDE 40 MG PO TABS: ORAL_TABLET | ORAL | Status: DC

## 2014-06-11 MED ORDER — METOPROLOL SUCCINATE ER 25 MG PO TB24: 12.5000 mg | ORAL_TABLET | Freq: Every day | ORAL | Status: DC

## 2014-06-11 NOTE — Assessment & Plan Note (Signed)
Refilled her rxs today but needs to see Dr. Shirlee Latch for follow up. Strongly advised her to make an appt to see him. The patient indicates understanding of these issues and agrees with the plan.

## 2014-06-11 NOTE — Assessment & Plan Note (Signed)
Stable. Meds refilled- see above. Overdue for labs. Orders Placed This Encounter  Procedures  . Comprehensive metabolic panel  . Lipid panel

## 2014-06-11 NOTE — Progress Notes (Signed)
Subjective:    Patient ID: Madison Miller, female    DOB: 1951/01/27, 63 y.o.   MRN: [id number]  HPI  63 yo pt here for med refills.  I have not seen her since she established care in 01/2013.   History of SVT and atypical chest pain- followed by Marca Ancona.  Last saw him in 2013.  Has been taking Toprol 25 mg and lasix daily.  Has had no recent CP, SOB or LE edema.  Does have remote h/o hypercalcemia.  Lab Results  Component Value Date   CREATININE 1.0 03/18/2014   Lab Results  Component Value Date   CHOL 138 01/30/2013   HDL 44.30 01/30/2013   LDLCALC 79 01/30/2013   TRIG 75.0 01/30/2013   CHOLHDL 3 01/30/2013   H/o spinal stenosis- feels her symptoms are progressing.  Feels constant low back pain, not just with walking or changes in position.  Has seen Dr. Phoebe Perch but has not seen him in sometime.  Patient Active Problem List   Diagnosis Date Noted  . Chest pain 01/23/2012  . Claudication 01/23/2012  . PALPITATIONS 09/03/2010  . RBB W/ LFAB 10/14/2008  . SPINAL STENOSIS 07/17/2008  . ALLERGIC RHINITIS, SEASONAL 07/03/2008  . BACK PAIN 05/08/2008  . FOOT PAIN, BILATERAL 05/08/2008  . SUPRAVENTRICULAR TACHYCARDIA, HX OF 02/23/2008  . DYSPEPSIA 02/13/2008  . HYPERCALCEMIA 01/11/2008  . BACK PAIN, LUMBAR, WITH RADICULOPATHY 05/19/2007  . MIGRAINE NOS W/INTRACTABLE MIGRAINE 12/15/2006   Past Medical History  Diagnosis Date  . Hyperparathyroidism   . SVT (supraventricular tachycardia)   . PAC (premature atrial contraction)   . Degenerative joint disease of cervical and lumbar spine   . Spondylosis   . Degenerative lumbar disc   . Radiculopathy   . Cardiac LV ejection fraction >40%   . GERD (gastroesophageal reflux disease)   . Chest pain   . Right bundle branch block and left anterior fascicular block   . Allergic rhinitis due to pollen   . Migraine, unspecified, with intractable migraine, so stated, without mention of status migrainosus   . Nephrolithiasis    . Gallstones   . CHF (congestive heart failure)   . HTN (hypertension)    Past Surgical History  Procedure Laterality Date  . Parathyroidectomy  2009  . Lumbar laminectomy  2010    Dr. Phoebe Perch   History  Substance Use Topics  . Smoking status: Never Smoker   . Smokeless tobacco: Never Used  . Alcohol Use: No   Family History  Problem Relation Age of Onset  . Other Mother     barin tumor  . Stroke Father   . Colon cancer Neg Hx   . Rectal cancer Neg Hx   . Stomach cancer Neg Hx    Allergies  Allergen Reactions  . Lactose Intolerance (Gi)     Pain,bloating   Current Outpatient Prescriptions on File Prior to Visit  Medication Sig Dispense Refill  . butalbital-acetaminophen-caffeine (FIORICET, ESGIC) 50-325-40 MG per tablet TAKE ONE TABLET BY MOUTH TWICE DAILY AS NEEDED FOR HEADACHE  60 tablet  0  . cholecalciferol (VITAMIN D) 1000 UNITS tablet Take 1,000 Units by mouth daily.      Marland Kitchen omeprazole (PRILOSEC) 20 MG capsule Take one capsule by mouth 30 minutes before breakfast.  30 capsule  5  . traMADol (ULTRAM) 50 MG tablet Take 50 mg by mouth every 6 (six) hours as needed for pain. Take 1-2 tabs up to tid as needed  No current facility-administered medications on file prior to visit.   The PMH, PSH, Social History, Family History, Medications, and allergies have been reviewed in [facility], and have been updated if relevant.     Review of Systems See HPI   No LE edema +back pain with radiculopathy - chronic, followed by Dr. Phoebe Perch-  No HA No blurred vision No nausea, vomiting or diarrhea No bladder or bowel incontinence  Objective:   Physical Exam BP 120/74  Pulse 61  Temp(Src) 97.9 F (36.6 C) (Oral)  Wt 133 lb 8 oz (60.555 kg)  SpO2 97% General: NAD, thin  Neck: No JVD, no thyromegaly or thyroid nodule.  Lungs: Clear to auscultation bilaterally with normal respiratory effort.  CV: Nondisplaced PMI. Heart regular S1/S2, no S3/S4, widely split S2, no murmur.  No peripheral edema. Abdomen: Soft, nontender, no hepatosplenomegaly, no distention.  Neurologic: Alert and oriented x 3.  Ataxic gait Psych: Normal affect.  Extremities: No clubbing or cyanosis.     Assessment & Plan:

## 2014-06-11 NOTE — Patient Instructions (Signed)
Good to see you. I will call you with your lab results.  Please make an appointment to see your heart doctor, Dr. Shirlee Latch.

## 2014-06-11 NOTE — Progress Notes (Signed)
Pre visit review using our clinic review tool, if applicable. No additional management support is needed unless otherwise documented below in the visit note. 

## 2014-06-11 NOTE — Assessment & Plan Note (Signed)
With progressive symptoms Advised follow up with Dr. Blanche East

## 2014-06-12 ENCOUNTER — Encounter: Payer: Self-pay | Admitting: Family Medicine

## 2014-09-11 ENCOUNTER — Other Ambulatory Visit: Payer: Self-pay | Admitting: Family Medicine

## 2014-09-17 ENCOUNTER — Telehealth: Payer: Self-pay

## 2014-09-17 NOTE — Telephone Encounter (Signed)
PLEASE NOTE: All timestamps contained within this report are represented as [place] Standard Time. CONFIDENTIALTY NOTICE: This fax transmission is intended only for the addressee. It contains information that is legally privileged, confidential or otherwise protected from use or disclosure. If you are not the intended recipient, you are strictly prohibited from reviewing, disclosing, copying using or disseminating any of this information or taking any action in reliance on or regarding this information. If you have received this fax in error, please notify us immediately by telephone so that we can arrange for its return to us. Phone: [phone], Toll-Free: [phone], Fax: [phone] Page: 1 of 2 Call Id: [id number] [facility] [place] [facility] - Client TELEPHONE ADVICE RECORD [facility] Patient Name: Madison Miller Gender: Female DOB: Dec 30, 1950 Age: [age] Y 2 D Return Phone Number: [phone] (Primary) Address: City/State/Zip: [place] [facility] [place] Client [place] [facility] Day Physician Ruthe MannanAron, Talia Contact Type Call Call Type Triage / Clinical Relationship To Patient Self Return Phone Number [phone] (Primary) Chief Complaint Dizziness Initial Comment Caller states she has a headache, and she has been dizzy. PreDisposition Call Doctor Nurse Assessment Nurse: Anner CreteWells, RN, Madison Miller Date/Time (Eastern Time): 09/17/2014 1:45:32 PM Confirm and document reason for call. If symptomatic, describe symptoms. ---Caller states she has a headache, and she has been dizzy. Her BP=105/ 60. Has the patient traveled out of the country within the last 30 days? ---Not Applicable Does the patient require triage? ---Yes Related visit to physician within the last 2 weeks? ---No Does the PT have any chronic conditions? (i.e. diabetes, asthma, etc.) ---Yes List chronic conditions.  ---HTN Guidelines Guideline Title Affirmed Question Affirmed Notes Nurse Date/Time (Eastern Time) Dizziness - Lightheadedness [1] MODERATE dizziness (e.g., interferes with normal activities) AND [2] has NOT been evaluated by physician for this (Exception: dizziness caused by heat exposure, sudden standing, or poor fluid intake) Anner CreteWells, RN, Madison Miller 09/17/2014 1:48:20 PM Disp. Time Lamount Cohen(Eastern Time) Disposition Final User 09/17/2014 1:52:03 PM See Physician within 24 Hours Yes Anner CreteWells, RN, Madison Miller Caller Understands: Yes PLEASE NOTE: All timestamps contained within this report are represented as [place] Standard Time. CONFIDENTIALTY NOTICE: This fax transmission is intended only for the addressee. It contains information that is legally privileged, confidential or otherwise protected from use or disclosure. If you are not the intended recipient, you are strictly prohibited from reviewing, disclosing, copying using or disseminating any of this information or taking any action in reliance on or regarding this information. If you have received this fax in error, please notify us immediately by telephone so that we can arrange for its return to us. Phone: [phone], Toll-Free: [phone], Fax: [phone] Page: 2 of 2 Call Id: [id number] Disagree/Comply: Comply Care Advice Given Per Guideline SEE PHYSICIAN WITHIN 24 HOURS: * IF OFFICE WILL BE OPEN: You need to be examined within the next 24 hours. Call your doctor when the office opens, and make an appointment. FLUIDS: Drink several glasses of fruit juice, other clear fluids or water. This will improve hydration and blood glucose. If the weather is hot, make sure the fluids are cold. CALL BACK IF: * Passes out (faints) * You become worse. CARE ADVICE given per Dizziness (Adult) guideline. After Care Instructions Given Call Event Type User Date / Time Description Referrals REFERRED TO PCP OFFICE

## 2014-09-17 NOTE — Telephone Encounter (Signed)
Pt already has appt to see Dr Para Marchuncan on 09/18/14 at 6:15 pm.

## 2014-09-18 ENCOUNTER — Ambulatory Visit (INDEPENDENT_AMBULATORY_CARE_PROVIDER_SITE_OTHER): Payer: Medicare Other | Admitting: Family Medicine

## 2014-09-18 ENCOUNTER — Encounter: Payer: Self-pay | Admitting: Family Medicine

## 2014-09-18 ENCOUNTER — Ambulatory Visit: Payer: Medicare Other | Admitting: Family Medicine

## 2014-09-18 VITALS — BP 124/76 | HR 70 | Temp 98.8°F | Ht 63.5 in | Wt 134.5 lb

## 2014-09-18 DIAGNOSIS — J019 Acute sinusitis, unspecified: Secondary | ICD-10-CM | POA: Insufficient documentation

## 2014-09-18 DIAGNOSIS — J0100 Acute maxillary sinusitis, unspecified: Secondary | ICD-10-CM

## 2014-09-18 MED ORDER — AMOXICILLIN-POT CLAVULANATE 875-125 MG PO TABS: 1.0000 | ORAL_TABLET | Freq: Two times a day (BID) | ORAL | Status: DC

## 2014-09-18 NOTE — Patient Instructions (Signed)
Drink lots of fluids mucinex over the counter may help with congestion and cough  [facility]   Breathing steam helps congestion  Warm compresses on face also help   Update if not starting to improve in a week or if worsening

## 2014-09-18 NOTE — Progress Notes (Signed)
Pre visit review using our clinic review tool, if applicable. No additional management support is needed unless otherwise documented below in the visit note. 

## 2014-09-18 NOTE — Progress Notes (Signed)
Subjective:    Patient ID: Madison Miller, female    DOB: 03/04/1951, 63 y.o.   MRN: [id number]  HPI Here with sinus symptoms   This week - having congestion and sinus pressure  Pain under her eyes-worse on the L  Yellow nasal drainage  Cough - persistent and occ green sputum No fever   Took tylenol for headache   Drinking lots of fluids   Ears do not hurt Throat is a little sore   Patient Active Problem List   Diagnosis Date Noted  . Acute sinusitis 09/18/2014  . HTN (hypertension) 06/11/2014  . Chest pain 01/23/2012  . Claudication 01/23/2012  . PALPITATIONS 09/03/2010  . RBB W/ LFAB 10/14/2008  . SPINAL STENOSIS 07/17/2008  . ALLERGIC RHINITIS, SEASONAL 07/03/2008  . BACK PAIN 05/08/2008  . FOOT PAIN, BILATERAL 05/08/2008  . SUPRAVENTRICULAR TACHYCARDIA, HX OF 02/23/2008  . DYSPEPSIA 02/13/2008  . HYPERCALCEMIA 01/11/2008  . BACK PAIN, LUMBAR, WITH RADICULOPATHY 05/19/2007  . MIGRAINE NOS W/INTRACTABLE MIGRAINE 12/15/2006   Past Medical History  Diagnosis Date  . Hyperparathyroidism   . SVT (supraventricular tachycardia)   . PAC (premature atrial contraction)   . Degenerative joint disease of cervical and lumbar spine   . Spondylosis   . Degenerative lumbar disc   . Radiculopathy   . Cardiac LV ejection fraction >40%   . GERD (gastroesophageal reflux disease)   . Chest pain   . Right bundle branch block and left anterior fascicular block   . Allergic rhinitis due to pollen   . Migraine, unspecified, with intractable migraine, so stated, without mention of status migrainosus   . Nephrolithiasis   . Gallstones   . CHF (congestive heart failure)   . HTN (hypertension)    Past Surgical History  Procedure Laterality Date  . Parathyroidectomy  2009  . Lumbar laminectomy  2010    Dr. Phoebe PerchHirsch   History  Substance Use Topics  . Smoking status: Never Smoker   . Smokeless tobacco: Never Used  . Alcohol Use: No   Family History  Problem Relation  Age of Onset  . Other Mother     barin tumor  . Stroke Father   . Colon cancer Neg Hx   . Rectal cancer Neg Hx   . Stomach cancer Neg Hx    Allergies  Allergen Reactions  . Lactose Intolerance (Gi)     Pain,bloating   Current Outpatient Prescriptions on File Prior to Visit  Medication Sig Dispense Refill  . butalbital-acetaminophen-caffeine (FIORICET, ESGIC) 50-325-40 MG per tablet TAKE ONE TABLET BY MOUTH TWICE DAILY AS NEEDED FOR HEADACHE 60 tablet 0  . cholecalciferol (VITAMIN D) 1000 UNITS tablet Take 1,000 Units by mouth daily.    . furosemide (LASIX) 40 MG tablet TAKE 1 TABLET BY MOUTH DAILY 30 tablet 2  . lisinopril (PRINIVIL,ZESTRIL) 10 MG tablet TAKE 1 TABLET BY MOUTH DAILY 30 tablet 2  . metoprolol succinate (TOPROL-XL) 25 MG 24 hr tablet Take 0.5 tablets (12.5 mg total) by mouth daily. 30 tablet 2  . potassium chloride (K-DUR,KLOR-CON) 10 MEQ tablet Take 1 tablet (10 mEq total) by mouth 2 (two) times daily. 60 tablet 2  . traMADol (ULTRAM) 50 MG tablet Take 50 mg by mouth every 6 (six) hours as needed for pain. Take 1-2 tabs up to tid as needed     No current facility-administered medications on file prior to visit.     Review of Systems Review of Systems  Constitutional: Negative for fever,  appetite change, and unexpected weight change. pos for fatigue and malaise ENT pos for congestion and facial pain and purulent nasal drainage  Eyes: Negative for pain and visual disturbance.  Respiratory: Negative for wheeze and shortness of breath.   Cardiovascular: Negative for cp or palpitations    Gastrointestinal: Negative for nausea, diarrhea and constipation.  Genitourinary: Negative for urgency and frequency.  Skin: Negative for pallor or rash   Neurological: Negative for weakness, light-headedness, numbness and headaches.  Hematological: Negative for adenopathy. Does not bruise/bleed easily.  Psychiatric/Behavioral: Negative for dysphoric mood. The patient is not  nervous/anxious.         Objective:   Physical Exam  Constitutional: She appears well-developed and well-nourished. No distress.  HENT:  Head: Normocephalic and atraumatic.  Right Ear: External ear normal.  Left Ear: External ear normal.  Mouth/Throat: Oropharynx is clear and moist.  Nares are injected and congested  Maxillary sinus tenderness worse on the L  Throat clear   Eyes: Conjunctivae and EOM are normal. Pupils are equal, round, and reactive to light. Right eye exhibits no discharge. Left eye exhibits no discharge.  Neck: Normal range of motion. Neck supple.  Cardiovascular: Normal rate and regular rhythm.   Pulmonary/Chest: Effort normal and breath sounds normal. No respiratory distress. She has no wheezes. She has no rales.  Lymphadenopathy:    She has no cervical adenopathy.  Neurological: She is alert.  Skin: Skin is warm and dry. No rash noted. No erythema. No pallor.  Psychiatric: She has a normal mood and affect.          Assessment & Plan:   Problem List Items Addressed This Visit      Respiratory   Acute sinusitis - Primary    With L >R maxillary sinus pain, purulent nasal drainage and cough Disc symptomatic care - see instructions on AVS  Cover with augmentin Adv mucinex for congestion otc  Update if not starting to improve in a week or if worsening      Relevant Medications      amoxicillin-clavulanate (AUGMENTIN) tablet 875-125 mg

## 2014-09-18 NOTE — Assessment & Plan Note (Signed)
With L >R maxillary sinus pain, purulent nasal drainage and cough Disc symptomatic care - see instructions on AVS  Cover with augmentin Adv mucinex for congestion otc  Update if not starting to improve in a week or if worsening

## 2014-10-10 ENCOUNTER — Other Ambulatory Visit: Payer: Self-pay | Admitting: Family Medicine

## 2014-12-11 ENCOUNTER — Other Ambulatory Visit: Payer: Self-pay | Admitting: Family Medicine

## 2015-01-11 ENCOUNTER — Other Ambulatory Visit: Payer: Self-pay | Admitting: Family Medicine

## 2015-01-13 NOTE — Telephone Encounter (Signed)
Last f/u appt 05/2014 

## 2015-01-30 ENCOUNTER — Other Ambulatory Visit: Payer: Self-pay | Admitting: Family Medicine

## 2015-01-31 NOTE — Telephone Encounter (Signed)
Last lab 05/2014

## 2015-01-31 NOTE — Telephone Encounter (Signed)
It looks like she has not refilled this since 05/2014.  We need to recheck BMET before we can refill, preferably needs OV as well.

## 2015-02-04 ENCOUNTER — Ambulatory Visit (INDEPENDENT_AMBULATORY_CARE_PROVIDER_SITE_OTHER): Payer: Medicare HMO | Admitting: Family Medicine

## 2015-02-04 ENCOUNTER — Ambulatory Visit (INDEPENDENT_AMBULATORY_CARE_PROVIDER_SITE_OTHER)
Admission: RE | Admit: 2015-02-04 | Discharge: 2015-02-04 | Disposition: A | Discharge status: Home / Self Care | Payer: Medicare HMO | Source: Ambulatory Visit | Attending: Family Medicine | Admitting: Family Medicine

## 2015-02-04 ENCOUNTER — Encounter: Payer: Self-pay | Admitting: Family Medicine

## 2015-02-04 VITALS — BP 120/70 | HR 69 | Temp 98.0°F | Wt 137.8 lb

## 2015-02-04 DIAGNOSIS — M545 Low back pain, unspecified: Secondary | ICD-10-CM

## 2015-02-04 DIAGNOSIS — M79605 Pain in left leg: Secondary | ICD-10-CM

## 2015-02-04 DIAGNOSIS — M79604 Pain in right leg: Secondary | ICD-10-CM

## 2015-02-04 NOTE — Progress Notes (Signed)
Subjective:    Patient ID: Madison Miller, female    DOB: 1951/09/01, 64 y.o.   MRN: [id number]  Back Pain This is a chronic problem. The current episode started more than 1 year ago. The problem occurs intermittently. The problem has been gradually worsening since onset. The pain is present in the lumbar spine. The quality of the pain is described as shooting. The pain radiates to the right foot, left knee and left thigh. The pain is at a severity of 10/10. The pain is severe. The pain is worse during the day. The symptoms are aggravated by sitting and position. Stiffness is present at night. Associated symptoms include paresthesias and tingling. Pertinent negatives include no abdominal pain, bladder incontinence, bowel incontinence, chest pain, dysuria, fever, paresis, pelvic pain, perianal numbness or weakness. She has tried heat for the symptoms. The treatment provided mild relief.    I have not seen her since  05/2014.   H/o spinal stenosis s/p lumbar laminectomy- feels her symptoms are progressing.  Feels constant low back pain, not just with walking or changes in position.  Has seen Dr. Phoebe PerchHirsch but has not seen him in sometime. Wants to see another neurosurgeon.  No recent injury.  Patient Active Problem List   Diagnosis Date Noted  . Acute sinusitis 09/18/2014  . HTN (hypertension) 06/11/2014  . Chest pain 01/23/2012  . Claudication 01/23/2012  . PALPITATIONS 09/03/2010  . RBB W/ LFAB 10/14/2008  . SPINAL STENOSIS 07/17/2008  . ALLERGIC RHINITIS, SEASONAL 07/03/2008  . Backache 05/08/2008  . FOOT PAIN, BILATERAL 05/08/2008  . SUPRAVENTRICULAR TACHYCARDIA, HX OF 02/23/2008  . DYSPEPSIA 02/13/2008  . HYPERCALCEMIA 01/11/2008  . BACK PAIN, LUMBAR, WITH RADICULOPATHY 05/19/2007  . MIGRAINE NOS W/INTRACTABLE MIGRAINE 12/15/2006   Past Medical History  Diagnosis Date  . Hyperparathyroidism   . SVT (supraventricular tachycardia)   . PAC (premature atrial contraction)   .  Degenerative joint disease of cervical and lumbar spine   . Spondylosis   . Degenerative lumbar disc   . Radiculopathy   . Cardiac LV ejection fraction >40%   . GERD (gastroesophageal reflux disease)   . Chest pain   . Right bundle branch block and left anterior fascicular block   . Allergic rhinitis due to pollen   . Migraine, unspecified, with intractable migraine, so stated, without mention of status migrainosus   . Nephrolithiasis   . Gallstones   . CHF (congestive heart failure)   . HTN (hypertension)    Past Surgical History  Procedure Laterality Date  . Parathyroidectomy  2009  . Lumbar laminectomy  2010    Dr. Phoebe PerchHirsch   History  Substance Use Topics  . Smoking status: Never Smoker   . Smokeless tobacco: Never Used  . Alcohol Use: No   Family History  Problem Relation Age of Onset  . Other Mother     barin tumor  . Stroke Father   . Colon cancer Neg Hx   . Rectal cancer Neg Hx   . Stomach cancer Neg Hx    Allergies  Allergen Reactions  . Lactose Intolerance (Gi)     Pain,bloating   Current Outpatient Prescriptions on File Prior to Visit  Medication Sig Dispense Refill  . butalbital-acetaminophen-caffeine (FIORICET, ESGIC) 50-325-40 MG per tablet TAKE ONE TABLET BY MOUTH TWICE DAILY AS NEEDED FOR HEADACHE 60 tablet 0  . cholecalciferol (VITAMIN D) 1000 UNITS tablet Take 1,000 Units by mouth daily.    . furosemide (LASIX) 40 MG tablet TAKE 1  TABLET BY MOUTH DAILY 30 tablet 0  . lisinopril (PRINIVIL,ZESTRIL) 10 MG tablet TAKE 1 TABLET BY MOUTH DAILY 30 tablet 2  . metoprolol succinate (TOPROL-XL) 25 MG 24 hr tablet Take 0.5 tablets (12.5 mg total) by mouth daily. 30 tablet 2  . potassium chloride (K-DUR,KLOR-CON) 10 MEQ tablet Take 1 tablet (10 mEq total) by mouth 2 (two) times daily. 60 tablet 2   No current facility-administered medications on file prior to visit.   The PMH, PSH, Social History, Family History, Medications, and allergies have been reviewed [facility], and have been updated if relevant.     Review of Systems  Constitutional: Negative.  Negative for fever.  Cardiovascular: Negative for chest pain.  Gastrointestinal: Negative for abdominal pain and bowel incontinence.  Genitourinary: Negative for bladder incontinence, dysuria and pelvic pain.  Musculoskeletal: Positive for back pain.  Neurological: Positive for tingling and paresthesias. Negative for weakness.  All other systems reviewed and are negative.  See HPI   Objective:   Physical Exam BP 120/70 mmHg  Pulse 69  Temp(Src) 98 F (36.7 C) (Oral)  Wt 137 lb 12 oz (62.483 kg)  SpO2 97% General: NAD, thin  Neck: No JVD, no thyromegaly or thyroid nodule.  Lungs: Clear to auscultation bilaterally with normal respiratory effort.  CV: Nondisplaced PMI. Heart regular S1/S2, no S3/S4, widely split S2, no murmur. No peripheral edema. Abdomen: Soft, nontender, no hepatosplenomegaly, no distention.  Neurologic: Alert and oriented x 3.  Normal gait, pain elicited when she tries to bend and touch her toes, no pain with walking Psych: Normal affect.  Extremities: No clubbing or cyanosis.     Assessment & Plan:

## 2015-02-04 NOTE — Patient Instructions (Addendum)
Good to see you. We will call you with you xray results.  We are referring you to neurosurgeon.

## 2015-02-04 NOTE — Assessment & Plan Note (Signed)
Deteriorated. Xray today.  Given surgical history, she needs to follow up with neurosurgeon.  Per pt request, referral placed to see another neurosurgeon.

## 2015-02-04 NOTE — Progress Notes (Signed)
Pre visit review using our clinic review tool, if applicable. No additional management support is needed unless otherwise documented below in the visit note. 

## 2015-02-05 ENCOUNTER — Telehealth: Payer: Self-pay | Admitting: Family Medicine

## 2015-02-05 ENCOUNTER — Other Ambulatory Visit: Payer: Self-pay | Admitting: Family Medicine

## 2015-02-05 NOTE — Telephone Encounter (Signed)
Spoke to pt and advised per Dr Aron; pt verbally expressed understanding.  

## 2015-02-05 NOTE — Telephone Encounter (Signed)
Patient called and said she was seen by Dr.Aron yesterday and discussed with her that she's having finger pain on left hand.  [facility]

## 2015-02-05 NOTE — Telephone Encounter (Signed)
Pt had recent f/u appt but no labs. pls advise

## 2015-02-05 NOTE — Telephone Encounter (Signed)
I did not fully evaluate her finger.  She asked me a question about it.  It can take awhile to get into ortho.  She needs to go to a walk in clinic like we discussed.

## 2015-02-12 ENCOUNTER — Other Ambulatory Visit: Payer: Self-pay | Admitting: Neurosurgery

## 2015-02-12 DIAGNOSIS — M5416 Radiculopathy, lumbar region: Secondary | ICD-10-CM | POA: Diagnosis not present

## 2015-02-13 ENCOUNTER — Other Ambulatory Visit: Payer: Self-pay | Admitting: *Deleted

## 2015-02-13 NOTE — Telephone Encounter (Signed)
Is pt to have repeat labs before refill? pls advise

## 2015-02-13 NOTE — Telephone Encounter (Signed)
Pt left note at Triage requesting refill of Rx, please call pt at [phone]

## 2015-02-13 NOTE — Telephone Encounter (Signed)
Ok to refill one time only.  Needs labs for further refills. 

## 2015-02-14 MED ORDER — POTASSIUM CHLORIDE CRYS ER 10 MEQ PO TBCR: 10.0000 meq | EXTENDED_RELEASE_TABLET | Freq: Two times a day (BID) | ORAL | Status: DC

## 2015-02-18 ENCOUNTER — Other Ambulatory Visit: Payer: Self-pay | Admitting: Family Medicine

## 2015-02-18 ENCOUNTER — Encounter: Payer: Self-pay | Admitting: Primary Care

## 2015-02-18 ENCOUNTER — Ambulatory Visit (INDEPENDENT_AMBULATORY_CARE_PROVIDER_SITE_OTHER): Payer: Medicare HMO | Admitting: Primary Care

## 2015-02-18 VITALS — BP 120/70 | HR 72 | Temp 98.2°F | Ht 63.5 in | Wt 134.1 lb

## 2015-02-18 DIAGNOSIS — R059 Cough, unspecified: Secondary | ICD-10-CM

## 2015-02-18 DIAGNOSIS — R05 Cough: Secondary | ICD-10-CM

## 2015-02-18 MED ORDER — AZITHROMYCIN 250 MG PO TABS: ORAL_TABLET | ORAL | Status: DC

## 2015-02-18 NOTE — Progress Notes (Signed)
Pre visit review using our clinic review tool, if applicable. No additional management support is needed unless otherwise documented below in the visit note. 

## 2015-02-18 NOTE — Patient Instructions (Signed)
Start Azithromycin antibiotics. Take 2 tablets by mouth today, then take 1 tablet by mouth daily for 4 days. Try Robitussin cough syrup for your cough. This may be purchased over the counter. Ask the pharmacist.  Call me if you're not feeling better in the next 3-4 days. Drink plenty of water. It was nice meeting you!

## 2015-02-18 NOTE — Progress Notes (Signed)
Subjective:    Patient ID: Madison Miller, female    DOB: 01/20/1951, 64 y.o.   MRN: [id number]  HPI  Madison Miller is a 64 year old female who presents today with a chief complaint of sore thoat and cough that has been present for slightly over one week. Her cough is productive with yellowish sputum, and also  reports chest congestion. Overall she's feeling worse without any improvement in her symptoms. She's been taking cough drops without relief in her cough, but other wise has not taken anything OTC. Reports some chills, denies fevers (but has not checked her temperature).   Review of Systems  Constitutional: Negative for fever and chills.  HENT: Positive for congestion, rhinorrhea and sore throat. Negative for ear pain.   Respiratory: Positive for cough. Negative for shortness of breath.   Cardiovascular: Negative for chest pain.  Gastrointestinal: Negative for nausea and vomiting.  Musculoskeletal: Negative for myalgias.  Neurological: Negative for dizziness and headaches.       Past Medical History  Diagnosis Date  . Hyperparathyroidism   . SVT (supraventricular tachycardia)   . PAC (premature atrial contraction)   . Degenerative joint disease of cervical and lumbar spine   . Spondylosis   . Degenerative lumbar disc   . Radiculopathy   . Cardiac LV ejection fraction >40%   . GERD (gastroesophageal reflux disease)   . Chest pain   . Right bundle branch block and left anterior fascicular block   . Allergic rhinitis due to pollen   . Migraine, unspecified, with intractable migraine, so stated, without mention of status migrainosus   . Nephrolithiasis   . Gallstones   . CHF (congestive heart failure)   . HTN (hypertension)     History   Social History  . Marital Status: Married    Spouse Name: N/A  . Number of Children: 6  . Years of Education: N/A   Occupational History  . Not on file.   Social History Main Topics  . Smoking status: Never Smoker   .  Smokeless tobacco: Never Used  . Alcohol Use: No  . Drug Use: No  . Sexual Activity: Not on file   Other Topics Concern  . Not on file   Social History Narrative    Past Surgical History  Procedure Laterality Date  . Parathyroidectomy  2009  . Lumbar laminectomy  2010    Dr. Phoebe Perch    Family History  Problem Relation Age of Onset  . Other Mother     barin tumor  . Stroke Father   . Colon cancer Neg Hx   . Rectal cancer Neg Hx   . Stomach cancer Neg Hx     Allergies  Allergen Reactions  . Lactose Intolerance (Gi)     Pain,bloating    Current Outpatient Prescriptions on File Prior to Visit  Medication Sig Dispense Refill  . butalbital-acetaminophen-caffeine (FIORICET, ESGIC) 50-325-40 MG per tablet TAKE ONE TABLET BY MOUTH TWICE DAILY AS NEEDED FOR HEADACHE 60 tablet 0  . cholecalciferol (VITAMIN D) 1000 UNITS tablet Take 1,000 Units by mouth daily.    . furosemide (LASIX) 40 MG tablet TAKE 1 TABLET BY MOUTH DAILY 30 tablet 0  . lisinopril (PRINIVIL,ZESTRIL) 10 MG tablet TAKE 1 TABLET BY MOUTH DAILY 30 tablet 2  . metoprolol succinate (TOPROL-XL) 25 MG 24 hr tablet Take 0.5 tablets (12.5 mg total) by mouth daily. 30 tablet 2  . potassium chloride (K-DUR,KLOR-CON) 10 MEQ tablet Take 1 tablet (10  mEq total) by mouth 2 (two) times daily. 60 tablet 0   No current facility-administered medications on file prior to visit.    BP 120/70 mmHg  Pulse 72  Temp(Src) 98.2 F (36.8 C) (Oral)  Ht 5' 3.5" (1.613 m)  Wt 134 lb 1.9 oz (60.836 kg)  BMI 23.38 kg/m2  SpO2 98%    Objective:   Physical Exam  Constitutional: She does not appear ill.  HENT:  Right Ear: Tympanic membrane and ear canal normal.  Left Ear: Tympanic membrane and ear canal normal.  Nose: Right sinus exhibits maxillary sinus tenderness. Right sinus exhibits no frontal sinus tenderness. Left sinus exhibits no maxillary sinus tenderness and no frontal sinus tenderness.  Mouth/Throat: Oropharynx is clear  and moist.  Eyes: Conjunctivae are normal. Pupils are equal, round, and reactive to light.  Neck: Neck supple.  Cardiovascular: Normal rate and regular rhythm.   Pulmonary/Chest: Effort normal and breath sounds normal.  Lymphadenopathy:    She has no cervical adenopathy.  Skin: Skin is warm and dry.          Assessment & Plan:  Cough:  Suspect bacterial involvement due to duration (over 7 days) and worsening symptoms of cough and fatigue. Cough present on exam with sinus tenderness to right maxillary region Start Zpak today. Robitussin OTC for cough. Push fluids, rest. Follow up if no improvement in 3-4 days.

## 2015-02-27 ENCOUNTER — Encounter: Payer: Self-pay | Admitting: Family Medicine

## 2015-02-27 ENCOUNTER — Other Ambulatory Visit: Payer: Self-pay | Admitting: Family Medicine

## 2015-03-04 ENCOUNTER — Ambulatory Visit
Admission: RE | Admit: 2015-03-04 | Discharge: 2015-03-04 | Disposition: A | Discharge status: Home / Self Care | Payer: Medicare Other | Source: Ambulatory Visit | Attending: Neurosurgery | Admitting: Neurosurgery

## 2015-03-04 ENCOUNTER — Encounter (HOSPITAL_COMMUNITY): Payer: Self-pay | Admitting: Emergency Medicine

## 2015-03-04 ENCOUNTER — Emergency Department (HOSPITAL_COMMUNITY): Payer: Medicare Other

## 2015-03-04 ENCOUNTER — Emergency Department (HOSPITAL_COMMUNITY)
Admission: EM | Admit: 2015-03-04 | Discharge: 2015-03-04 | Disposition: A | Discharge status: Home / Self Care | Payer: Medicare Other | Attending: Emergency Medicine | Admitting: Emergency Medicine

## 2015-03-04 DIAGNOSIS — Z8719 Personal history of other diseases of the digestive system: Secondary | ICD-10-CM | POA: Diagnosis not present

## 2015-03-04 DIAGNOSIS — M4726 Other spondylosis with radiculopathy, lumbar region: Secondary | ICD-10-CM | POA: Diagnosis not present

## 2015-03-04 DIAGNOSIS — Z8639 Personal history of other endocrine, nutritional and metabolic disease: Secondary | ICD-10-CM | POA: Insufficient documentation

## 2015-03-04 DIAGNOSIS — I509 Heart failure, unspecified: Secondary | ICD-10-CM | POA: Insufficient documentation

## 2015-03-04 DIAGNOSIS — I10 Essential (primary) hypertension: Secondary | ICD-10-CM | POA: Diagnosis not present

## 2015-03-04 DIAGNOSIS — M79642 Pain in left hand: Secondary | ICD-10-CM | POA: Diagnosis present

## 2015-03-04 DIAGNOSIS — Z79899 Other long term (current) drug therapy: Secondary | ICD-10-CM | POA: Insufficient documentation

## 2015-03-04 DIAGNOSIS — G43919 Migraine, unspecified, intractable, without status migrainosus: Secondary | ICD-10-CM | POA: Insufficient documentation

## 2015-03-04 DIAGNOSIS — M5416 Radiculopathy, lumbar region: Secondary | ICD-10-CM

## 2015-03-04 DIAGNOSIS — M5126 Other intervertebral disc displacement, lumbar region: Secondary | ICD-10-CM | POA: Diagnosis not present

## 2015-03-04 DIAGNOSIS — Z87442 Personal history of urinary calculi: Secondary | ICD-10-CM | POA: Diagnosis not present

## 2015-03-04 DIAGNOSIS — M6530 Trigger finger, unspecified finger: Secondary | ICD-10-CM

## 2015-03-04 DIAGNOSIS — M19042 Primary osteoarthritis, left hand: Secondary | ICD-10-CM | POA: Diagnosis not present

## 2015-03-04 DIAGNOSIS — M65342 Trigger finger, left ring finger: Secondary | ICD-10-CM | POA: Diagnosis not present

## 2015-03-04 NOTE — Discharge Instructions (Signed)
Trigger Finger Trigger finger (digital tendinitis and stenosing tenosynovitis) is a common disorder that causes an often painful catching of the fingers or thumb. It occurs as a clicking, snapping, or locking of a finger in the palm of the hand. This is caused by a problem with the tendons that flex or bend the fingers sliding smoothly through their sheaths. The condition may occur in any finger or a couple fingers at the same time.  The finger may lock with the finger curled or suddenly straighten out with a snap. This is more common in patients with rheumatoid arthritis and diabetes. Left untreated, the condition may get worse to the point where the finger becomes locked in flexion, like making a fist, or less commonly locked with the finger straightened out. CAUSES   Inflammation and scarring that lead to swelling around the tendon sheath.  Repeated or forceful movements.  Rheumatoid arthritis, an autoimmune disease that affects joints.  Gout.  Diabetes mellitus. SIGNS AND SYMPTOMS  Soreness and swelling of your finger.  A painful clicking or snapping as you bend and straighten your finger. DIAGNOSIS  Your health care provider will do a physical exam of your finger to diagnose trigger finger. TREATMENT   Splinting for 6-8 weeks may be helpful.  Nonsteroidal anti-inflammatory medicines (NSAIDs) can help to relieve the pain and inflammation.  Cortisone injections, along with splinting, may speed up recovery. Several injections may be required. Cortisone may give relief after one injection.  Surgery is another treatment that may be used if conservative treatments do not work. Surgery can be minor, without incisions (a cut does not have to be made), and can be done with a needle through the skin.  Other surgical choices involve an open procedure in which the surgeon opens the hand through a small incision and cuts the pulley so the tendon can again slide smoothly. Your hand will still  work fine. HOME CARE INSTRUCTIONS  Apply ice to the injured area, twice per day:  Put ice in a plastic bag.  Place a towel between your skin and the bag.  Leave the ice on for 20 minutes, 3-4 times a day.  Rest your hand often. MAKE SURE YOU:   Understand these instructions.  Will watch your condition.  Will get help right away if you are not doing well or get worse. Document Released: 06/26/2004 Document Revised: 05/09/2013 Document Reviewed: 02/06/2013 Exit[employer] Patient Information 2015 [employer]. This information is not intended to replace advice given to you by your health care provider. Make sure you discuss any questions you have with your health care provider.  

## 2015-03-04 NOTE — ED Provider Notes (Signed)
CSN: [id number]     Arrival date & time 03/04/15  1234 History  This chart was scribed for non-physician practitioner, Teressa Lower, NP, working with Mancel Bale, MD, by Ronney Lion, ED Scribe. This patient was seen in room TR06C/TR06C and the patient's care was started at 12:50 PM.    Chief Complaint  Patient presents with  . Hand Pain   The history is provided by the patient. No language interpreter was used.    HPI Comments: Madison Miller is a 64 y.o. female who presents to the Emergency Department complaining of constant, left fourth finger pain that began 2 weeks ago. She also complains of feeling unable to move her finger out of a constantly contracted state. She denies any known injury. She states it has sometimes hurt in the past, which she was able to relieve with a massage and a hot compress, but she states this hasn't worked this time.   Past Medical History  Diagnosis Date  . Hyperparathyroidism   . SVT (supraventricular tachycardia)   . PAC (premature atrial contraction)   . Degenerative joint disease of cervical and lumbar spine   . Spondylosis   . Degenerative lumbar disc   . Radiculopathy   . Cardiac LV ejection fraction >40%   . GERD (gastroesophageal reflux disease)   . Chest pain   . Right bundle branch block and left anterior fascicular block   . Allergic rhinitis due to pollen   . Migraine, unspecified, with intractable migraine, so stated, without mention of status migrainosus   . Nephrolithiasis   . Gallstones   . CHF (congestive heart failure)   . HTN (hypertension)    Past Surgical History  Procedure Laterality Date  . Parathyroidectomy  2009  . Lumbar laminectomy  2010    Dr. Phoebe Perch   Family History  Problem Relation Age of Onset  . Other Mother     barin tumor  . Stroke Father   . Colon cancer Neg Hx   . Rectal cancer Neg Hx   . Stomach cancer Neg Hx    History  Substance Use Topics  . Smoking status: Never Smoker   . Smokeless  tobacco: Never Used  . Alcohol Use: No   OB History    No data available     Review of Systems  Musculoskeletal: Positive for myalgias (left fourth finger pain).  All other systems reviewed and are negative.  Allergies  Lactose intolerance (gi)  Home Medications   Prior to Admission medications   Medication Sig Start Date End Date Taking? Authorizing Provider  azithromycin (ZITHROMAX) 250 MG tablet Take 2 tablets by mouth today, then 1 tablet by mouth daily for 4 days. 02/18/15   Doreene Nest, NP  butalbital-acetaminophen-caffeine (FIORICET, ESGIC) [phone] MG per tablet TAKE ONE TABLET BY MOUTH TWICE DAILY AS NEEDED FOR HEADACHE 10/12/13   Dianne Dun, MD  cholecalciferol (VITAMIN D) 1000 UNITS tablet Take 1,000 Units by mouth daily.    Historical Provider, MD  furosemide (LASIX) 40 MG tablet TAKE 1 TABLET BY MOUTH DAILY 02/19/15   Dianne Dun, MD  lisinopril (PRINIVIL,ZESTRIL) 10 MG tablet TAKE 1 TABLET BY MOUTH DAILY 02/27/15   Dianne Dun, MD  metoprolol succinate (TOPROL-XL) 25 MG 24 hr tablet Take 0.5 tablets (12.5 mg total) by mouth daily. 06/11/14   Dianne Dun, MD  potassium chloride (K-DUR,KLOR-CON) 10 MEQ tablet Take 1 tablet (10 mEq total) by mouth 2 (two) times daily. 02/14/15  Dianne Dun, MD   BP 125/84 mmHg  Pulse 72  Temp(Src) 98.6 F (37 C) (Oral)  Resp 17  Ht 5\' 3"  (1.6 m)  Wt 133 lb (60.328 kg)  BMI 23.57 kg/m2  SpO2 97% Physical Exam  Constitutional: She is oriented to person, place, and time. She appears well-developed and well-nourished. No distress.  HENT:  Head: Normocephalic and atraumatic.  Eyes: Conjunctivae and EOM are normal.  Neck: Neck supple. No tracheal deviation present.  Cardiovascular: Normal rate.   Pulmonary/Chest: Effort normal. No respiratory distress.  Musculoskeletal: Normal range of motion.  Flexion of the left fourth digit. No redness or warmth noted  Neurological: She is alert and oriented to person, place, and time.   Skin: Skin is warm and dry.  Psychiatric: She has a normal mood and affect. Her behavior is normal.  Nursing note and vitals reviewed.   ED Course  Procedures (including critical care time)  DIAGNOSTIC STUDIES: Oxygen Saturation is 97% on RA, normal by my interpretation.    COORDINATION OF CARE: 12:57 PM - Discussed treatment plan with pt at bedside which includes left hand XR, and pt agreed to plan.  MDM   Final diagnoses:  Trigger finger   Pt to see hand. No sign of infection noted. nuerovascularly intact. Discussed use of nsaids.  I personally performed the services described in this documentation, which was scribed in my presence. The recorded information has been reviewed and is accurate.   Teressa Lower, NP 03/04/15 1429  Mancel Bale, MD 03/05/15 [phone]

## 2015-03-04 NOTE — ED Notes (Signed)
Pt. Stated, my finger ha been like this for a week. Pt's finger in a contraction , rt. Ring finger for a week.

## 2015-03-05 ENCOUNTER — Telehealth: Payer: Self-pay | Admitting: Family Medicine

## 2015-03-05 NOTE — Telephone Encounter (Signed)
Spoke to Madison Miller who is agreeable with seeing Dr Patsy Lager and appt scheduled .

## 2015-03-05 NOTE — Telephone Encounter (Signed)
Pt called wanting to know if dr Dayton Martes will referr her to hand specialist. She is having problem with left hand ring finger.  She cannot straighten it up

## 2015-03-05 NOTE — Telephone Encounter (Signed)
Dr. Patsy Lager may be able to help her.  Would she like to see him first?

## 2015-03-13 ENCOUNTER — Encounter: Payer: Self-pay | Admitting: Family Medicine

## 2015-03-13 ENCOUNTER — Ambulatory Visit (INDEPENDENT_AMBULATORY_CARE_PROVIDER_SITE_OTHER): Payer: Medicare Other | Admitting: Family Medicine

## 2015-03-13 VITALS — BP 116/68 | HR 61 | Temp 97.9°F | Ht 63.5 in | Wt 134.5 lb

## 2015-03-13 DIAGNOSIS — M79645 Pain in left finger(s): Secondary | ICD-10-CM | POA: Diagnosis not present

## 2015-03-13 DIAGNOSIS — M65342 Trigger finger, left ring finger: Secondary | ICD-10-CM | POA: Diagnosis not present

## 2015-03-13 DIAGNOSIS — M24542 Contracture, left hand: Secondary | ICD-10-CM | POA: Diagnosis not present

## 2015-03-13 MED ORDER — METHYLPREDNISOLONE ACETATE 40 MG/ML IJ SUSP
20.0000 mg | Freq: Once | INTRAMUSCULAR | Status: AC
  Administered 2015-03-13: 20 mg via INTRA_ARTICULAR

## 2015-03-13 NOTE — Progress Notes (Signed)
Dr. Karleen Hampshire T. Davine Sweney, MD, CAQ Sports Medicine [facility] [address] [place], [address] Phone: [phone] Fax: [phone]  03/13/2015  Patient: Madison Miller, MRN: [id number], DOB: 08/05/1951, 64 y.o.  Primary Physician:  [name], MD  Chief Complaint: Hand Pain  Subjective:   Madison Miller is a 64 y.o. very pleasant female patient who presents with the following:  Very pleasant patient who is been having pain on her fourth digit on the left hand for quite some time, over the length of a number of months.  She has a painful nodule at the base of the volar aspect of her hand.  This is at the fourth MCP, and it catches and she has had significant triggering for some time.  It is become even more painful, so now she leaves it in a flexed position.  She thinks that she is able to get it to straighten out sometimes, but she only does so when she bangs it on something.  Past Medical History, Surgical History, Social History, Family History, Problem List, Medications, and Allergies have been reviewed and updated if relevant.  Patient Active Problem List   Diagnosis Date Noted  . Acute sinusitis 09/18/2014  . HTN (hypertension) 06/11/2014  . Chest pain 01/23/2012  . Claudication 01/23/2012  . PALPITATIONS 09/03/2010  . RBB W/ LFAB 10/14/2008  . SPINAL STENOSIS 07/17/2008  . ALLERGIC RHINITIS, SEASONAL 07/03/2008  . Backache 05/08/2008  . FOOT PAIN, BILATERAL 05/08/2008  . SUPRAVENTRICULAR TACHYCARDIA, HX OF 02/23/2008  . DYSPEPSIA 02/13/2008  . HYPERCALCEMIA 01/11/2008  . Lumbar pain with radiation down both legs 05/19/2007  . MIGRAINE NOS W/INTRACTABLE MIGRAINE 12/15/2006    Past Medical History  Diagnosis Date  . Hyperparathyroidism   . SVT (supraventricular tachycardia)   . PAC (premature atrial contraction)   . Degenerative joint disease of cervical and lumbar spine   . Spondylosis   . Degenerative lumbar disc   .  Radiculopathy   . Cardiac LV ejection fraction >40%   . GERD (gastroesophageal reflux disease)   . Chest pain   . Right bundle branch block and left anterior fascicular block   . Allergic rhinitis due to pollen   . Migraine, unspecified, with intractable migraine, so stated, without mention of status migrainosus   . Nephrolithiasis   . Gallstones   . CHF (congestive heart failure)   . HTN (hypertension)     Past Surgical History  Procedure Laterality Date  . Parathyroidectomy  2009  . Lumbar laminectomy  2010    Dr. Phoebe Perch    History   Social History  . Marital Status: Married    Spouse Name: N/A  . Number of Children: 6  . Years of Education: N/A   Occupational History  . Not on file.   Social History Main Topics  . Smoking status: Never Smoker   . Smokeless tobacco: Never Used  . Alcohol Use: No  . Drug Use: No  . Sexual Activity: Not on file   Other Topics Concern  . Not on file   Social History Narrative    Family History  Problem Relation Age of Onset  . Other Mother     barin tumor  . Stroke Father   . Colon cancer Neg Hx   . Rectal cancer Neg Hx   . Stomach cancer Neg Hx     Allergies  Allergen Reactions  . Lactose Intolerance (Gi) Other (See Comments)    Pain,bloating  Medication list reviewed and updated in full in [facility].  GEN: No fevers, chills. Nontoxic. Primarily MSK c/o today. MSK: Detailed in the HPI GI: tolerating PO intake without difficulty Neuro: No numbness, parasthesias, or tingling associated. Otherwise the pertinent positives of the ROS are noted above.   Objective:   BP 116/68 mmHg  Pulse 61  Temp(Src) 97.9 F (36.6 C) (Oral)  Ht 5' 3.5" (1.613 m)  Wt 134 lb 8 oz (61.009 kg)  BMI 23.45 kg/m2  SpO2 97%   GEN: WDWN, NAD, Non-toxic, Alert & Oriented x 3 HEENT: Atraumatic, Normocephalic.  Ears and Nose: No external deformity. EXTR: No clubbing/cyanosis/edema NEURO: Normal gait.  PSYCH: Normally  interactive. Conversant. Not depressed or anxious appearing.  Calm demeanor.    Left hand: There is a flexion contracture of the left fourth digit that is notably tender to palpation.  She resists my attempts to extend the finger.  She has a painful nodule at the MCP joint.  This does move and seems to catch at the A1 pulley.  Radiology:  Assessment and Plan:   Trigger ring finger, left  Pain of finger of left hand - Plan: methylPREDNISolone acetate (DEPO-MEDROL) injection 20 mg  Contracture of finger joint, left  Trigger finger that may have progressed to the point of becoming a flexion contracture deformity.  The patient is very resistant to me attempting to manipulate this in the office.  Adequate trigger finger injection was achieved.  Postanesthesia, additional examination was attempted.  Decreased pain, however I was not able to fully extend the patient's finger.  There does appear to be to some degree of contracture.  I recommended that the patient massaged her nodule over the next few weeks and attempt to work on her range of motion.  If this does not recover, I would urge her to seek hand surgical consultation so that she does not lose the use of that finger.  Trigger Finger Injection, LEFT, 4th Verbal consent was obtained. Risks (including rare risk of infection, potential risk for skin lightening and potential atrophy), benefits and alternatives were discussed. Prepped with Chloraprep and Ethyl Chloride used for anesthesia. Under sterile conditions, patient injected at palmar crease aiming distally with 45 degree angle towards nodule; injected directly into tendon sheath. Medication flowed freely without resistance.  Needle size: 22 gauge 1 1/2 inch Injection: 1/2 cc of Lidocaine 1% and Depo-Medrol 20 mg   Signed,  Latorsha Curling T. Eliaz Fout, MD   Patient's Medications  New Prescriptions   No medications on file  Previous Medications   BUTALBITAL-ACETAMINOPHEN-CAFFEINE (FIORICET,  ESGIC) 50-325-40 MG PER TABLET    TAKE ONE TABLET BY MOUTH TWICE DAILY AS NEEDED FOR HEADACHE   CHOLECALCIFEROL (VITAMIN D) 1000 UNITS TABLET    Take 1,000 Units by mouth daily.   FUROSEMIDE (LASIX) 40 MG TABLET    TAKE 1 TABLET BY MOUTH DAILY   LISINOPRIL (PRINIVIL,ZESTRIL) 10 MG TABLET    TAKE 1 TABLET BY MOUTH DAILY   METOPROLOL SUCCINATE (TOPROL-XL) 25 MG 24 HR TABLET    Take 0.5 tablets (12.5 mg total) by mouth daily.   POTASSIUM CHLORIDE (K-DUR,KLOR-CON) 10 MEQ TABLET    Take 1 tablet (10 mEq total) by mouth 2 (two) times daily.  Modified Medications   No medications on file  Discontinued Medications   AZITHROMYCIN (ZITHROMAX) 250 MG TABLET    Take 2 tablets by mouth today, then 1 tablet by mouth daily for 4 days.

## 2015-03-13 NOTE — Progress Notes (Signed)
Pre visit review using our clinic review tool, if applicable. No additional management support is needed unless otherwise documented below in the visit note. 

## 2015-03-25 ENCOUNTER — Other Ambulatory Visit: Payer: Self-pay | Admitting: Family Medicine

## 2015-03-25 NOTE — Telephone Encounter (Signed)
Is pt needing to continue? Last CMP 05/2014-normal.

## 2015-04-08 ENCOUNTER — Telehealth: Payer: Self-pay

## 2015-04-08 NOTE — Telephone Encounter (Signed)
Pt said that the Potassium causes stomach hurt and makes her feel tired, no N&V; pt has tried to take with and without food; pt wants to know if can stop Potassium or is there something else she can substitute for K med. Pt request cb. [place].

## 2015-04-08 NOTE — Telephone Encounter (Signed)
Spoke to pt who states last dose was "a few days ago;" repeat labs scheduled for 7/21

## 2015-04-08 NOTE — Telephone Encounter (Signed)
When did she last take her potassium?  Lets have her come to check a BMET to see what her potassium level is now and take it from there.

## 2015-04-10 ENCOUNTER — Other Ambulatory Visit (INDEPENDENT_AMBULATORY_CARE_PROVIDER_SITE_OTHER): Payer: Medicare Other

## 2015-04-10 ENCOUNTER — Encounter: Payer: Self-pay | Admitting: *Deleted

## 2015-04-10 DIAGNOSIS — I10 Essential (primary) hypertension: Secondary | ICD-10-CM | POA: Diagnosis not present

## 2015-04-10 LAB — BASIC METABOLIC PANEL
BUN: 15 mg/dL (ref 6–23)
CO2: 29 meq/L (ref 19–32)
CREATININE: 0.94 mg/dL (ref 0.40–1.20)
Calcium: 9.3 mg/dL (ref 8.4–10.5)
Chloride: 106 mEq/L (ref 96–112)
GFR: 77.21 mL/min (ref >60.00)
Glucose, Bld: 90 mg/dL (ref 70–99)
Potassium: 3.7 mEq/L (ref 3.5–5.1)
Sodium: 142 mEq/L (ref 135–145)

## 2015-04-14 ENCOUNTER — Telehealth: Payer: Self-pay | Admitting: Family Medicine

## 2015-04-14 DIAGNOSIS — M6530 Trigger finger, unspecified finger: Secondary | ICD-10-CM

## 2015-04-14 NOTE — Telephone Encounter (Signed)
Pt called to give Dr. Patsy Lager an update on the condition of her finger.  She says it is doing better but still  "drawing up" sometimes.   Pt requests call back on Wednesday morning if possible.  Best number to call back if needed is [phone]

## 2015-04-14 NOTE — Telephone Encounter (Signed)
done

## 2015-04-14 NOTE — Telephone Encounter (Signed)
If she still has significant contracture, hand surgical consultation would be reasonable.

## 2015-04-14 NOTE — Addendum Note (Signed)
Addended by: Hannah Beat on: 04/14/2015 05:50 PM   Modules accepted: Orders

## 2015-04-14 NOTE — Telephone Encounter (Signed)
Madison Miller notified as instructed by telephone.  She is agreeable to the hand surgeon referral.  Will forward note to Dr. Patsy Lager to order referral.

## 2015-04-15 ENCOUNTER — Telehealth: Payer: Self-pay

## 2015-04-15 NOTE — Telephone Encounter (Signed)
Patient declined any information about scheduling a Mammogram.  

## 2015-04-17 ENCOUNTER — Telehealth: Payer: Self-pay

## 2015-04-17 NOTE — Telephone Encounter (Signed)
Lm with pts family member requesting pt contact office back

## 2015-04-17 NOTE — Telephone Encounter (Signed)
I would suggest cutting the potassium dose in half and taking 5 meq daily instead.  Then we can recheck her potassium again in 4 weeks.

## 2015-04-17 NOTE — Telephone Encounter (Signed)
Pt got lab results in mail; K was normal and pt has not been taking K for 1 - 2  Weeks because K med causes stomach cramping; no N&V or diarrhea. Pt wants to know if she can stop K med all the time and pt wants to know if she should continue taking the furosemide since not taking the K. [place].

## 2015-04-17 NOTE — Telephone Encounter (Signed)
Pt left vm returning call. °

## 2015-04-18 NOTE — Telephone Encounter (Signed)
Spoke to pt and advised. Pt verbally expressed understanding and F/u labs scheduled.

## 2015-04-23 ENCOUNTER — Other Ambulatory Visit: Payer: Self-pay | Admitting: Family Medicine

## 2015-05-05 DIAGNOSIS — M65342 Trigger finger, left ring finger: Secondary | ICD-10-CM | POA: Diagnosis not present

## 2015-05-21 ENCOUNTER — Other Ambulatory Visit: Payer: Medicare Other

## 2015-05-22 ENCOUNTER — Other Ambulatory Visit (INDEPENDENT_AMBULATORY_CARE_PROVIDER_SITE_OTHER): Payer: Medicare Other

## 2015-05-22 DIAGNOSIS — I10 Essential (primary) hypertension: Secondary | ICD-10-CM

## 2015-05-22 LAB — POTASSIUM: Potassium: 4.2 mEq/L (ref 3.5–5.1)

## 2015-05-23 ENCOUNTER — Encounter: Payer: Self-pay | Admitting: *Deleted

## 2015-06-21 ENCOUNTER — Other Ambulatory Visit: Payer: Self-pay | Admitting: Family Medicine

## 2015-07-09 ENCOUNTER — Other Ambulatory Visit: Payer: Self-pay | Admitting: Family Medicine

## 2015-08-27 ENCOUNTER — Telehealth: Payer: Self-pay | Admitting: Family Medicine

## 2015-08-27 NOTE — Telephone Encounter (Signed)
Pt dropped off [employer] handicap plate form. Please call [phone] when ready to be picked up placing in rx tower Thank you\

## 2015-09-01 NOTE — Telephone Encounter (Signed)
Spoke to pt and informed her paperwork is available for pickup from the front desk 

## 2015-09-01 NOTE — Telephone Encounter (Signed)
Form signed and in my box. 

## 2015-09-23 ENCOUNTER — Other Ambulatory Visit: Payer: Self-pay | Admitting: Family Medicine

## 2015-09-24 NOTE — Telephone Encounter (Signed)
Last BMP normal. pls advise

## 2015-10-02 ENCOUNTER — Telehealth: Payer: Self-pay | Admitting: Family Medicine

## 2015-10-02 NOTE — Telephone Encounter (Signed)
Pt dropped off form for dmv, she is wanting it done asap.  cb number is [phone] Thank you

## 2015-10-03 ENCOUNTER — Telehealth: Payer: Self-pay | Admitting: *Deleted

## 2015-10-03 NOTE — Telephone Encounter (Signed)
Pt called back checking on form

## 2015-10-03 NOTE — Telephone Encounter (Signed)
Patient called the office with complaints of left sided shoulder and chest pain that remains unchanged x2 days.  No other symptoms.  No appointments available.  Patient advised to report to urgent care or ED for immediate evaluation.  Patient verbalizes understanding and states she will go to nearest ED.

## 2015-10-06 ENCOUNTER — Encounter (HOSPITAL_COMMUNITY): Payer: Self-pay | Admitting: Family Medicine

## 2015-10-06 ENCOUNTER — Emergency Department (HOSPITAL_COMMUNITY): Payer: Medicare Other

## 2015-10-06 ENCOUNTER — Observation Stay (HOSPITAL_COMMUNITY)
Admission: EM | Admit: 2015-10-06 | Discharge: 2015-10-07 | Disposition: A | Discharge status: Home / Self Care | Payer: Medicare Other | Attending: Internal Medicine | Admitting: Internal Medicine

## 2015-10-06 DIAGNOSIS — Z79899 Other long term (current) drug therapy: Secondary | ICD-10-CM | POA: Insufficient documentation

## 2015-10-06 DIAGNOSIS — I10 Essential (primary) hypertension: Secondary | ICD-10-CM | POA: Diagnosis not present

## 2015-10-06 DIAGNOSIS — R110 Nausea: Secondary | ICD-10-CM | POA: Insufficient documentation

## 2015-10-06 DIAGNOSIS — Z87442 Personal history of urinary calculi: Secondary | ICD-10-CM | POA: Insufficient documentation

## 2015-10-06 DIAGNOSIS — M545 Low back pain, unspecified: Secondary | ICD-10-CM | POA: Diagnosis present

## 2015-10-06 DIAGNOSIS — E213 Hyperparathyroidism, unspecified: Secondary | ICD-10-CM | POA: Insufficient documentation

## 2015-10-06 DIAGNOSIS — G43919 Migraine, unspecified, intractable, without status migrainosus: Secondary | ICD-10-CM | POA: Insufficient documentation

## 2015-10-06 DIAGNOSIS — I491 Atrial premature depolarization: Secondary | ICD-10-CM | POA: Insufficient documentation

## 2015-10-06 DIAGNOSIS — M47816 Spondylosis without myelopathy or radiculopathy, lumbar region: Secondary | ICD-10-CM | POA: Diagnosis not present

## 2015-10-06 DIAGNOSIS — I452 Bifascicular block: Secondary | ICD-10-CM | POA: Diagnosis not present

## 2015-10-06 DIAGNOSIS — R079 Chest pain, unspecified: Secondary | ICD-10-CM | POA: Diagnosis not present

## 2015-10-06 DIAGNOSIS — M79604 Pain in right leg: Secondary | ICD-10-CM | POA: Diagnosis present

## 2015-10-06 DIAGNOSIS — M47812 Spondylosis without myelopathy or radiculopathy, cervical region: Secondary | ICD-10-CM | POA: Diagnosis not present

## 2015-10-06 DIAGNOSIS — K219 Gastro-esophageal reflux disease without esophagitis: Secondary | ICD-10-CM | POA: Diagnosis not present

## 2015-10-06 DIAGNOSIS — I509 Heart failure, unspecified: Secondary | ICD-10-CM | POA: Insufficient documentation

## 2015-10-06 DIAGNOSIS — M79605 Pain in left leg: Secondary | ICD-10-CM | POA: Diagnosis present

## 2015-10-06 DIAGNOSIS — R0789 Other chest pain: Secondary | ICD-10-CM | POA: Insufficient documentation

## 2015-10-06 LAB — BASIC METABOLIC PANEL
ANION GAP: 9 (ref 5–15)
BUN: 19 mg/dL (ref 6–20)
CO2: 30 mmol/L (ref 22–32)
Calcium: 9.6 mg/dL (ref 8.9–10.3)
Chloride: 104 mmol/L (ref 101–111)
Creatinine, Ser: 1.03 mg/dL — ABNORMAL HIGH (ref 0.44–1.00)
GFR calc Af Amer: >60 mL/min (ref >60)
GFR, EST NON AFRICAN AMERICAN: 56 mL/min — AB (ref >60)
GLUCOSE: 99 mg/dL (ref 65–99)
POTASSIUM: 3.8 mmol/L (ref 3.5–5.1)
Sodium: 143 mmol/L (ref 135–145)

## 2015-10-06 LAB — CBC
HEMATOCRIT: 39.4 % (ref 36.0–46.0)
HEMOGLOBIN: 12.8 g/dL (ref 12.0–15.0)
MCH: 30 pg (ref 26.0–34.0)
MCHC: 32.5 g/dL (ref 30.0–36.0)
MCV: 92.3 fL (ref 78.0–100.0)
Platelets: 229 10*3/uL (ref 150–400)
RBC: 4.27 MIL/uL (ref 3.87–5.11)
RDW: 13.8 % (ref 11.5–15.5)
WBC: 5.6 10*3/uL (ref 4.0–10.5)

## 2015-10-06 LAB — I-STAT TROPONIN, ED
Troponin i, poc: 0.01 ng/mL (ref 0.00–0.08)
Troponin i, poc: 0.01 ng/mL (ref 0.00–0.08)

## 2015-10-06 LAB — LIPID PANEL
CHOL/HDL RATIO: 3.4 ratio
Cholesterol: 158 mg/dL (ref 0–200)
HDL: 47 mg/dL (ref >40)
LDL CALC: 95 mg/dL (ref 0–99)
Triglycerides: 79 mg/dL (ref <150)
VLDL: 16 mg/dL (ref 0–40)

## 2015-10-06 LAB — TROPONIN I: Troponin I: <0.03 ng/mL (ref <0.031)

## 2015-10-06 LAB — TSH: TSH: 1.285 u[IU]/mL (ref 0.350–4.500)

## 2015-10-06 MED ORDER — ENOXAPARIN SODIUM 40 MG/0.4ML ~~LOC~~ SOLN: 40.0000 mg | SUBCUTANEOUS | Status: DC

## 2015-10-06 MED ORDER — ACETAMINOPHEN 325 MG PO TABS: 650.0000 mg | ORAL_TABLET | ORAL | Status: DC | PRN

## 2015-10-06 MED ORDER — ASPIRIN 81 MG PO CHEW
324.0000 mg | CHEWABLE_TABLET | Freq: Once | ORAL | Status: AC
  Administered 2015-10-06: 324 mg via ORAL
  Filled 2015-10-06: qty 4

## 2015-10-06 MED ORDER — NAPROXEN SODIUM 275 MG PO TABS
440.0000 mg | ORAL_TABLET | Freq: Two times a day (BID) | ORAL | Status: DC | PRN
  Filled 2015-10-06: qty 2

## 2015-10-06 MED ORDER — METOPROLOL SUCCINATE ER 25 MG PO TB24
25.0000 mg | ORAL_TABLET | Freq: Every day | ORAL | Status: DC
  Filled 2015-10-06: qty 1

## 2015-10-06 MED ORDER — ONDANSETRON HCL 4 MG/2ML IJ SOLN: 4.0000 mg | Freq: Four times a day (QID) | INTRAMUSCULAR | Status: DC | PRN

## 2015-10-06 MED ORDER — ACETAMINOPHEN 500 MG PO TABS: 500.0000 mg | ORAL_TABLET | Freq: Two times a day (BID) | ORAL | Status: DC | PRN

## 2015-10-06 MED ORDER — LISINOPRIL 10 MG PO TABS
10.0000 mg | ORAL_TABLET | Freq: Every day | ORAL | Status: DC
  Filled 2015-10-06: qty 1

## 2015-10-06 MED ORDER — NAPROXEN 250 MG PO TABS
375.0000 mg | ORAL_TABLET | Freq: Two times a day (BID) | ORAL | Status: DC | PRN
Start: 2015-10-06 — End: 2015-10-07

## 2015-10-06 MED ORDER — GI COCKTAIL ~~LOC~~: 30.0000 mL | Freq: Three times a day (TID) | ORAL | Status: DC | PRN

## 2015-10-06 MED ORDER — FUROSEMIDE 40 MG PO TABS
40.0000 mg | ORAL_TABLET | Freq: Every day | ORAL | Status: DC
  Administered 2015-10-07: 40 mg via ORAL
  Filled 2015-10-06: qty 1

## 2015-10-06 MED ORDER — IBUPROFEN 200 MG PO TABS: 400.0000 mg | ORAL_TABLET | Freq: Two times a day (BID) | ORAL | Status: DC | PRN

## 2015-10-06 MED ORDER — NITROGLYCERIN 0.4 MG SL SUBL: 0.4000 mg | SUBLINGUAL_TABLET | SUBLINGUAL | Status: DC | PRN

## 2015-10-06 NOTE — ED Provider Notes (Signed)
CSN: [id number]     Arrival date & time 10/06/15  1206 History   First MD Initiated Contact with Patient 10/06/15 1458     Chief Complaint  Patient presents with  . Chest Pain     (Consider location/radiation/quality/duration/timing/severity/associated sxs/prior Treatment) Patient is a 65 y.o. female presenting with chest pain. The history is provided by the patient.  Chest Pain Pain location:  Substernal area and L chest Pain quality: pressure   Pain radiates to:  Upper back and L shoulder Pain radiates to the back: yes   Pain severity:  Mild Onset quality:  Gradual Duration:  1 week Timing:  Intermittent Progression:  Waxing and waning Chronicity:  New Context: movement and at rest   Context: no stress and no trauma   Relieved by:  Rest Worsened by:  Movement and exertion Ineffective treatments:  None tried Associated symptoms: nausea   Associated symptoms: no abdominal pain, no altered mental status, no cough, no fever, no lower extremity edema, no shortness of breath, no syncope and not vomiting     Past Medical History  Diagnosis Date  . Hyperparathyroidism   . SVT (supraventricular tachycardia) (HCC)   . PAC (premature atrial contraction)   . Degenerative joint disease of cervical and lumbar spine   . Spondylosis   . Degenerative lumbar disc   . Radiculopathy   . Cardiac LV ejection fraction >40%   . GERD (gastroesophageal reflux disease)   . Chest pain   . Right bundle branch block and left anterior fascicular block   . Allergic rhinitis due to pollen   . Migraine, unspecified, with intractable migraine, so stated, without mention of status migrainosus   . Nephrolithiasis   . Gallstones   . CHF (congestive heart failure) (HCC)   . HTN (hypertension)    Past Surgical History  Procedure Laterality Date  . Parathyroidectomy  2009  . Lumbar laminectomy  2010    Dr. Phoebe Perch   Family History  Problem Relation Age of Onset  . Other Mother     barin tumor  .  Stroke Father   . Colon cancer Neg Hx   . Rectal cancer Neg Hx   . Stomach cancer Neg Hx    Social History  Substance Use Topics  . Smoking status: Never Smoker   . Smokeless tobacco: Never Used  . Alcohol Use: No   OB History    No data available     Review of Systems  Constitutional: Negative for fever and activity change.  Eyes: Negative for visual disturbance.  Respiratory: Negative for cough and shortness of breath.   Cardiovascular: Positive for chest pain. Negative for leg swelling and syncope.  Gastrointestinal: Positive for nausea. Negative for vomiting, abdominal pain and diarrhea.  Genitourinary: Negative.   Musculoskeletal: Negative.   Skin: Negative.   Neurological: Negative for syncope.      Allergies  Lactose intolerance (gi)  Home Medications   Prior to Admission medications   Medication Sig Start Date End Date Taking? Authorizing Provider  butalbital-acetaminophen-caffeine (FIORICET, ESGIC) 50-325-40 MG per tablet TAKE ONE TABLET BY MOUTH TWICE DAILY AS NEEDED FOR HEADACHE 10/12/13   Dianne Dun, MD  cholecalciferol (VITAMIN D) 1000 UNITS tablet Take 1,000 Units by mouth daily.    Historical Provider, MD  furosemide (LASIX) 40 MG tablet TAKE 1 TABLET BY MOUTH DAILY 09/24/15   Dianne Dun, MD  lisinopril (PRINIVIL,ZESTRIL) 10 MG tablet TAKE 1 TABLET BY MOUTH DAILY 02/27/15   Bryn Gulling  Dayton MartesAron, MD  metoprolol succinate (TOPROL-XL) 25 MG 24 hr tablet TAKE 1/2 TABLET BY MOUTH EVERY DAY. 07/10/15   Dianne Dunalia M Aron, MD  potassium chloride (K-DUR,KLOR-CON) 10 MEQ tablet Take 0.5 tablets (5 mEq total) by mouth daily. 04/23/15   Dianne Dunalia M Aron, MD   BP 122/88 mmHg  Pulse 77  Temp(Src) 97.7 F (36.5 C)  Resp 18  SpO2 98% Physical Exam  Constitutional: She is oriented to person, place, and time. She appears well-developed and well-nourished. No distress.  HENT:  Head: Normocephalic and atraumatic.  Mouth/Throat: No oropharyngeal exudate.  Eyes: Pupils are equal, round,  and reactive to light. No scleral icterus.  Neck: Normal range of motion. Neck supple. No JVD present.  Cardiovascular: Normal rate, regular rhythm, normal heart sounds and intact distal pulses.  Exam reveals no gallop and no friction rub.   No murmur heard. Pulmonary/Chest: Effort normal and breath sounds normal. No respiratory distress. She exhibits no tenderness.  Abdominal: Soft. She exhibits no distension. There is no tenderness. There is no rebound.  Musculoskeletal: Normal range of motion. She exhibits no edema or tenderness.  Neurological: She is alert and oriented to person, place, and time. No cranial nerve deficit. She exhibits normal muscle tone. Coordination normal.  Skin: Skin is warm and dry. No rash noted. She is not diaphoretic. No erythema. No pallor.  Psychiatric: She has a normal mood and affect.  Nursing note and vitals reviewed.   ED Course  Procedures (including critical care time) Labs Review Labs Reviewed  BASIC METABOLIC PANEL - Abnormal; Notable for the following:    Creatinine, Ser 1.03 (*)    GFR calc non Af Amer 56 (*)    All other components within normal limits  CBC  I-STAT TROPOININ, ED    Imaging Review Dg Chest 2 View  10/06/2015  CLINICAL DATA:  Chest pain. EXAM: CHEST  2 VIEW COMPARISON:  CT 02/01/2012 . FINDINGS: Mediastinum hilar structures are normal. Lungs are clear of infiltrates. No pleural effusion or pneumothorax. Cardiomegaly with normal pulmonary vascularity. No acute bony abnormality. IMPRESSION: 1. No acute pulmonary disease. 2. Cardiomegaly.  No pulmonary venous congestion. Electronically Signed   By: Maisie Fushomas  Register   On: 10/06/2015 13:17   I have personally reviewed and evaluated these images and lab results as part of my medical decision-making.   EKG Interpretation   Date/Time:  Monday October 06 2015 12:13:55 EST Ventricular Rate:  73 PR Interval:  154 QRS Duration: 140 QT Interval:  426 QTC Calculation: 469 R Axis:    -63 Text Interpretation:  Normal sinus rhythm Left axis deviation Right bundle  branch block Abnormal ECG Confirmed by PICKERING  MD, Harrold DonathNATHAN [phone]([id number]) on  10/06/2015 2:58:15 PM      MDM   Final diagnoses:  None    Pt is a 64yoF with above PMHx here with intermittent CP for 1 week radiating to her back and left shoulder. Pressure sensation worse with exertion. Further history and exam as above. Reassuring VS and exam. cxr with cardiomegaly. ekg with no acute changes and known RBBB. HEART score of 4 with risk factors, age, history. Pain improved with asa and nitro. Concern for ACS at this time. Doubt pe, pna, aortic pathology. Patient to be admitted to medicine for further management and evaluation.    Marijean Niemannyler Paola Aleshire, MD 10/07/15 1235  Benjiman CoreNathan Pickering, MD 10/09/15 [phone]

## 2015-10-06 NOTE — ED Notes (Signed)
Pt remains monitored by blood pressure, pulse ox, and 5 lead.  

## 2015-10-06 NOTE — Telephone Encounter (Signed)
Form signed and in my box. 

## 2015-10-06 NOTE — Telephone Encounter (Signed)
Patient called to find out if her DMV form is ready for pick up.  Please call her back at [phone].

## 2015-10-06 NOTE — H&P (Signed)
[facility] History and Physical  Madison FullingCarrie E Mullane ZOX:[id number] DOB: [birth year] DOA: 10/06/2015  Referring physician: Armond Hangwoodrum PCP: Ruthe Mannanalia Aron, MD   Chief Complaint: chest pain  HPI: Madison Miller is a 65 y.o. female with past medical history that includes, CHF, hypertension, gerd,  Presents to  emergency department with the chief complaint of persistent waxing and waning epigastric chest pain. Heart score 3. Admitting to rule out ACS.  Patient reports 7-10 day history Chest pain described as sharp constant intensity ranges from 4-9. Radiates to the left shoulder and left side of the arm. She reports the pain never goes away. Present at rest and with activity. Nothing that makes it better or worse. She denies palpitations shortness of breath diaphoresis headache syncope or near-syncope. She denies orthopnea. She denies abdominal pain nausea vomiting. She denies any recent dysuria hematuria frequency or urgency. She denies any recent travel or sick contacts. Denies any recent falls or physical injury.  In the emergency department she is given 324 mg of aspirin 0.4 mg of nitroglycerin sublingually. At the time of admission she reports pain is much better but "it never really goes away".   Review of Systems:  10 point review of systems complete and all systems are negative except as indicated in the history of present illness  Past Medical History  Diagnosis Date  . Hyperparathyroidism   . SVT (supraventricular tachycardia) (HCC)   . PAC (premature atrial contraction)   . Degenerative joint disease of cervical and lumbar spine   . Spondylosis   . Degenerative lumbar disc   . Radiculopathy   . Cardiac LV ejection fraction >40%   . GERD (gastroesophageal reflux disease)   . Chest pain   . Right bundle branch block and left anterior fascicular block   . Allergic rhinitis due to pollen   . Migraine, unspecified, with intractable migraine, so stated, without mention of  status migrainosus   . Nephrolithiasis   . Gallstones   . CHF (congestive heart failure) (HCC)   . HTN (hypertension)    Past Surgical History  Procedure Laterality Date  . Parathyroidectomy  2009  . Lumbar laminectomy  2010    Dr. Phoebe PerchHirsch   Social History:  reports that she has never smoked. She has never used smokeless tobacco. She reports that she does not drink alcohol or use illicit drugs. Patient lives alone she is independent with ADLs she is on disability  Allergies  Allergen Reactions  . Lactose Intolerance (Gi) Other (See Comments)    Pain,bloating    Family History  Problem Relation Age of Onset  . Other Mother     barin tumor  . Stroke Father   . Colon cancer Neg Hx   . Rectal cancer Neg Hx   . Stomach cancer Neg Hx      Prior to Admission medications   Medication Sig Start Date End Date Taking? Authorizing Provider  acetaminophen (TYLENOL) 500 MG tablet Take 500 mg by mouth 2 (two) times daily as needed for headache.   Yes Historical Provider, MD  butalbital-acetaminophen-caffeine (FIORICET, ESGIC) 50-325-40 MG per tablet TAKE ONE TABLET BY MOUTH TWICE DAILY AS NEEDED FOR HEADACHE 10/12/13  Yes Dianne Dunalia M Aron, MD  furosemide (LASIX) 40 MG tablet TAKE 1 TABLET BY MOUTH DAILY 09/24/15  Yes Dianne Dunalia M Aron, MD  ibuprofen (ADVIL,MOTRIN) 200 MG tablet Take 400 mg by mouth 2 (two) times daily as needed for fever or moderate pain.   Yes Historical Provider, MD  lisinopril (  PRINIVIL,ZESTRIL) 10 MG tablet TAKE 1 TABLET BY MOUTH DAILY 02/27/15  Yes Dianne Dun, MD  metoprolol succinate (TOPROL-XL) 25 MG 24 hr tablet TAKE 1/2 TABLET BY MOUTH EVERY DAY. 07/10/15  Yes Dianne Dun, MD  naproxen sodium (ANAPROX) 220 MG tablet Take 440 mg by mouth 2 (two) times daily as needed (for pain).   Yes Historical Provider, MD  cholecalciferol (VITAMIN D) 1000 UNITS tablet Take 1,000 Units by mouth daily as needed (for supplementation).     Historical Provider, MD  potassium chloride  (K-DUR,KLOR-CON) 10 MEQ tablet Take 0.5 tablets (5 mEq total) by mouth daily. Patient not taking: Reported on 10/06/2015 04/23/15   Dianne Dun, MD   Physical Exam: Filed Vitals:   10/06/15 1218 10/06/15 1555  BP: 122/88 103/73  Pulse: 77 75  Temp: 97.7 F (36.5 C)   Resp: 18 20  SpO2: 98% 100%    Wt Readings from Last 3 Encounters:  03/13/15 61.009 kg (134 lb 8 oz)  03/04/15 60.328 kg (133 lb)  02/18/15 60.836 kg (134 lb 1.9 oz)    General:  Appears calm and comfortable cooperative Eyes: PERRL, normal lids, irises & conjunctiva ENT: grossly normal hearing, mucous membranes of her mouth are moist and pink  Neck: no LAD, masses or thyromegaly Cardiovascular: RRR, no m/r/g. No LE edema. pedal pulses present and palpable. Mild tenderness to left anterior chest with palpation  Telemetry: SR, no arrhythmias  Respiratory: CTA bilaterally, no w/r/r. Normal respiratory effort. Abdomen: soft, ntnd positive bowel sounds no rebounding Skin: no rash or induration seen on limited exam Musculoskeletal: grossly normal tone BUE/BLE, joints without swelling/erythema full range of motion  Psychiatric: grossly normal mood and affect, speech fluent and appropriate Neurologic: grossly non-focal.          Labs on Admission:  Basic Metabolic Panel:  Recent Labs Lab 10/06/15 1222  NA 143  K 3.8  CL 104  CO2 30  GLUCOSE 99  BUN 19  CREATININE 1.03*  CALCIUM 9.6   Liver Function Tests: No results for input(s): AST, ALT, ALKPHOS, BILITOT, PROT, ALBUMIN in the last 168 hours. No results for input(s): LIPASE, AMYLASE in the last 168 hours. No results for input(s): AMMONIA in the last 168 hours. CBC:  Recent Labs Lab 10/06/15 1222  WBC 5.6  HGB 12.8  HCT 39.4  MCV 92.3  PLT 229   Cardiac Enzymes: No results for input(s): CKTOTAL, CKMB, CKMBINDEX, TROPONINI in the last 168 hours.  BNP (last 3 results) No results for input(s): BNP in the last 8760 hours.  ProBNP (last 3  results) No results for input(s): PROBNP in the last 8760 hours.  CBG: No results for input(s): GLUCAP in the last 168 hours.  Radiological Exams on Admission: Dg Chest 2 View  10/06/2015  CLINICAL DATA:  Chest pain. EXAM: CHEST  2 VIEW COMPARISON:  CT 02/01/2012 . FINDINGS: Mediastinum hilar structures are normal. Lungs are clear of infiltrates. No pleural effusion or pneumothorax. Cardiomegaly with normal pulmonary vascularity. No acute bony abnormality. IMPRESSION: 1. No acute pulmonary disease. 2. Cardiomegaly.  No pulmonary venous congestion. Electronically Signed   By: Maisie Fus  Register   On: 10/06/2015 13:17    EKG: Independently reviewed Normal sinus rhythm Left axis deviation Right bundle branch block Abnormal ECG.   Assessment/Plan Principal Problem:   Chest pain Active Problems:   Lumbar pain with radiation down both legs   HTN (hypertension)   GERD (gastroesophageal reflux disease)   #1. Chest pain.  Some typical and atypical features. Heart score 3. Review indicates history of chronic chest pain. Initial troponin negative. EKG was no acute changes. Chest xray unremarkable. -Admit to telemetry to rule out -Cycle troponin -Serial EKG -lipid panel -GI cocktail -Nitroglycerin as needed -Oxygen supplementation as indicated -continue aspirin -consider statin -may benefit OP stress test  #2. Hypertension. Controlled while in the emergency department. Home medications include Lasix, lisinopril, Toprol. -We'll continue home medications -monitor closely  #3. Lumbar pain with radiation down both legs/chronic back pain. Appears stable at baseline.  -continue home meds -Physical therapy  #4. GERD. Stable at baseline. Home medications likely contribute. Not currently on PPI.  -gi cocktail -consider PPI at discharge    Code Status: full DVT Prophylaxis: Family Communication: none present Disposition Plan: home when rady  Time spent: 45 minutes  [facility]

## 2015-10-06 NOTE — ED Notes (Signed)
Pt here for intermittent chest pain x 1 week and worsening. sts central chest radiating to the left side and arm. Denies SOB

## 2015-10-06 NOTE — Telephone Encounter (Signed)
I notified patient form is ready for pick up. °

## 2015-10-07 DIAGNOSIS — R0789 Other chest pain: Secondary | ICD-10-CM | POA: Insufficient documentation

## 2015-10-07 DIAGNOSIS — I10 Essential (primary) hypertension: Secondary | ICD-10-CM | POA: Diagnosis not present

## 2015-10-07 DIAGNOSIS — M545 Low back pain: Secondary | ICD-10-CM

## 2015-10-07 DIAGNOSIS — R079 Chest pain, unspecified: Secondary | ICD-10-CM | POA: Diagnosis not present

## 2015-10-07 DIAGNOSIS — K219 Gastro-esophageal reflux disease without esophagitis: Secondary | ICD-10-CM | POA: Insufficient documentation

## 2015-10-07 LAB — URINALYSIS, ROUTINE W REFLEX MICROSCOPIC
Bilirubin Urine: NEGATIVE
Glucose, UA: NEGATIVE mg/dL
Ketones, ur: NEGATIVE mg/dL
Leukocytes, UA: NEGATIVE
Nitrite: NEGATIVE
Protein, ur: NEGATIVE mg/dL
Specific Gravity, Urine: 1.018 (ref 1.005–1.030)
pH: 6 (ref 5.0–8.0)

## 2015-10-07 LAB — URINE MICROSCOPIC-ADD ON

## 2015-10-07 LAB — TROPONIN I
Troponin I: <0.03 ng/mL (ref <0.031)
Troponin I: <0.03 ng/mL (ref <0.031)

## 2015-10-07 MED ORDER — KETOROLAC TROMETHAMINE 10 MG PO TABS: 20.0000 mg | ORAL_TABLET | Freq: Two times a day (BID) | ORAL | Status: AC

## 2015-10-07 MED ORDER — PANTOPRAZOLE SODIUM 40 MG PO TBEC: 40.0000 mg | DELAYED_RELEASE_TABLET | Freq: Two times a day (BID) | ORAL | Status: DC

## 2015-10-07 NOTE — Consult Note (Signed)
CARDIOLOGY CONSULT NOTE   Patient ID: Madison Miller MRN: [id number] DOB/AGE: [birth year] 65 y.o.  Admit date: 10/06/2015  Primary Physician   [name], MD Primary Cardiologist   Dr. Shirlee Latch  Reason for Consultation   Chest pain Requesting Physician Dr. Gwenlyn Perking  HPI: Madison Miller is a 65 y.o. female with a history of SVT, PAC, hyperparathyroidism s/p parathyroidectomy in March 2009, chronic right bundle branch block, GERD,  chronic lower back pain and atypical chest pain and came to [facility] 10/06/15 for evaluation of chest pain x one week.   -History of SVT in the setting of hypercalcemia. Holter monitor done in January 2010, showed occasional PAC. There were no runs of SVT. -Cardiac MRI in March 2009 to evaluate for source of SVT --> no evidence for infiltrative disease. EF was 58%.  Last seen by Dr. Shirlee Latch 01/21/2012. No coronary artery calcium on CT cardiac scoring 02/01/2012. Normal Echo 10/2012.   The patient presented with one-week history of intermittent chest pain. She described the chest pain as a sharp/tightness at mid upper chest. Occurs with or without exertion which lasts for minutes to hours and resolved by itself. She also has a separate lower back pain. Denies radiation of pain. Admits to having intermittent shortness of breath, not related to exertion. She also having cough for the past 4-5 days. Movement and coughing exacerbate symptoms.  Patient denies palpitation, orthopnea, PND, syncope, lower extremity edema, melena, blood in her stool, nausea or vomiting.   POC trop x 2 negative. Troponin x 2 negative. EKG showed NSR with RBBB, which appears chronic. Never tobacco smoker. TSH normal. No recent travel.    Past Medical History  Diagnosis Date  . Hyperparathyroidism   . SVT (supraventricular tachycardia) (HCC)   . PAC (premature atrial contraction)   . Degenerative joint disease of cervical and lumbar spine   . Spondylosis   . Degenerative  lumbar disc   . Radiculopathy   . Cardiac LV ejection fraction >40%   . GERD (gastroesophageal reflux disease)   . Chest pain   . Right bundle branch block and left anterior fascicular block   . Allergic rhinitis due to pollen   . Migraine, unspecified, with intractable migraine, so stated, without mention of status migrainosus   . Nephrolithiasis   . Gallstones   . CHF (congestive heart failure) (HCC)   . HTN (hypertension)      Past Surgical History  Procedure Laterality Date  . Parathyroidectomy  2009  . Lumbar laminectomy  2010    Dr. Phoebe Perch    Allergies  Allergen Reactions  . Lactose Intolerance (Gi) Other (See Comments)    Pain,bloating    I have reviewed the patient's current medications . enoxaparin (LOVENOX) injection  40 mg Subcutaneous Q24H  . furosemide  40 mg Oral Daily  . lisinopril  10 mg Oral Daily  . metoprolol succinate  25 mg Oral Daily     acetaminophen, gi cocktail, ibuprofen, naproxen, nitroGLYCERIN, ondansetron (ZOFRAN) IV  Prior to Admission medications   Medication Sig Start Date End Date Taking? Authorizing Provider  acetaminophen (TYLENOL) 500 MG tablet Take 500 mg by mouth 2 (two) times daily as needed for headache.   Yes Historical Provider, MD  butalbital-acetaminophen-caffeine (FIORICET, ESGIC) 50-325-40 MG per tablet TAKE ONE TABLET BY MOUTH TWICE DAILY AS NEEDED FOR HEADACHE 10/12/13  Yes Dianne Dun, MD  furosemide (LASIX) 40 MG tablet TAKE 1 TABLET BY MOUTH DAILY 09/24/15  Yes Bryn Gulling  Dayton Martes, MD  ibuprofen (ADVIL,MOTRIN) 200 MG tablet Take 400 mg by mouth 2 (two) times daily as needed for fever or moderate pain.   Yes Historical Provider, MD  lisinopril (PRINIVIL,ZESTRIL) 10 MG tablet TAKE 1 TABLET BY MOUTH DAILY 02/27/15  Yes Dianne Dun, MD  metoprolol succinate (TOPROL-XL) 25 MG 24 hr tablet TAKE 1/2 TABLET BY MOUTH EVERY DAY. 07/10/15  Yes Dianne Dun, MD  naproxen sodium (ANAPROX) 220 MG tablet Take 440 mg by mouth 2 (two) times daily as  needed (for pain).   Yes Historical Provider, MD  cholecalciferol (VITAMIN D) 1000 UNITS tablet Take 1,000 Units by mouth daily as needed (for supplementation).     Historical Provider, MD  potassium chloride (K-DUR,KLOR-CON) 10 MEQ tablet Take 0.5 tablets (5 mEq total) by mouth daily. Patient not taking: Reported on 10/06/2015 04/23/15   Dianne Dun, MD     Social History   Social History  . Marital Status: Married    Spouse Name: N/A  . Number of Children: 6  . Years of Education: N/A   Occupational History  . Not on file.   Social History Main Topics  . Smoking status: Never Smoker   . Smokeless tobacco: Never Used  . Alcohol Use: No  . Drug Use: No  . Sexual Activity: Not on file   Other Topics Concern  . Not on file   Social History Narrative    Family Status  Relation Status Death Age  . Mother Alive   . Father Alive     cva or stroke   Family History  Problem Relation Age of Onset  . Other Mother     barin tumor  . Stroke Father   . Colon cancer Neg Hx   . Rectal cancer Neg Hx   . Stomach cancer Neg Hx       ROS:  Full 14 point review of systems complete and found to be negative unless listed above.  Physical Exam: Blood pressure 95/62, pulse 61, temperature 98.3 F (36.8 C), temperature source Oral, resp. rate 16, height  (1.626 m), weight 125 lb 11.2 oz (57.017 kg), SpO2 100 %.  General: Well developed, well nourished, female in no acute distress Head: Eyes PERRLA, No xanthomas. Normocephalic and atraumatic, oropharynx without edema or exudate.  Lungs: Resp regular and unlabored, CTA. TTP over mid upper chest.  Heart: RRR no s3, s4, or murmurs..   Neck: No carotid bruits. No lymphadenopathy. No JVD. Abdomen: Bowel sounds present, abdomen soft and non-tender without masses or hernias noted. Msk:  No spine or cva tenderness. No weakness, no joint deformities or effusions. Extremities: No clubbing, cyanosis or edema. DP/PT/Radials 2+ and equal  bilaterally. Neuro: Alert and oriented X 3. No focal deficits noted. Psych:  Good affect, responds appropriately Skin: No rashes or lesions noted.  Labs:   Lab Results  Component Value Date   WBC 5.6 10/06/2015   HGB 12.8 10/06/2015   HCT 39.4 10/06/2015   MCV 92.3 10/06/2015   PLT 229 10/06/2015   No results for input(s): INR in the last 72 hours.  Recent Labs Lab 10/06/15 1222  NA 143  K 3.8  CL 104  CO2 30  BUN 19  CREATININE 1.03*  CALCIUM 9.6  GLUCOSE 99   MAGNESIUM  Date Value Ref Range Status  11/27/2007 2.1  Final    Recent Labs  10/06/15 2016 10/07/15 0150  TROPONINI <0.03 <0.03    Recent Labs  10/06/15  1231 10/06/15 1614  TROPIPOC 0.01 0.01   Lab Results  Component Value Date   CHOL 158 10/06/2015   HDL 47 10/06/2015   LDLCALC 95 10/06/2015   TRIG 79 10/06/2015   LIPASE  Date/Time Value Ref Range Status  03/18/2014 11:56 AM 39.0 11.0 - 59.0 U/L Final    No results found for: VITAMINB12, FOLATE, FERRITIN, TIBC, IRON, RETICCTPCT  Echo: 11/08/2012 - Normal EF (56%)  with mild MR.   ECG:  NSR with chronic RBBB  Radiology:  Dg Chest 2 View  10/06/2015  CLINICAL DATA:  Chest pain. EXAM: CHEST  2 VIEW COMPARISON:  CT 02/01/2012 . FINDINGS: Mediastinum hilar structures are normal. Lungs are clear of infiltrates. No pleural effusion or pneumothorax. Cardiomegaly with normal pulmonary vascularity. No acute bony abnormality. IMPRESSION: 1. No acute pulmonary disease. 2. Cardiomegaly.  No pulmonary venous congestion. Electronically Signed   By: Maisie Fus  Register   On: 10/06/2015 13:17    CT HEART WITH CALCIUM SCORING 02/01/2012  Comparison: Chest radiograph 07/18/2009.  Findings: There is no coronary artery atherosclerosis. There is a tiny fleck of atherosclerotic calcification superior to the origin of the right coronary artery in the ascending aorta (image 20 series 2). Three-dimensional single representative image was sent. Left sided  coronary arteries are normal.  The lung windows show dependent atelectasis. No airspace disease or effusion. Bones demonstrate scattered Schmorl's nodes. No aggressive osseous lesions.  IMPRESSION: No coronary artery calcium.  ASSESSMENT AND PLAN:     1. Chest pain - Atypical in nature. Seems pleuritic.  Worsened with movement and cough. Reproducible with palpation. She never got sublingual nitroglycerin or GI cocktail in ED per patient. - -Cardiac MRI in March 2009 to evaluate for source of SVT --> no evidence for infiltrative disease. EF was 58%.  No coronary artery calcium on CT cardiac scoring 02/01/2012. Normal Echo 10/2012.  Given previous reassuring cardiac evaluation, non ischemic EKG , negative troponin and atypical chest pain no inpatient ischemic workup needed. We will schedule outpatient appointment with Dr. Shirlee Latch and possible stress test.  - Non cardiac workup per primary.   2. HTN - Stable and well controlled. Continue home medications.   3. GERD (gastroesophageal reflux disease)   Signed: Zamyah Wiesman, PA 10/07/2015, 8:04 AM   Co-Sign MD

## 2015-10-07 NOTE — Progress Notes (Signed)
PT Cancellation Note  Patient Details Name: Madison Miller MRN: [id number] DOB: Feb 27, 1951   Cancelled Treatment:    Reason Eval/Treat Not Completed: PT screened, no needs identified, will sign off (pt reports independent mobility/at her baseline currently and  to D/C today)   [facility] 10/07/2015, 1:28 PM

## 2015-10-07 NOTE — Care Management Obs Status (Signed)
MEDICARE OBSERVATION STATUS NOTIFICATION   Patient Details  Name: Madison Miller MRN: [id number] Date of Birth: 07/12/1951   Medicare Observation Status Notification Given:  Yes    Gala Lewandowsky, RN 10/07/2015, 10:28 AM

## 2015-10-07 NOTE — Discharge Summary (Signed)
Physician Discharge Summary  Madison Miller:[id number] DOB: 03-Sep-1951 DOA: 10/06/2015  PCP: Madison Mannan, MD  Admit date: 10/06/2015 Discharge date: 10/07/2015  Time spent: 35 minutes  Recommendations for Outpatient Follow-up:  1. Follow resolution of pain 2. Resume/adjust analgesic regimen for her lumbar sciatica    Discharge Diagnoses:  Principal Problem:   Chest pain Active Problems:   Lumbar pain with radiation down both legs   HTN (hypertension)   GERD (gastroesophageal reflux disease)   Chest wall pain   Discharge Condition: stable and improved. Discharge home with instructions to follow up with PCP in 10 days. Patient will also follow up with cardiology service; their office will contact her with appointment details.  Diet recommendation: heart healthy diet   Filed Weights   10/06/15 1725 10/07/15 0500  Weight: 57.335 kg (126 lb 6.4 oz) 57.017 kg (125 lb 11.2 oz)    History of present illness:  65 y.o. female with past medical history that includes, CHF, hypertension, gerd, Presents to emergency department with the chief complaint of persistent waxing and waning epigastric chest pain. Heart score 3. Admitted to rule out ACS.  Patient reports 7-10 day history Chest pain, described as sharp, constant, intensity ranges from 4-9. Radiates to the left shoulder and left side of the arm. She reports the pain never goes away. Present at rest and with activity. Nothing makes it better or worse. She denies palpitations, shortness of breath diaphoresis, headache, syncope or near-syncope. She denies orthopnea. She denies abdominal pain, nausea, vomiting. Also denies any recent dysuria, hematuria, frequency or urgency. She denies any recent travel or sick contacts. Denies any recent falls or physical injury.  Hospital Course:  1-chest Pain: non-cardiac -neg troponin X 3 -no EKG or telemetry changes -reproducible with palpations  -patient recently has been using a lot of  naproxen and ibuprofen -pain most likely MSK or GERD in origin -will d/c ibuprofen and naproxen; will treat with toradol for 7 days and will also start therapy with PPI. -given risk factor, cardiology service will follow her up as an outpatient for potential Stress Test  2-essential HTN -well controlled -will continue current antihypertensive regimen -patient advise to follow heart healthy diet  3-GERD: started on PPI  4-hx of migraines: will continue PRN Fioricet   5-RBBB: stable and unchanged   6-lumbar pain with sciatica  -stable -continue outpatient work up and treatment   Procedures:  See below for x-ray reports   Consultations:  Cardiology   Discharge Exam: Filed Vitals:   10/07/15 0500 10/07/15 0952  BP: 95/62 90/53  Pulse: 61   Temp: 98.3 F (36.8 C)   Resp:      General: afebrile, No SOB, no nausea or vomiting. Still with some mild chest wall pain, worse/exacerbated with palpation. Cardiovascular: S1 and S2, no rubs or gallops, no murmur  Respiratory: CTA bilaterally, good air movement Abd: soft, NT, ND, positive BS   Discharge Instructions   Discharge Instructions    Discharge instructions    Complete by:  As directed   Take medications as prescribed Please follow a heart healthy diet Arrange follow up with PCP in 10 days Follow up with cardiologist (Dr. Shirlee Latch); office will contact you with appointment details.          Current Discharge Medication List    START taking these medications   Details  ketorolac (TORADOL) 10 MG tablet Take 2 tablets (20 mg total) by mouth 2 (two) times daily. Qty: 28 tablet, Refills: 0  pantoprazole (PROTONIX) 40 MG tablet Take 1 tablet (40 mg total) by mouth 2 (two) times daily. Qty: 60 tablet, Refills: 1      CONTINUE these medications which have NOT CHANGED   Details  acetaminophen (TYLENOL) 500 MG tablet Take 500 mg by mouth 2 (two) times daily as needed for headache.     butalbital-acetaminophen-caffeine (FIORICET, ESGIC) 50-325-40 MG per tablet TAKE ONE TABLET BY MOUTH TWICE DAILY AS NEEDED FOR HEADACHE Qty: 60 tablet, Refills: 0    furosemide (LASIX) 40 MG tablet TAKE 1 TABLET BY MOUTH DAILY Qty: 30 tablet, Refills: 0    lisinopril (PRINIVIL,ZESTRIL) 10 MG tablet TAKE 1 TABLET BY MOUTH DAILY Qty: 30 tablet, Refills: 11    metoprolol succinate (TOPROL-XL) 25 MG 24 hr tablet TAKE 1/2 TABLET BY MOUTH EVERY DAY. Qty: 15 tablet, Refills: 6    cholecalciferol (VITAMIN D) 1000 UNITS tablet Take 1,000 Units by mouth daily as needed (for supplementation).     potassium chloride (K-DUR,KLOR-CON) 10 MEQ tablet Take 0.5 tablets (5 mEq total) by mouth daily. Qty: 30 tablet, Refills: 0      STOP taking these medications     ibuprofen (ADVIL,MOTRIN) 200 MG tablet      naproxen sodium (ANAPROX) 220 MG tablet        Allergies  Allergen Reactions  . Lactose Intolerance (Gi) Other (See Comments)    Pain,bloating   Follow-up Information    Follow up with Madison Mannan, MD. Go on 10/13/2015.   Specialty:  Family Medicine   Why:  Follow up @ 11am   Contact information:   [address] [place] [address] [phone]       The results of significant diagnostics from this hospitalization (including imaging, microbiology, ancillary and laboratory) are listed below for reference.    Significant Diagnostic Studies: Dg Chest 2 View  10/06/2015  CLINICAL DATA:  Chest pain. EXAM: CHEST  2 VIEW COMPARISON:  CT 02/01/2012 . FINDINGS: Mediastinum hilar structures are normal. Lungs are clear of infiltrates. No pleural effusion or pneumothorax. Cardiomegaly with normal pulmonary vascularity. No acute bony abnormality. IMPRESSION: 1. No acute pulmonary disease. 2. Cardiomegaly.  No pulmonary venous congestion. Electronically Signed   By: Maisie Fus  Register   On: 10/06/2015 13:17   Labs: Basic Metabolic Panel:  Recent Labs Lab 10/06/15 1222  NA 143  K 3.8   CL 104  CO2 30  GLUCOSE 99  BUN 19  CREATININE 1.03*  CALCIUM 9.6   CBC:  Recent Labs Lab 10/06/15 1222  WBC 5.6  HGB 12.8  HCT 39.4  MCV 92.3  PLT 229   Cardiac Enzymes:  Recent Labs Lab 10/06/15 2016 10/07/15 0150 10/07/15 0807  TROPONINI <0.03 <0.03 <0.03    Signed:  Vassie Loll MD.  [facility] 10/07/2015, 10:32 AM

## 2015-10-09 ENCOUNTER — Telehealth: Payer: Self-pay | Admitting: *Deleted

## 2015-10-09 NOTE — Telephone Encounter (Signed)
Transition Care Management Follow-up Telephone Call   Date discharged? 10/07/15   How have you been since you were released from the hospital? Doing well   Do you understand why you were in the hospital? yes   Do you understand the discharge instructions? yes   Where were you discharged to? home   Items Reviewed:  Medications reviewed: yes  Allergies reviewed: yes  Dietary changes reviewed: no  Referrals reviewed: yes, cardiology   Functional Questionnaire:   Activities of Daily Living (ADLs):   She states they are independent in the following: ambulation, bathing and hygiene, feeding, continence, grooming, toileting and dressing States they require assistance with the following: none   Any transportation issues/concerns?: no   Any patient concerns? no   Confirmed importance and date/time of follow-up visits scheduled yes, 10/13/15 @ 1100  Provider Appointment booked with [name], MD  Confirmed with patient if condition begins to worsen call PCP or go to the ER.  Patient was given the office number and encouraged to call back with question or concerns.  : yes

## 2015-10-13 ENCOUNTER — Ambulatory Visit: Payer: Medicare Other | Admitting: Family Medicine

## 2015-10-14 ENCOUNTER — Telehealth: Payer: Self-pay | Admitting: Family Medicine

## 2015-10-14 NOTE — Telephone Encounter (Signed)
Left message for pt to call back and reschedule appt

## 2015-10-14 NOTE — Telephone Encounter (Signed)
Pt did not come in for their appt on 10/13/15 for an office visit. Please let me know if pt needs to be contacted immediately for follow up or no follow up needed. Best phone number to contact pt is [phone].

## 2015-10-14 NOTE — Telephone Encounter (Signed)
Yes please contact pt to reschedule.

## 2015-10-17 ENCOUNTER — Ambulatory Visit: Payer: Medicare Other | Admitting: Physician Assistant

## 2015-10-22 ENCOUNTER — Other Ambulatory Visit: Payer: Self-pay | Admitting: Family Medicine

## 2015-10-29 NOTE — Telephone Encounter (Signed)
Pt aware, appt scheduled for 11/10/15 °

## 2015-10-29 NOTE — Telephone Encounter (Signed)
Below message about appt is in error. Disregard.

## 2015-12-10 ENCOUNTER — Ambulatory Visit (INDEPENDENT_AMBULATORY_CARE_PROVIDER_SITE_OTHER): Payer: Medicare Other

## 2015-12-10 VITALS — BP 100/70 | HR 67 | Temp 98.5°F | Ht 64.0 in | Wt 134.0 lb

## 2015-12-10 DIAGNOSIS — Z0000 Encounter for general adult medical examination without abnormal findings: Secondary | ICD-10-CM

## 2015-12-10 NOTE — Progress Notes (Signed)
I reviewed health advisor's note, was available for consultation, and agree with documentation and plan.  

## 2015-12-10 NOTE — Patient Instructions (Signed)
[name] , Thank you for taking time to come for your Medicare Wellness Visit. I appreciate your ongoing commitment to your health goals. Please review the following plan we discussed and let me know if I can assist you in the future.   These are the goals we discussed: Goals    . Increase physical activity     Starting 12/10/2015, I will ride my stationary bike for at least 30 min 2 days per week.       This is a list of the screening recommended for you and due dates:  Health Maintenance  Topic Date Due  . Tetanus Vaccine  11/18/2016*  . Flu Shot  12/18/2016*  . Mammogram  11/19/2023*  . Pap Smear  11/19/2023*  . Shingles Vaccine  11/19/2023*  .  Hepatitis C: One time screening is recommended by Center for Disease Control  (CDC) for  adults born from [birth year] through 1965.   11/19/2023*  . HIV Screening  11/19/2023*  . Colon Cancer Screening  05/05/2020  *Topic was postponed. The date shown is not the original due date.   Preventive Care for Adults  A healthy lifestyle and preventive care can promote health and wellness. Preventive health guidelines for adults include the following key practices.  . A routine yearly physical is a good way to check with your health care provider about your health and preventive screening. It is a chance to share any concerns and updates on your health and to receive a thorough exam.  . Visit your dentist for a routine exam and preventive care every 6 months. Brush your teeth twice a day and floss once a day. Good oral hygiene prevents tooth decay and gum disease.  . The frequency of eye exams is based on your age, health, family medical history, use  of contact lenses, and other factors. Follow your health care provider's ecommendations for frequency of eye exams.  . Eat a healthy diet. Foods like vegetables, fruits, whole grains, low-fat dairy products, and lean protein foods contain the nutrients you need without too many calories. Decrease  your intake of foods high in solid fats, added sugars, and salt. Eat the right amount of calories for you. Get information about a proper diet from your health care provider, if necessary.  . Regular physical exercise is one of the most important things you can do for your health. Most adults should get at least 150 minutes of moderate-intensity exercise (any activity that increases your heart rate and causes you to sweat) each week. In addition, most adults need muscle-strengthening exercises on 2 or more days a week.  Silver Sneakers may be a benefit available to you. To determine eligibility, you may visit the website: www.silversneakers.com or contact program at [phone] Mon-Fri between 8AM-8PM.   . Maintain a healthy weight. The body mass index (BMI) is a screening tool to identify possible weight problems. It provides an estimate of body fat based on height and weight. Your health care provider can find your BMI and can help you achieve or maintain a healthy weight.   For adults 20 years and older: ? A BMI below 18.5 is considered underweight. ? A BMI of 18.5 to 24.9 is normal. ? A BMI of 25 to 29.9 is considered overweight. ? A BMI of 30 and above is considered obese.   . Maintain normal blood lipids and cholesterol levels by exercising and minimizing your intake of saturated fat. Eat a balanced diet with plenty of fruit and  vegetables. Blood tests for lipids and cholesterol should begin at age 3 and be repeated every 5 years. If your lipid or cholesterol levels are high, you are over 50, or you are at high risk for heart disease, you may need your cholesterol levels checked more frequently. Ongoing high lipid and cholesterol levels should be treated with medicines if diet and exercise are not working.  . If you smoke, find out from your health care provider how to quit. If you do not use tobacco, please do not start.  . If you choose to drink alcohol, please do not consume more than  2 drinks per day. One drink is considered to be 12 ounces (355 mL) of beer, 5 ounces (148 mL) of wine, or 1.5 ounces (44 mL) of liquor.  . If you are 43-58 years old, ask your health care provider if you should take aspirin to prevent strokes.  . Use sunscreen. Apply sunscreen liberally and repeatedly throughout the day. You should seek shade when your shadow is shorter than you. Protect yourself by wearing long sleeves, pants, a wide-brimmed hat, and sunglasses year round, whenever you are outdoors.  . Once a month, do a whole body skin exam, using a mirror to look at the skin on your back. Tell your health care provider of new moles, moles that have irregular borders, moles that are larger than a pencil eraser, or moles that have changed in shape or color.

## 2015-12-10 NOTE — Progress Notes (Signed)
Subjective:   Madison Miller is a 65 y.o. female who presents for Medicare Annual (Subsequent) preventive examination.   Cardiac Risk Factors include: hypertension     Objective:     Vitals: BP 100/70 mmHg  Pulse 67  Temp(Src) 98.5 F (36.9 C) (Oral)  Ht 5\' 4"  (1.626 m)  Wt 134 lb (60.782 kg)  BMI 22.99 kg/m2  SpO2 98%  Body mass index is 22.99 kg/(m^2).   Tobacco History  Smoking status  . Never Smoker   Smokeless tobacco  . Never Used     Counseling given: No   Past Medical History  Diagnosis Date  . Hyperparathyroidism   . SVT (supraventricular tachycardia) (HCC)   . PAC (premature atrial contraction)   . Degenerative joint disease of cervical and lumbar spine   . Spondylosis   . Degenerative lumbar disc   . Radiculopathy   . Cardiac LV ejection fraction >40%   . GERD (gastroesophageal reflux disease)   . Chest pain   . Right bundle branch block and left anterior fascicular block   . Allergic rhinitis due to pollen   . Migraine, unspecified, with intractable migraine, so stated, without mention of status migrainosus   . Nephrolithiasis   . Gallstones   . CHF (congestive heart failure) (HCC)   . HTN (hypertension)    Past Surgical History  Procedure Laterality Date  . Parathyroidectomy  2009  . Lumbar laminectomy  2010    Dr. Phoebe PerchHirsch   Family History  Problem Relation Age of Onset  . Other Mother     barin tumor  . Stroke Father   . Colon cancer Neg Hx   . Rectal cancer Neg Hx   . Stomach cancer Neg Hx    History  Sexual Activity  . Sexual Activity: No    Outpatient Encounter Prescriptions as of 12/10/2015  Medication Sig  . acetaminophen (TYLENOL) 500 MG tablet Take 500 mg by mouth 2 (two) times daily as needed for headache.  . butalbital-acetaminophen-caffeine (FIORICET, ESGIC) 50-325-40 MG per tablet TAKE ONE TABLET BY MOUTH TWICE DAILY AS NEEDED FOR HEADACHE  . cholecalciferol (VITAMIN D) 1000 UNITS tablet Take 1,000 Units by  mouth daily as needed (for supplementation).   . furosemide (LASIX) 40 MG tablet TAKE 1 TABLET BY MOUTH DAILY  . lisinopril (PRINIVIL,ZESTRIL) 10 MG tablet TAKE 1 TABLET BY MOUTH DAILY  . metoprolol succinate (TOPROL-XL) 25 MG 24 hr tablet TAKE 1/2 TABLET BY MOUTH EVERY DAY.  . pantoprazole (PROTONIX) 40 MG tablet Take 1 tablet (40 mg total) by mouth 2 (two) times daily.  . [DISCONTINUED] potassium chloride (K-DUR,KLOR-CON) 10 MEQ tablet Take 0.5 tablets (5 mEq total) by mouth daily. (Patient not taking: Reported on 10/06/2015)   No facility-administered encounter medications on file as of 12/10/2015.    Activities of Daily Living In your present state of health, do you have any difficulty performing the following activities: 12/10/2015 10/06/2015  Hearing? N N  Vision? N N  Difficulty concentrating or making decisions? N N  Walking or climbing stairs? N N  Dressing or bathing? N N  Doing errands, shopping? N N  Preparing Food and eating ? N -  Using the Toilet? N -  In the past six months, have you accidently leaked urine? N -  Do you have problems with loss of bowel control? N -  Managing your Medications? N -  Managing your Finances? N -  Housekeeping or managing your Housekeeping? N -  Patient Care Team: Dianne Dun, MD as PCP - General (Family Medicine)    Assessment:     Hearing Screening           Right ear:   40 40 40 40   Left ear:   40 40 40 40     Visual Acuity Screening   Right eye Left eye Both eyes  Without correction:     With correction: 20/30-1 20/30-1 20/30-1    Exercise Activities and Dietary recommendations Current Exercise Habits: Home exercise routine, Type of exercise: Other - see comments (stationary bike), Time (Minutes): 30, Frequency (Times/Week): 2, Weekly Exercise (Minutes/Week): 60, Intensity: Moderate  Goals    . Increase physical activity     Starting 12/10/2015, I will ride my stationary bike for at  least 30 min 2 days per week.      Fall Risk Fall Risk  12/10/2015  Falls in the past year? No   Depression Screen PHQ 2/9 Scores 12/10/2015  PHQ - 2 Score 0     Cognitive Testing MMSE - Mini Mental State Exam 12/10/2015  Orientation to time 5/5  Orientation to Place 5/5  Registration 3/3  Attention/ Calculation 0  Recall 3/3  Language- name 2 objects 0  Language- repeat 1/1  Language- follow 3 step command 3/3  Language- read & follow direction 0  Write a sentence 0  Copy design 0  Total score 20/20   PLEASE NOTE: A Mini-Cog Screen was completed. A value of 0 denotes a question that was not asked during encounter.    There is no immunization history on file for this patient. Screening Tests Health Maintenance  Topic Date Due  . TETANUS/TDAP  11/18/2016 (Originally 09/15/1970)  . INFLUENZA VACCINE  12/18/2016 (Originally 04/21/2015)  . MAMMOGRAM  11/19/2023 (Originally 09/15/2001)  . PAP SMEAR  11/19/2023 (Originally 09/15/1972)  . ZOSTAVAX  11/19/2023 (Originally 09/16/2011)  . Hepatitis C Screening  11/19/2023 (Originally [birth year])  . HIV Screening  11/19/2023 (Originally 09/15/1966)  . COLONOSCOPY  05/05/2020      Plan:      I have personally reviewed the Medicare Annual Wellness questionnaire and have noted the following in the patient's chart:  A. Medical and social history B. Use of alcohol, tobacco or illicit drugs  C. Current medications and supplements D. Functional ability and status E.  Nutritional status F.  Physical activity G. Advance directives H. List of other physicians I.  Hospitalizations, surgeries, and ER visits in previous 12 months J.  Vitals K. Screenings to include hearing, vision, cognitive, depression L. Referrals and appointments - none  In addition, I reviewed preventive protocols, quality metrics, and best practice recommendations specific to patient. A written personalized care plan for preventive services as well as general  preventive health recommendations were provided to patient.  See attached scanned questionnaire for additional information.   Signed,   Randa Evens, MHA, BS, LPN Health Advisor 12/10/2015

## 2015-12-10 NOTE — Progress Notes (Signed)
Pre visit review using our clinic review tool, if applicable. No additional management support is needed unless otherwise documented below in the visit note. 

## 2016-02-17 ENCOUNTER — Other Ambulatory Visit: Payer: Self-pay | Admitting: Family Medicine

## 2016-03-19 ENCOUNTER — Other Ambulatory Visit: Payer: Self-pay

## 2016-03-19 MED ORDER — LISINOPRIL 10 MG PO TABS: 10.0000 mg | ORAL_TABLET | Freq: Every day | ORAL | Status: DC

## 2016-03-19 NOTE — Telephone Encounter (Signed)
Pt request refill lisinopril to [facility]. Pt saw Misty StanleyLisa for med wellness on 12/10/15; pt will cb to schedule CPX with Dr Dayton MartesAron. Refilled # 30 x1 until pt can schedule appt. Pt voiced understanding.

## 2016-04-22 ENCOUNTER — Encounter: Payer: Self-pay | Admitting: Family Medicine

## 2016-04-22 ENCOUNTER — Ambulatory Visit (INDEPENDENT_AMBULATORY_CARE_PROVIDER_SITE_OTHER): Payer: Medicare Other | Admitting: Family Medicine

## 2016-04-22 VITALS — BP 104/70 | HR 63 | Temp 98.0°F | Wt 124.2 lb

## 2016-04-22 DIAGNOSIS — I10 Essential (primary) hypertension: Secondary | ICD-10-CM

## 2016-04-22 DIAGNOSIS — Z0000 Encounter for general adult medical examination without abnormal findings: Secondary | ICD-10-CM

## 2016-04-22 DIAGNOSIS — K219 Gastro-esophageal reflux disease without esophagitis: Secondary | ICD-10-CM | POA: Diagnosis not present

## 2016-04-22 DIAGNOSIS — Z01419 Encounter for gynecological examination (general) (routine) without abnormal findings: Secondary | ICD-10-CM

## 2016-04-22 LAB — LIPID PANEL
CHOL/HDL RATIO: 3
CHOLESTEROL: 162 mg/dL (ref 0–200)
HDL: 53 mg/dL (ref >39.00)
LDL CALC: 89 mg/dL (ref 0–99)
NonHDL: 108.81
TRIGLYCERIDES: 99 mg/dL (ref 0.0–149.0)
VLDL: 19.8 mg/dL (ref 0.0–40.0)

## 2016-04-22 LAB — CBC WITH DIFFERENTIAL/PLATELET
BASOS ABS: 0 10*3/uL (ref 0.0–0.1)
BASOS PCT: 0.5 % (ref 0.0–3.0)
EOS ABS: 0.1 10*3/uL (ref 0.0–0.7)
Eosinophils Relative: 1.5 % (ref 0.0–5.0)
HEMATOCRIT: 37 % (ref 36.0–46.0)
HEMOGLOBIN: 12.3 g/dL (ref 12.0–15.0)
LYMPHS PCT: 25.6 % (ref 12.0–46.0)
Lymphs Abs: 1.5 10*3/uL (ref 0.7–4.0)
MCHC: 33.1 g/dL (ref 30.0–36.0)
MCV: 90.2 fl (ref 78.0–100.0)
Monocytes Absolute: 0.4 10*3/uL (ref 0.1–1.0)
Monocytes Relative: 7.4 % (ref 3.0–12.0)
Neutro Abs: 3.9 10*3/uL (ref 1.4–7.7)
Neutrophils Relative %: 65 % (ref 43.0–77.0)
Platelets: 213 10*3/uL (ref 150.0–400.0)
RBC: 4.11 Mil/uL (ref 3.87–5.11)
RDW: 14.2 % (ref 11.5–15.5)
WBC: 6 10*3/uL (ref 4.0–10.5)

## 2016-04-22 LAB — COMPREHENSIVE METABOLIC PANEL
ALBUMIN: 4.3 g/dL (ref 3.5–5.2)
ALT: 9 U/L (ref 0–35)
AST: 18 U/L (ref 0–37)
Alkaline Phosphatase: 37 U/L — ABNORMAL LOW (ref 39–117)
BILIRUBIN TOTAL: 0.4 mg/dL (ref 0.2–1.2)
BUN: 28 mg/dL — ABNORMAL HIGH (ref 6–23)
CALCIUM: 9.8 mg/dL (ref 8.4–10.5)
CO2: 31 mEq/L (ref 19–32)
CREATININE: 1.01 mg/dL (ref 0.40–1.20)
Chloride: 105 mEq/L (ref 96–112)
GFR: 70.83 mL/min (ref >60.00)
Glucose, Bld: 86 mg/dL (ref 70–99)
Potassium: 4.1 mEq/L (ref 3.5–5.1)
Sodium: 141 mEq/L (ref 135–145)
TOTAL PROTEIN: 7.8 g/dL (ref 6.0–8.3)

## 2016-04-22 LAB — TSH: TSH: 0.51 u[IU]/mL (ref 0.35–4.50)

## 2016-04-22 MED ORDER — LISINOPRIL 10 MG PO TABS: 10.0000 mg | ORAL_TABLET | Freq: Every day | ORAL | 11 refills | Status: DC

## 2016-04-22 MED ORDER — METOPROLOL SUCCINATE ER 25 MG PO TB24: 12.5000 mg | ORAL_TABLET | Freq: Every day | ORAL | 11 refills | Status: DC

## 2016-04-22 NOTE — Progress Notes (Signed)
Subjective:   Patient ID: Madison Miller, female    DOB: 1951-02-04, 65 y.o.   MRN: [id number]  Madison Miller is a pleasant 65 y.o. year old female who presents to clinic today with Follow-up  hypertension on 04/22/2016  HPI:  HTN- BP has been well controlled on current doses of Lisinopril Toprol, and Lasix. Was followed by cardiology, although it does not appear she has been seen recently.    Lab Results  Component Value Date   CREATININE 1.03 (H) 10/06/2015   Lab Results  Component Value Date   CHOL 158 10/06/2015   HDL 47 10/06/2015   LDLCALC 95 10/06/2015   TRIG 79 10/06/2015   CHOLHDL 3.4 10/06/2015   Lab Results  Component Value Date   NA 143 10/06/2015   K 3.8 10/06/2015   CL 104 10/06/2015   CO2 30 10/06/2015   Lab Results  Component Value Date   ALT 13 06/11/2014   AST 21 06/11/2014   ALKPHOS 45 06/11/2014   BILITOT 0.5 06/11/2014   Current Outpatient Prescriptions on File Prior to Visit  Medication Sig Dispense Refill  . acetaminophen (TYLENOL) 500 MG tablet Take 500 mg by mouth 2 (two) times daily as needed for headache.    . cholecalciferol (VITAMIN D) 1000 UNITS tablet Take 1,000 Units by mouth daily as needed (for supplementation).     . furosemide (LASIX) 40 MG tablet TAKE 1 TABLET BY MOUTH DAILY 30 tablet 4  . pantoprazole (PROTONIX) 40 MG tablet Take 1 tablet (40 mg total) by mouth 2 (two) times daily. 60 tablet 1   No current facility-administered medications on file prior to visit.     Allergies  Allergen Reactions  . Lactose Intolerance (Gi) Other (See Comments)    Pain,bloating    Past Medical History:  Diagnosis Date  . Allergic rhinitis due to pollen   . Cardiac LV ejection fraction >40%   . Chest pain   . CHF (congestive heart failure) (HCC)   . Degenerative joint disease of cervical and lumbar spine   . Degenerative lumbar disc   . Gallstones   . GERD (gastroesophageal reflux disease)   . HTN (hypertension)   .  Hyperparathyroidism   . Migraine, unspecified, with intractable migraine, so stated, without mention of status migrainosus   . Nephrolithiasis   . PAC (premature atrial contraction)   . Radiculopathy   . Right bundle branch block and left anterior fascicular block   . Spondylosis   . SVT (supraventricular tachycardia) (HCC)     Past Surgical History:  Procedure Laterality Date  . LUMBAR LAMINECTOMY  2010   Dr. Phoebe Perch  . PARATHYROIDECTOMY  2009    Family History  Problem Relation Age of Onset  . Other Mother     barin tumor  . Stroke Father   . Colon cancer Neg Hx   . Rectal cancer Neg Hx   . Stomach cancer Neg Hx     Social History   Social History  . Marital status: Married    Spouse name: N/A  . Number of children: 6  . Years of education: N/A   Occupational History  . Not on file.   Social History Main Topics  . Smoking status: Never Smoker  . Smokeless tobacco: Never Used  . Alcohol use No  . Drug use: No  . Sexual activity: No   Other Topics Concern  . Not on file   Social History Narrative  . No narrative  on file   The PMH, PSH, Social History, Family History, Medications, and allergies have been reviewed in [facility], and have been updated if relevant.   Review of Systems  Constitutional: Negative.   HENT: Negative.   Respiratory: Negative.   Cardiovascular: Negative.   Allergic/Immunologic: Negative.   Neurological: Negative.   Hematological: Negative.   Psychiatric/Behavioral: Negative.   All other systems reviewed and are negative.      Objective:    BP 104/70   Pulse 63   Temp 98 F (36.7 C) (Oral)   Wt 124 lb 4 oz (56.4 kg)   SpO2 99%   BMI 21.33 kg/m    Physical Exam  Constitutional: She is oriented to person, place, and time. She appears well-developed and well-nourished. No distress.  Eyes: Conjunctivae are normal.  Cardiovascular: Normal rate and regular rhythm.   Pulmonary/Chest: Effort normal and breath sounds normal.    Musculoskeletal: Normal range of motion.  Neurological: She is alert and oriented to person, place, and time. No cranial nerve deficit.  Skin: Skin is warm and dry. She is not diaphoretic.  Psychiatric: She has a normal mood and affect. Her behavior is normal. Judgment and thought content normal.  Nursing note and vitals reviewed.         Assessment & Plan:   Essential hypertension No Follow-up on file.

## 2016-04-22 NOTE — Assessment & Plan Note (Signed)
Well controlled. Check labs today. Continue current rxs.  No changes made.

## 2016-04-22 NOTE — Progress Notes (Signed)
Pre visit review using our clinic review tool, if applicable. No additional management support is needed unless otherwise documented below in the visit note. 

## 2016-04-23 ENCOUNTER — Encounter: Payer: Self-pay | Admitting: *Deleted

## 2016-07-21 ENCOUNTER — Other Ambulatory Visit: Payer: Self-pay | Admitting: Family Medicine

## 2016-08-11 ENCOUNTER — Emergency Department
Admission: EM | Admit: 2016-08-11 | Discharge: 2016-08-11 | Disposition: A | Discharge status: Home / Self Care | Payer: No Typology Code available for payment source | Attending: Emergency Medicine | Admitting: Emergency Medicine

## 2016-08-11 ENCOUNTER — Emergency Department: Payer: No Typology Code available for payment source

## 2016-08-11 DIAGNOSIS — Y939 Activity, unspecified: Secondary | ICD-10-CM | POA: Insufficient documentation

## 2016-08-11 DIAGNOSIS — Y999 Unspecified external cause status: Secondary | ICD-10-CM | POA: Insufficient documentation

## 2016-08-11 DIAGNOSIS — S20219A Contusion of unspecified front wall of thorax, initial encounter: Secondary | ICD-10-CM | POA: Insufficient documentation

## 2016-08-11 DIAGNOSIS — I509 Heart failure, unspecified: Secondary | ICD-10-CM | POA: Insufficient documentation

## 2016-08-11 DIAGNOSIS — Y92481 Parking lot as the place of occurrence of the external cause: Secondary | ICD-10-CM | POA: Diagnosis not present

## 2016-08-11 DIAGNOSIS — S161XXA Strain of muscle, fascia and tendon at neck level, initial encounter: Secondary | ICD-10-CM | POA: Diagnosis not present

## 2016-08-11 DIAGNOSIS — Z0389 Encounter for observation for other suspected diseases and conditions ruled out: Secondary | ICD-10-CM | POA: Diagnosis not present

## 2016-08-11 DIAGNOSIS — I110 Hypertensive heart disease with heart failure: Secondary | ICD-10-CM | POA: Insufficient documentation

## 2016-08-11 DIAGNOSIS — M542 Cervicalgia: Secondary | ICD-10-CM | POA: Diagnosis not present

## 2016-08-11 DIAGNOSIS — S199XXA Unspecified injury of neck, initial encounter: Secondary | ICD-10-CM | POA: Diagnosis not present

## 2016-08-11 MED ORDER — MELOXICAM 7.5 MG PO TABS: 7.5000 mg | ORAL_TABLET | Freq: Every day | ORAL | 0 refills | Status: AC

## 2016-08-11 MED ORDER — METHOCARBAMOL 500 MG PO TABS: 500.0000 mg | ORAL_TABLET | Freq: Four times a day (QID) | ORAL | 0 refills | Status: DC
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
## 2016-08-11 NOTE — Discharge Instructions (Signed)
Take the mobic in the morning. Take a muscle relaxer, Robaxin, at nighttime before bed.
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
## 2016-08-16 ENCOUNTER — Emergency Department
Admission: EM | Admit: 2016-08-16 | Discharge: 2016-08-16 | Disposition: A | Discharge status: Home / Self Care | Payer: No Typology Code available for payment source | Attending: Emergency Medicine | Admitting: Emergency Medicine

## 2016-08-16 ENCOUNTER — Emergency Department: Payer: No Typology Code available for payment source

## 2016-08-16 ENCOUNTER — Encounter: Payer: Self-pay | Admitting: Emergency Medicine

## 2016-08-16 DIAGNOSIS — S8991XA Unspecified injury of right lower leg, initial encounter: Secondary | ICD-10-CM | POA: Diagnosis present

## 2016-08-16 DIAGNOSIS — I509 Heart failure, unspecified: Secondary | ICD-10-CM | POA: Insufficient documentation

## 2016-08-16 DIAGNOSIS — Y92410 Unspecified street and highway as the place of occurrence of the external cause: Secondary | ICD-10-CM | POA: Insufficient documentation

## 2016-08-16 DIAGNOSIS — Y999 Unspecified external cause status: Secondary | ICD-10-CM | POA: Insufficient documentation

## 2016-08-16 DIAGNOSIS — I110 Hypertensive heart disease with heart failure: Secondary | ICD-10-CM | POA: Diagnosis not present

## 2016-08-16 DIAGNOSIS — S8011XA Contusion of right lower leg, initial encounter: Secondary | ICD-10-CM | POA: Insufficient documentation

## 2016-08-16 DIAGNOSIS — Z79899 Other long term (current) drug therapy: Secondary | ICD-10-CM | POA: Insufficient documentation

## 2016-08-16 DIAGNOSIS — Y939 Activity, unspecified: Secondary | ICD-10-CM | POA: Insufficient documentation

## 2016-08-16 MED ORDER — IBUPROFEN 600 MG PO TABS
600.0000 mg | ORAL_TABLET | Freq: Once | ORAL | Status: AC
  Administered 2016-08-16: 600 mg via ORAL
  Filled 2016-08-16: qty 1
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
## 2016-08-16 NOTE — ED Notes (Signed)
Eddie at bedside for portable xrays; rates right leg pain 5/10
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
## 2016-08-20 ENCOUNTER — Other Ambulatory Visit: Payer: Self-pay | Admitting: Family Medicine

## 2016-08-21 ENCOUNTER — Encounter (HOSPITAL_COMMUNITY): Payer: Self-pay | Admitting: *Deleted

## 2016-08-21 DIAGNOSIS — J3489 Other specified disorders of nose and nasal sinuses: Secondary | ICD-10-CM | POA: Diagnosis not present

## 2016-08-21 DIAGNOSIS — R0789 Other chest pain: Secondary | ICD-10-CM | POA: Insufficient documentation

## 2016-08-21 DIAGNOSIS — I509 Heart failure, unspecified: Secondary | ICD-10-CM | POA: Insufficient documentation

## 2016-08-21 DIAGNOSIS — R51 Headache: Secondary | ICD-10-CM | POA: Diagnosis not present

## 2016-08-21 DIAGNOSIS — I110 Hypertensive heart disease with heart failure: Secondary | ICD-10-CM | POA: Insufficient documentation

## 2016-08-21 DIAGNOSIS — Z79899 Other long term (current) drug therapy: Secondary | ICD-10-CM | POA: Diagnosis not present

## 2016-08-21 NOTE — ED Triage Notes (Signed)
The pt has had a headache nosebleed for 2 days and her bp has been elevated

## 2016-08-22 ENCOUNTER — Emergency Department (HOSPITAL_COMMUNITY)
Admission: EM | Admit: 2016-08-22 | Discharge: 2016-08-22 | Disposition: A | Discharge status: Home / Self Care | Payer: Medicare Other | Attending: Emergency Medicine | Admitting: Emergency Medicine

## 2016-08-22 DIAGNOSIS — J3489 Other specified disorders of nose and nasal sinuses: Secondary | ICD-10-CM | POA: Diagnosis not present

## 2016-08-22 DIAGNOSIS — R51 Headache: Secondary | ICD-10-CM

## 2016-08-22 DIAGNOSIS — R079 Chest pain, unspecified: Secondary | ICD-10-CM

## 2016-08-22 DIAGNOSIS — R519 Headache, unspecified: Secondary | ICD-10-CM

## 2016-08-22 LAB — I-STAT CHEM 8, ED
BUN: 18 mg/dL (ref 6–20)
CREATININE: 1 mg/dL (ref 0.44–1.00)
Calcium, Ion: 1.2 mmol/L (ref 1.15–1.40)
Chloride: 104 mmol/L (ref 101–111)
GLUCOSE: 101 mg/dL — AB (ref 65–99)
HEMATOCRIT: 32 % — AB (ref 36.0–46.0)
HEMOGLOBIN: 10.9 g/dL — AB (ref 12.0–15.0)
Potassium: 3.4 mmol/L — ABNORMAL LOW (ref 3.5–5.1)
Sodium: 143 mmol/L (ref 135–145)
TCO2: 28 mmol/L (ref 0–100)

## 2016-08-22 LAB — I-STAT TROPONIN, ED: Troponin i, poc: 0 ng/mL (ref 0.00–0.08)

## 2016-08-22 MED ORDER — DIPHENHYDRAMINE HCL 25 MG PO CAPS
25.0000 mg | ORAL_CAPSULE | Freq: Once | ORAL | Status: AC
  Administered 2016-08-22: 25 mg via ORAL
  Filled 2016-08-22: qty 1

## 2016-08-22 MED ORDER — IBUPROFEN 400 MG PO TABS
400.0000 mg | ORAL_TABLET | Freq: Once | ORAL | Status: AC
  Administered 2016-08-22: 400 mg via ORAL
  Filled 2016-08-22: qty 1

## 2016-08-22 NOTE — Discharge Instructions (Signed)
Take BENADRYL 25 mg every 4-6 hours and IBUPROFEN 400 mg every 4 hours for symptoms of congestion and sinus pressure. Follow up with your doctor for recheck if symptoms continue.

## 2016-08-22 NOTE — ED Provider Notes (Signed)
[facility]-EMERGENCY DEPT Provider Note   CSN: [id number] Arrival date & time: 08/21/16  2337     History   Chief Complaint Chief Complaint  Patient presents with  . Headache    HPI Madison Miller is a 65 y.o. female.  Patient presents with complaint of gradual onset frontal headache x 2 days. No fever, nausea, vomiting or visual changes. She also has some nasal congestion, sinus pressure and reports a mild nosebleed yesterday of short duration. No sore throat, cough or SOB. She states she has also had some chest pressure, also for 2 days, that is located in her left chest. No cough. No history of MI.   The history is provided by the patient. No language interpreter was used.  Headache   Pertinent negatives include no fever, no shortness of breath, no nausea and no vomiting.    Past Medical History:  Diagnosis Date  . Allergic rhinitis due to pollen   . Cardiac LV ejection fraction >40%   . Chest pain   . CHF (congestive heart failure) (HCC)   . Degenerative joint disease of cervical and lumbar spine   . Degenerative lumbar disc   . Gallstones   . GERD (gastroesophageal reflux disease)   . HTN (hypertension)   . Hyperparathyroidism   . Migraine, unspecified, with intractable migraine, so stated, without mention of status migrainosus   . Nephrolithiasis   . PAC (premature atrial contraction)   . Radiculopathy   . Right bundle branch block and left anterior fascicular block   . Spondylosis   . SVT (supraventricular tachycardia) [facility](HCC)     Patient Active Problem List   Diagnosis Date Noted  . Esophageal reflux   . GERD (gastroesophageal reflux disease)   . HTN (hypertension) 06/11/2014  . Claudication (HCC) 01/23/2012  . PALPITATIONS 09/03/2010  . RBB W/ LFAB 10/14/2008  . SPINAL STENOSIS 07/17/2008  . ALLERGIC RHINITIS, SEASONAL 07/03/2008  . FOOT PAIN, BILATERAL 05/08/2008  . SUPRAVENTRICULAR TACHYCARDIA, HX OF 02/23/2008  . DYSPEPSIA 02/13/2008  .  HYPERCALCEMIA 01/11/2008  . Lumbar pain with radiation down both legs 05/19/2007  . MIGRAINE NOS W/INTRACTABLE MIGRAINE 12/15/2006    Past Surgical History:  Procedure Laterality Date  . LUMBAR LAMINECTOMY  2010   Dr. Phoebe PerchHirsch  . PARATHYROIDECTOMY  2009    OB History    No data available       Home Medications    Prior to Admission medications   Medication Sig Start Date End Date Taking? Authorizing Provider  acetaminophen (TYLENOL) 500 MG tablet Take 500 mg by mouth 2 (two) times daily as needed for headache.   Yes Historical Provider, MD  furosemide (LASIX) 40 MG tablet TAKE 1 TABLET BY MOUTH DAILY 07/21/16  Yes Dianne Dunalia M Aron, MD  lisinopril (PRINIVIL,ZESTRIL) 10 MG tablet Take 1 tablet (10 mg total) by mouth daily. 04/22/16  Yes Dianne Dunalia M Aron, MD  meloxicam (MOBIC) 7.5 MG tablet Take 1 tablet (7.5 mg total) by mouth daily. 08/11/16 08/11/17 Yes Jonathan D Cuthriell, PA-C  methocarbamol (ROBAXIN) 500 MG tablet Take 1 tablet (500 mg total) by mouth 4 (four) times daily. 08/11/16  Yes Christiane HaJonathan D Cuthriell, PA-C  metoprolol succinate (TOPROL-XL) 25 MG 24 hr tablet Take 0.5 tablets (12.5 mg total) by mouth daily. 04/22/16  Yes Dianne Dunalia M Aron, MD  pantoprazole (PROTONIX) 40 MG tablet Take 1 tablet (40 mg total) by mouth 2 (two) times daily. Patient not taking: Reported on 08/22/2016 10/07/15   Vassie Lollarlos Madera, MD  Family History Family History  Problem Relation Age of Onset  . Other Mother     barin tumor  . Stroke Father   . Colon cancer Neg Hx   . Rectal cancer Neg Hx   . Stomach cancer Neg Hx     Social History Social History  Substance Use Topics  . Smoking status: Never Smoker  . Smokeless tobacco: Never Used  . Alcohol use No     Allergies   Lactose intolerance (gi)   Review of Systems Review of Systems  Constitutional: Negative for chills and fever.  HENT: Positive for congestion. Negative for sore throat.   Respiratory: Negative.  Negative for cough and shortness  of breath.   Cardiovascular: Positive for chest pain.  Gastrointestinal: Negative.  Negative for abdominal pain, nausea and vomiting.  Genitourinary: Negative.   Musculoskeletal: Negative.  Negative for neck pain and neck stiffness.  Neurological: Positive for headaches.     Physical Exam Updated Vital Signs BP 106/72   Pulse (!) 56   Temp 98.2 F (36.8 C) (Oral)   Resp 13   Ht 5\' 5"  (1.651 m)   Wt 56.7 kg   SpO2 100%   BMI 20.80 kg/m   Physical Exam  Constitutional: She is oriented to person, place, and time. She appears well-developed and well-nourished.  HENT:  Head: Normocephalic.  Nose: Mucosal edema present. Right sinus exhibits maxillary sinus tenderness. Left sinus exhibits maxillary sinus tenderness.  Mouth/Throat: Oropharynx is clear and moist. Mucous membranes are not dry.  Neck: Normal range of motion. Neck supple.  Cardiovascular: Normal rate and regular rhythm.   No murmur heard. Pulmonary/Chest: Effort normal and breath sounds normal. She has no wheezes. She has no rales. She exhibits no tenderness.  Abdominal: Soft. Bowel sounds are normal. There is no tenderness. There is no rebound and no guarding.  Musculoskeletal: Normal range of motion.  Neurological: She is alert and oriented to person, place, and time.  Skin: Skin is warm and dry. No rash noted.  Psychiatric: She has a normal mood and affect.     ED Treatments / Results  Labs (all labs ordered are listed, but only abnormal results are displayed) Labs Reviewed  I-STAT CHEM 8, ED - Abnormal; Notable for the following:       Result Value   Potassium 3.4 (*)    Glucose, Bld 101 (*)    Hemoglobin 10.9 (*)    HCT 32.0 (*)    All other components within normal limits  I-STAT TROPOININ, ED    EKG  EKG Interpretation  Date/Time:  Sunday August 22 2016 02:14:44 EST Ventricular Rate:  61 PR Interval:    QRS Duration: 149 QT Interval:  464 QTC Calculation: 468 R Axis:   -33 Text  Interpretation:  Sinus rhythm Right bundle branch block Baseline wander in lead(s) I aVL No significant change since last tracing Confirmed by [facility] MD, Denny PeonERIN ([id number]) on 08/22/2016 3:53:25 AM       Radiology No results found.  Procedures Procedures (including critical care time)  Medications Ordered in ED Medications  diphenhydrAMINE (BENADRYL) capsule 25 mg (25 mg Oral Given 08/22/16 0221)  ibuprofen (ADVIL,MOTRIN) tablet 400 mg (400 mg Oral Given 08/22/16 0221)     Initial Impression / Assessment and Plan / ED Course  I have reviewed the triage vital signs and the nursing notes.  Pertinent labs & imaging results that were available during my care of the patient were reviewed by me and considered in  my medical decision making (see chart for details).  Clinical Course     Patient with complaint of frontal headache with nosebleed and congestion. She has sinus tenderness over maxillary sinuses. No fever. Will treat sinusitis symptomatically.   She complains also of chest pain. Her EKG is unchanged from previous and troponin is negative. Symptoms x 2 days - Delta trop not felt necessary.     Final Clinical Impressions(s) / ED Diagnoses   Final diagnoses:  None   1. Sinusitis, nonbacterial 2. Nonspecific chest pain  New Prescriptions New Prescriptions   No medications on file     Elpidio Anis, PA-C 08/22/16 0425    Alvira Monday, MD 08/22/16 1825

## 2016-08-22 NOTE — ED Notes (Signed)
Pt sts her chest feels heavy.  PA Upstill notified.

## 2016-09-22 ENCOUNTER — Other Ambulatory Visit: Payer: Self-pay | Admitting: Family Medicine

## 2016-10-25 ENCOUNTER — Telehealth: Payer: Self-pay

## 2016-10-25 NOTE — Telephone Encounter (Signed)
Pt wanted to know name of doctor who had her in hospital for back problem in 07/21/2009; advised Dr Phoebe PerchHirsch and Elsner at [phone]. Pt said that was all needed.

## 2016-10-26 ENCOUNTER — Encounter: Payer: Self-pay | Admitting: Family Medicine

## 2016-10-26 ENCOUNTER — Ambulatory Visit (INDEPENDENT_AMBULATORY_CARE_PROVIDER_SITE_OTHER)
Admission: RE | Admit: 2016-10-26 | Discharge: 2016-10-26 | Disposition: A | Discharge status: Home / Self Care | Payer: Medicare Other | Source: Ambulatory Visit | Attending: Family Medicine | Admitting: Family Medicine

## 2016-10-26 ENCOUNTER — Ambulatory Visit (INDEPENDENT_AMBULATORY_CARE_PROVIDER_SITE_OTHER): Payer: Medicare Other | Admitting: Family Medicine

## 2016-10-26 VITALS — BP 122/68 | HR 67 | Temp 97.6°F | Wt 128.0 lb

## 2016-10-26 DIAGNOSIS — M545 Low back pain, unspecified: Secondary | ICD-10-CM

## 2016-10-26 MED ORDER — CYCLOBENZAPRINE HCL 5 MG PO TABS: 5.0000 mg | ORAL_TABLET | Freq: Three times a day (TID) | ORAL | 0 refills | Status: DC | PRN

## 2016-10-26 MED ORDER — LISINOPRIL 10 MG PO TABS: 10.0000 mg | ORAL_TABLET | Freq: Every day | ORAL | 2 refills | Status: DC

## 2016-10-26 MED ORDER — FUROSEMIDE 40 MG PO TABS: 40.0000 mg | ORAL_TABLET | Freq: Every day | ORAL | 2 refills | Status: DC

## 2016-10-26 MED ORDER — METOPROLOL SUCCINATE ER 25 MG PO TB24: 12.5000 mg | ORAL_TABLET | Freq: Every day | ORAL | 2 refills | Status: DC

## 2016-10-26 NOTE — Patient Instructions (Signed)
Great to see you. I will call you with your xray results. Take flexeril as needed for muscle spasms but this will make you sleepy.  Ok to keep taking Meloxicam.

## 2016-10-26 NOTE — Addendum Note (Signed)
Addended by: Dianne DunARON, Lamoyne Hessel M on: 10/26/2016 09:21 AM   Modules accepted: Orders

## 2016-10-26 NOTE — Progress Notes (Signed)
SUBJECTIVE:  Madison Miller is a 66 y.o. female who complains of low back pain for 4 day(s), positional with bending or lifting, without radiation down the legs. Precipitating factors: none recalled by the patient. Prior history of back problems: previous spinal surgery - . There is no numbness in the legs.  Current Outpatient Prescriptions on File Prior to Visit  Medication Sig Dispense Refill  . acetaminophen (TYLENOL) 500 MG tablet Take 500 mg by mouth 2 (two) times daily as needed for headache.    . furosemide (LASIX) 40 MG tablet TAKE 1 TABLET BY MOUTH DAILY 90 tablet 2  . lisinopril (PRINIVIL,ZESTRIL) 10 MG tablet Take 1 tablet (10 mg total) by mouth daily. 30 tablet 11  . meloxicam (MOBIC) 7.5 MG tablet Take 1 tablet (7.5 mg total) by mouth daily. 30 tablet 0  . methocarbamol (ROBAXIN) 500 MG tablet Take 1 tablet (500 mg total) by mouth 4 (four) times daily. 16 tablet 0  . metoprolol succinate (TOPROL-XL) 25 MG 24 hr tablet TAKE 1/2 TABLET BY MOUTH EVERY DAY. 45 tablet 2  . pantoprazole (PROTONIX) 40 MG tablet Take 1 tablet (40 mg total) by mouth 2 (two) times daily. 60 tablet 1   No current facility-administered medications on file prior to visit.     Allergies  Allergen Reactions  . Lactose Intolerance (Gi) Other (See Comments)    Pain,bloating    Past Medical History:  Diagnosis Date  . Allergic rhinitis due to pollen   . Cardiac LV ejection fraction >40%   . Chest pain   . CHF (congestive heart failure) (HCC)   . Degenerative joint disease of cervical and lumbar spine   . Degenerative lumbar disc   . Gallstones   . GERD (gastroesophageal reflux disease)   . HTN (hypertension)   . Hyperparathyroidism   . Migraine, unspecified, with intractable migraine, so stated, without mention of status migrainosus   . Nephrolithiasis   . PAC (premature atrial contraction)   . Radiculopathy   . Right bundle branch block and left anterior fascicular block   . Spondylosis   .  SVT (supraventricular tachycardia) (HCC)     Past Surgical History:  Procedure Laterality Date  . LUMBAR LAMINECTOMY  2010   Dr. Phoebe Perch  . PARATHYROIDECTOMY  2009    Family History  Problem Relation Age of Onset  . Other Mother     barin tumor  . Stroke Father   . Colon cancer Neg Hx   . Rectal cancer Neg Hx   . Stomach cancer Neg Hx     Social History   Social History  . Marital status: Married    Spouse name: N/A  . Number of children: 6  . Years of education: N/A   Occupational History  . Not on file.   Social History Main Topics  . Smoking status: Never Smoker  . Smokeless tobacco: Never Used  . Alcohol use No  . Drug use: No  . Sexual activity: No   Other Topics Concern  . Not on file   Social History Narrative  . No narrative on file   The PMH, PSH, Social History, Family History, Medications, and allergies have been reviewed in [facility], and have been updated if relevant.  OBJECTIVE:  BP 122/68   Pulse 67   Temp 97.6 F (36.4 C) (Oral)   Wt 128 lb (58.1 kg)   SpO2 99%   BMI 21.30 kg/m   Patient appears to be in mild to moderate  pain, antalgic gait noted. Lumbosacral spine area reveals no local tenderness or mass.  Painful and reduced LS ROM noted. Straight leg raise is neg bilaterally DTR's, motor strength and sensation normal, including heel and toe gait.  Peripheral pulses are palpable. X-Ray: ordered, but results not yet available.  ASSESSMENT:  degenerative disc disease without radiculopathy  PLAN: Xray today, e Rx sent for flexeril. For acute pain, rest, intermittent application of heat (do not sleep on heating pad), analgesics and muscle relaxants are recommended. Discussed longer term treatment plan of prn NSAID's and discussed a home back care exercise program with flexion exercise routine. Proper lifting with avoidance of heavy lifting discussed. Consider Physical Therapy  if not improving. Call or return to clinic prn if these symptoms worsen  or fail to improve as anticipated.

## 2016-10-26 NOTE — Progress Notes (Signed)
Pre visit review using our clinic review tool, if applicable. No additional management support is needed unless otherwise documented below in the visit note. 

## 2016-10-29 ENCOUNTER — Telehealth: Payer: Self-pay | Admitting: *Deleted

## 2016-10-29 NOTE — Telephone Encounter (Signed)
Form received indicating PA required for pt flexeril. Form placed in Dr Elmer SowAron's inbox for review and completion

## 2016-11-02 NOTE — Telephone Encounter (Signed)
Form faxed back to requested party;awaiting response 

## 2016-11-03 NOTE — Telephone Encounter (Signed)
Fax received indicating approval of pts requested medication. Valid 09/18/2016-09/19/2017 referral #[id number]

## 2016-11-03 NOTE — Telephone Encounter (Signed)
Copy faxed to pharmacy

## 2016-12-06 ENCOUNTER — Telehealth: Payer: Self-pay | Admitting: Family Medicine

## 2016-12-06 NOTE — Telephone Encounter (Signed)
Pt declined to schedule AWV at this time. Pt would like call back in June

## 2017-01-19 NOTE — Telephone Encounter (Signed)
Scheduled 01/25/17 °

## 2017-01-25 ENCOUNTER — Ambulatory Visit: Payer: Medicare Other

## 2017-02-08 ENCOUNTER — Ambulatory Visit: Payer: Medicare Other

## 2017-02-15 ENCOUNTER — Ambulatory Visit: Payer: Medicare Other | Admitting: Family Medicine

## 2017-02-15 ENCOUNTER — Ambulatory Visit: Payer: Medicare Other

## 2017-03-08 ENCOUNTER — Encounter: Payer: Self-pay | Admitting: Family Medicine

## 2017-03-08 ENCOUNTER — Ambulatory Visit (INDEPENDENT_AMBULATORY_CARE_PROVIDER_SITE_OTHER): Payer: Medicare Other

## 2017-03-08 ENCOUNTER — Ambulatory Visit (INDEPENDENT_AMBULATORY_CARE_PROVIDER_SITE_OTHER): Payer: Medicare Other | Admitting: Family Medicine

## 2017-03-08 VITALS — BP 118/74 | HR 65 | Temp 98.1°F | Ht 63.0 in | Wt 129.0 lb

## 2017-03-08 DIAGNOSIS — Z0000 Encounter for general adult medical examination without abnormal findings: Secondary | ICD-10-CM | POA: Diagnosis not present

## 2017-03-08 DIAGNOSIS — K219 Gastro-esophageal reflux disease without esophagitis: Secondary | ICD-10-CM

## 2017-03-08 DIAGNOSIS — I10 Essential (primary) hypertension: Secondary | ICD-10-CM | POA: Diagnosis not present

## 2017-03-08 LAB — CBC WITH DIFFERENTIAL/PLATELET
BASOS PCT: 0.7 % (ref 0.0–3.0)
Basophils Absolute: 0.1 10*3/uL (ref 0.0–0.1)
EOS PCT: 1.2 % (ref 0.0–5.0)
Eosinophils Absolute: 0.1 10*3/uL (ref 0.0–0.7)
HCT: 37.9 % (ref 36.0–46.0)
Hemoglobin: 12.4 g/dL (ref 12.0–15.0)
LYMPHS ABS: 1.9 10*3/uL (ref 0.7–4.0)
Lymphocytes Relative: 26.2 % (ref 12.0–46.0)
MCHC: 32.9 g/dL (ref 30.0–36.0)
MCV: 92.1 fl (ref 78.0–100.0)
MONO ABS: 0.4 10*3/uL (ref 0.1–1.0)
Monocytes Relative: 5.8 % (ref 3.0–12.0)
NEUTROS PCT: 66.1 % (ref 43.0–77.0)
Neutro Abs: 4.8 10*3/uL (ref 1.4–7.7)
Platelets: 200 10*3/uL (ref 150.0–400.0)
RBC: 4.11 Mil/uL (ref 3.87–5.11)
RDW: 14.4 % (ref 11.5–15.5)
WBC: 7.3 10*3/uL (ref 4.0–10.5)

## 2017-03-08 LAB — COMPREHENSIVE METABOLIC PANEL
ALBUMIN: 4.4 g/dL (ref 3.5–5.2)
ALT: 16 U/L (ref 0–35)
AST: 21 U/L (ref 0–37)
Alkaline Phosphatase: 41 U/L (ref 39–117)
BUN: 20 mg/dL (ref 6–23)
CHLORIDE: 104 meq/L (ref 96–112)
CO2: 31 mEq/L (ref 19–32)
Calcium: 9.9 mg/dL (ref 8.4–10.5)
Creatinine, Ser: 0.9 mg/dL (ref 0.40–1.20)
GFR: 80.69 mL/min (ref >60.00)
Glucose, Bld: 93 mg/dL (ref 70–99)
POTASSIUM: 4.1 meq/L (ref 3.5–5.1)
SODIUM: 141 meq/L (ref 135–145)
Total Bilirubin: 0.5 mg/dL (ref 0.2–1.2)
Total Protein: 7.6 g/dL (ref 6.0–8.3)

## 2017-03-08 LAB — TSH: TSH: 1.36 u[IU]/mL (ref 0.35–4.50)

## 2017-03-08 LAB — LIPID PANEL
CHOLESTEROL: 179 mg/dL (ref 0–200)
HDL: 50.1 mg/dL (ref >39.00)
LDL CALC: 105 mg/dL — AB (ref 0–99)
NonHDL: 128.98
Total CHOL/HDL Ratio: 4
Triglycerides: 120 mg/dL (ref 0.0–149.0)
VLDL: 24 mg/dL (ref 0.0–40.0)

## 2017-03-08 MED ORDER — METOPROLOL SUCCINATE ER 25 MG PO TB24: 12.5000 mg | ORAL_TABLET | Freq: Every day | ORAL | 2 refills | Status: DC

## 2017-03-08 MED ORDER — FUROSEMIDE 40 MG PO TABS: 40.0000 mg | ORAL_TABLET | Freq: Every day | ORAL | 2 refills | Status: DC

## 2017-03-08 MED ORDER — LISINOPRIL 10 MG PO TABS: 10.0000 mg | ORAL_TABLET | Freq: Every day | ORAL | 2 refills | Status: DC

## 2017-03-08 MED ORDER — PANTOPRAZOLE SODIUM 40 MG PO TBEC: 40.0000 mg | DELAYED_RELEASE_TABLET | Freq: Two times a day (BID) | ORAL | 1 refills | Status: DC

## 2017-03-08 NOTE — Progress Notes (Signed)
I reviewed health advisor's note, was available for consultation, and agree with documentation and plan.  

## 2017-03-08 NOTE — Patient Instructions (Signed)
Great to see you.  We will call you with your lab results from [facility].

## 2017-03-08 NOTE — Addendum Note (Signed)
Addended by: Dianne DunARON, Lailie Smead M on: 03/08/2017 11:17 AM   Modules accepted: Orders

## 2017-03-08 NOTE — Patient Instructions (Signed)
[name] , Thank you for taking time to come for your Medicare Wellness Visit. I appreciate your ongoing commitment to your health goals. Please review the following plan we discussed and let me know if I can assist you in the future.   These are the goals we discussed: Goals    . Increase physical activity          Starting 03/08/2017, I attempt to ride my stationary bike for at least 30 min 2 days per week.       This is a list of the screening recommended for you and due dates:  Health Maintenance  Topic Date Due  . Mammogram  11/19/2023*  . Pap Smear  11/19/2023*  .  Hepatitis C: One time screening is recommended by Center for Disease Control  (CDC) for  adults born from [birth year] through 1965.   11/19/2023*  . HIV Screening  11/19/2023*  . Tetanus Vaccine  03/08/2049*  . Pneumonia vaccines (1 of 2 - PCV13) 03/08/2049*  . Flu Shot  04/20/2017  . Colon Cancer Screening  05/05/2020  . DEXA scan (bone density measurement)  Completed  *Topic was postponed. The date shown is not the original due date.   Preventive Care for Adults  A healthy lifestyle and preventive care can promote health and wellness. Preventive health guidelines for adults include the following key practices.  . A routine yearly physical is a good way to check with your health care provider about your health and preventive screening. It is a chance to share any concerns and updates on your health and to receive a thorough exam.  . Visit your dentist for a routine exam and preventive care every 6 months. Brush your teeth twice a day and floss once a day. Good oral hygiene prevents tooth decay and gum disease.  . The frequency of eye exams is based on your age, health, family medical history, use  of contact lenses, and other factors. Follow your health care provider's ecommendations for frequency of eye exams.  . Eat a healthy diet. Foods like vegetables, fruits, whole grains, low-fat dairy products, and lean  protein foods contain the nutrients you need without too many calories. Decrease your intake of foods high in solid fats, added sugars, and salt. Eat the right amount of calories for you. Get information about a proper diet from your health care provider, if necessary.  . Regular physical exercise is one of the most important things you can do for your health. Most adults should get at least 150 minutes of moderate-intensity exercise (any activity that increases your heart rate and causes you to sweat) each week. In addition, most adults need muscle-strengthening exercises on 2 or more days a week.  Silver Sneakers may be a benefit available to you. To determine eligibility, you may visit the website: www.silversneakers.com or contact program at [phone] Mon-Fri between 8AM-8PM.   . Maintain a healthy weight. The body mass index (BMI) is a screening tool to identify possible weight problems. It provides an estimate of body fat based on height and weight. Your health care provider can find your BMI and can help you achieve or maintain a healthy weight.   For adults 20 years and older: ? A BMI below 18.5 is considered underweight. ? A BMI of 18.5 to 24.9 is normal. ? A BMI of 25 to 29.9 is considered overweight. ? A BMI of 30 and above is considered obese.   . Maintain normal blood lipids and cholesterol  levels by exercising and minimizing your intake of saturated fat. Eat a balanced diet with plenty of fruit and vegetables. Blood tests for lipids and cholesterol should begin at age 77 and be repeated every 5 years. If your lipid or cholesterol levels are high, you are over 50, or you are at high risk for heart disease, you may need your cholesterol levels checked more frequently. Ongoing high lipid and cholesterol levels should be treated with medicines if diet and exercise are not working.  . If you smoke, find out from your health care provider how to quit. If you do not use tobacco, please  do not start.  . If you choose to drink alcohol, please do not consume more than 2 drinks per day. One drink is considered to be 12 ounces (355 mL) of beer, 5 ounces (148 mL) of wine, or 1.5 ounces (44 mL) of liquor.  . If you are 49-81 years old, ask your health care provider if you should take aspirin to prevent strokes.  . Use sunscreen. Apply sunscreen liberally and repeatedly throughout the day. You should seek shade when your shadow is shorter than you. Protect yourself by wearing long sleeves, pants, a wide-brimmed hat, and sunglasses year round, whenever you are outdoors.  . Once a month, do a whole body skin exam, using a mirror to look at the skin on your back. Tell your health care provider of new moles, moles that have irregular borders, moles that are larger than a pencil eraser, or moles that have changed in shape or color.

## 2017-03-08 NOTE — Progress Notes (Signed)
Subjective:   Madison Miller is a 66 y.o. female who presents for Medicare Annual (Subsequent) preventive examination.  Review of Systems:  N/A Cardiac Risk Factors include: advanced age (>81men, >37 women);hypertension     Objective:     Vitals: BP 118/74 (BP Location: Right Arm, Patient Position: Sitting, Cuff Size: Normal)   Pulse 65   Temp 98.1 F (36.7 C) (Oral)   Ht 5\' 3"  (1.6 m) Comment: no shoes  Wt 129 lb (58.5 kg)   SpO2 98%   BMI 22.85 kg/m   Body mass index is 22.85 kg/m.   Tobacco History  Smoking Status  . Never Smoker  Smokeless Tobacco  . Never Used     Counseling given: No   Past Medical History:  Diagnosis Date  . Allergic rhinitis due to pollen   . Cardiac LV ejection fraction >40%   . Chest pain   . CHF (congestive heart failure) (HCC)   . Degenerative joint disease of cervical and lumbar spine   . Degenerative lumbar disc   . Gallstones   . GERD (gastroesophageal reflux disease)   . HTN (hypertension)   . Hyperparathyroidism   . Migraine, unspecified, with intractable migraine, so stated, without mention of status migrainosus   . Nephrolithiasis   . PAC (premature atrial contraction)   . Radiculopathy   . Right bundle branch block and left anterior fascicular block   . Spondylosis   . SVT (supraventricular tachycardia) (HCC)    Past Surgical History:  Procedure Laterality Date  . LUMBAR LAMINECTOMY  2010   Dr. Phoebe Perch  . PARATHYROIDECTOMY  2009   Family History  Problem Relation Age of Onset  . Other Mother        barin tumor  . Stroke Father   . Colon cancer Neg Hx   . Rectal cancer Neg Hx   . Stomach cancer Neg Hx    History  Sexual Activity  . Sexual activity: No    Outpatient Encounter Prescriptions as of 03/08/2017  Medication Sig  . acetaminophen (TYLENOL) 500 MG tablet Take 500 mg by mouth 2 (two) times daily as needed for headache.  . cyclobenzaprine (FLEXERIL) 5 MG tablet Take 1 tablet (5 mg total) by  mouth 3 (three) times daily as needed for muscle spasms.  . furosemide (LASIX) 40 MG tablet Take 1 tablet (40 mg total) by mouth daily.  Marland Kitchen lisinopril (PRINIVIL,ZESTRIL) 10 MG tablet Take 1 tablet (10 mg total) by mouth daily.  . meloxicam (MOBIC) 7.5 MG tablet Take 1 tablet (7.5 mg total) by mouth daily.  . metoprolol succinate (TOPROL-XL) 25 MG 24 hr tablet Take 0.5 tablets (12.5 mg total) by mouth daily.  . pantoprazole (PROTONIX) 40 MG tablet Take 1 tablet (40 mg total) by mouth 2 (two) times daily.   No facility-administered encounter medications on file as of 03/08/2017.     Activities of Daily Living In your present state of health, do you have any difficulty performing the following activities: 03/08/2017  Hearing? N  Vision? N  Difficulty concentrating or making decisions? N  Walking or climbing stairs? N  Dressing or bathing? N  Doing errands, shopping? N  Preparing Food and eating ? N  Using the Toilet? N  In the past six months, have you accidently leaked urine? N  Do you have problems with loss of bowel control? N  Managing your Medications? N  Managing your Finances? N  Housekeeping or managing your Housekeeping? N  Some recent  data might be hidden    Patient Care Team: Dianne Dun, MD as PCP - General (Family Medicine)    Assessment:     Hearing Screening   125Hz  250Hz  500Hz  1000Hz  2000Hz  3000Hz  4000Hz  6000Hz  8000Hz   Right ear:   40 40 40  40    Left ear:   40 40 40  40      Visual Acuity Screening   Right eye Left eye Both eyes  Without correction:     With correction: 20/25-1 20/20 20/20    Exercise Activities and Dietary recommendations Current Exercise Habits: The patient does not participate in regular exercise at present, Exercise limited by: None identified  Goals    . Increase physical activity          Starting 03/08/2017, I attempt to ride my stationary bike for at least 30 min 2 days per week.      Fall Risk Fall Risk  03/08/2017 12/10/2015   Falls in the past year? Yes No  Number falls in past yr: 1 -  Injury with Fall? Yes -   Depression Screen PHQ 2/9 Scores 03/08/2017 12/10/2015  PHQ - 2 Score 0 0     Cognitive Function MMSE - Mini Mental State Exam 03/08/2017 12/10/2015  Orientation to time 5 5  Orientation to Place 5 5  Registration 3 3  Attention/ Calculation 0 0  Recall 3 3  Language- name 2 objects 0 0  Language- repeat 1 1  Language- follow 3 step command 3 3  Language- read & follow direction 0 0  Write a sentence 0 0  Copy design 0 0  Total score 20 20     PLEASE NOTE: A Mini-Cog screen was completed. Maximum score is 20. A value of 0 denotes this part of Folstein MMSE was not completed or the patient failed this part of the Mini-Cog screening.   Mini-Cog Screening Orientation to Time - Max 5 pts Orientation to Place - Max 5 pts Registration - Max 3 pts Recall - Max 3 pts Language Repeat - Max 1 pts Language Follow 3 Step Command - Max 3 pts      There is no immunization history on file for this patient. Screening Tests Health Maintenance  Topic Date Due  . MAMMOGRAM  11/19/2023 (Originally 09/15/2001)  . PAP SMEAR  11/19/2023 (Originally 09/15/1972)  . Hepatitis C Screening  11/19/2023 (Originally 1951/03/07)  . HIV Screening  11/19/2023 (Originally 09/15/1966)  . TETANUS/TDAP  03/08/2049 (Originally 09/15/1970)  . PNA vac Low Risk Adult (1 of 2 - PCV13) 03/08/2049 (Originally 09/15/2016)  . INFLUENZA VACCINE  04/20/2017  . COLONOSCOPY  05/05/2020  . DEXA SCAN  Completed      Plan:     I have personally reviewed and addressed the Medicare Annual Wellness questionnaire and have noted the following in the patient's chart:  A. Medical and social history B. Use of alcohol, tobacco or illicit drugs  C. Current medications and supplements D. Functional ability and status E.  Nutritional status F.  Physical activity G. Advance directives H. List of other physicians I.  Hospitalizations,  surgeries, and ER visits in previous 12 months J.  Vitals K. Screenings to include hearing, vision, cognitive, depression L. Referrals and appointments - none  In addition, I have reviewed and discussed with patient certain preventive protocols, quality metrics, and best practice recommendations. A written personalized care plan for preventive services as well as general preventive health recommendations were provided to patient.  See attached scanned questionnaire for additional information.   Signed,   Randa EvensLesia Dollye Glasser, MHA, BS, LPN Health Coach

## 2017-03-08 NOTE — Progress Notes (Signed)
PCP notes:   Health maintenance:  Pt declines all immunizations and preventive screenings.   Abnormal screenings:   Fall risk - hx of fall with injury and no medical treatment  Patient concerns:   Pt reports pain in upper left chest region. Pain scale: 9/10. Onset: approx. 5 days ago.   Pt reports she does not have any more Protonix and needs refill.  Nurse concerns:  None  Next PCP appt:   03/08/17 @ 1100

## 2017-03-08 NOTE — Progress Notes (Signed)
Subjective:   Patient ID: Madison Miller, female    DOB: [birth year], 66 y.o.   MRN: [id number]  Madison Miller is a pleasant 66 y.o. year old female who presents to clinic today with Annual Exam  on 03/08/2017  HPI:  Annual medicare wellness visit with Madison Duffel, RN this morning.  HTN- BP has been well controlled on current doses of Lisinopril Toprol, and Lasix. Was followed by cardiology, although it does not appear she has been seen recently.  Lab Results  Component Value Date   CREATININE 1.00 08/22/2016   Lab Results  Component Value Date   NA 143 08/22/2016   K 3.4 (L) 08/22/2016   CL 104 08/22/2016   CO2 31 04/22/2016   Lab Results  Component Value Date   WBC 6.0 04/22/2016   HGB 10.9 (L) 08/22/2016   HCT 32.0 (L) 08/22/2016   MCV 90.2 04/22/2016   PLT 213.0 04/22/2016   Lab Results  Component Value Date   TSH 0.51 04/22/2016   Lab Results  Component Value Date   CHOL 162 04/22/2016   HDL 53.00 04/22/2016   LDLCALC 89 04/22/2016   TRIG 99.0 04/22/2016   CHOLHDL 3 04/22/2016   Current Outpatient Prescriptions on File Prior to Visit  Medication Sig Dispense Refill  . acetaminophen (TYLENOL) 500 MG tablet Take 500 mg by mouth 2 (two) times daily as needed for headache.    . cyclobenzaprine (FLEXERIL) 5 MG tablet Take 1 tablet (5 mg total) by mouth 3 (three) times daily as needed for muscle spasms. 30 tablet 0  . furosemide (LASIX) 40 MG tablet Take 1 tablet (40 mg total) by mouth daily. 90 tablet 2  . lisinopril (PRINIVIL,ZESTRIL) 10 MG tablet Take 1 tablet (10 mg total) by mouth daily. 90 tablet 2  . meloxicam (MOBIC) 7.5 MG tablet Take 1 tablet (7.5 mg total) by mouth daily. 30 tablet 0  . metoprolol succinate (TOPROL-XL) 25 MG 24 hr tablet Take 0.5 tablets (12.5 mg total) by mouth daily. 90 tablet 2  . pantoprazole (PROTONIX) 40 MG tablet Take 1 tablet (40 mg total) by mouth 2 (two) times daily. (Patient not taking: Reported on 03/08/2017) 60  tablet 1   No current facility-administered medications on file prior to visit.     Allergies  Allergen Reactions  . Lactose Intolerance (Gi) Other (See Comments)    Pain,bloating    Past Medical History:  Diagnosis Date  . Allergic rhinitis due to pollen   . Cardiac LV ejection fraction >40%   . Chest pain   . CHF (congestive heart failure) (HCC)   . Degenerative joint disease of cervical and lumbar spine   . Degenerative lumbar disc   . Gallstones   . GERD (gastroesophageal reflux disease)   . HTN (hypertension)   . Hyperparathyroidism   . Migraine, unspecified, with intractable migraine, so stated, without mention of status migrainosus   . Nephrolithiasis   . PAC (premature atrial contraction)   . Radiculopathy   . Right bundle branch block and left anterior fascicular block   . Spondylosis   . SVT (supraventricular tachycardia) (HCC)     Past Surgical History:  Procedure Laterality Date  . LUMBAR LAMINECTOMY  2010   Dr. Phoebe Perch  . PARATHYROIDECTOMY  2009    Family History  Problem Relation Age of Onset  . Other Mother        barin tumor  . Stroke Father   . Colon cancer Neg Hx   .  Rectal cancer Neg Hx   . Stomach cancer Neg Hx     Social History   Social History  . Marital status: Married    Spouse name: N/A  . Number of children: 6  . Years of education: N/A   Occupational History  . Not on file.   Social History Main Topics  . Smoking status: Never Smoker  . Smokeless tobacco: Never Used  . Alcohol use No  . Drug use: No  . Sexual activity: No   Other Topics Concern  . Not on file   Social History Narrative  . No narrative on file   The PMH, PSH, Social History, Family History, Medications, and allergies [facility], and have been updated if relevant.    Review of Systems  Constitutional: Negative.   HENT: Negative.   Eyes: Negative.   Respiratory: Negative.   Cardiovascular: Negative.   Gastrointestinal: Negative.         +GERD  Endocrine: Negative.   Genitourinary: Negative.   Musculoskeletal: Negative.   Allergic/Immunologic: Negative.   Neurological: Negative.   Hematological: Negative.   Psychiatric/Behavioral: Negative.   All other systems reviewed and are negative.      Objective:    BP 118/74   Pulse 65   Temp 98.1 F (36.7 C) (Oral)   Ht 5\' 3"  (1.6 m) Comment: no shoes  Wt 129 lb (58.5 kg)   SpO2 98%   BMI 22.85 kg/m   Wt Readings from Last 3 Encounters:  03/08/17 129 lb (58.5 kg)  03/08/17 129 lb (58.5 kg)  10/26/16 128 lb (58.1 kg)    Physical Exam   General:  Well-developed,well-nourished,in no acute distress; alert,appropriate and cooperative throughout examination Head:  normocephalic and atraumatic.   Eyes:  vision grossly intact, PERRL Ears:  R ear normal and L ear normal externally, TMs clear bilaterally Nose:  no external deformity.   Mouth:  good dentition.   Neck:  No deformities, masses, or tenderness noted. Lungs:  Normal respiratory effort, chest expands symmetrically. Lungs are clear to auscultation, no crackles or wheezes. Heart:  Normal rate and regular rhythm. S1 and S2 normal without gallop, murmur, click, rub or other extra sounds. Abdomen:  Bowel sounds positive,abdomen soft and non-tender without masses, organomegaly or hernias noted. Msk:  No deformity or scoliosis noted of thoracic or lumbar spine.   Extremities:  No clubbing, cyanosis, edema, or deformity noted with normal full range of motion of all joints.   Neurologic:  alert & oriented X3 and gait normal.   Skin:  Intact without suspicious lesions or rashes Psych:  Cognition and judgment appear intact. Alert and cooperative with normal attention span and concentration. No apparent delusions, illusions, hallucinations       Assessment & Plan:   Essential hypertension  Gastroesophageal reflux disease, esophagitis presence not specified No Follow-up on file.

## 2017-03-08 NOTE — Progress Notes (Signed)
Pre visit review using our clinic review tool, if applicable. No additional management support is needed unless otherwise documented below in the visit note. 

## 2017-03-08 NOTE — Addendum Note (Signed)
Addended by: Liane ComberHAVERS, NATASHA C on: 03/08/2017 11:20 AM   Modules accepted: Orders

## 2017-03-08 NOTE — Assessment & Plan Note (Signed)
Deteriorated. Stop taking her protonix. eRx refills sent.

## 2017-03-08 NOTE — Assessment & Plan Note (Signed)
Well controlled on current rxs. No changes made today. 

## 2017-03-22 ENCOUNTER — Telehealth: Payer: Self-pay | Admitting: Family Medicine

## 2017-03-22 NOTE — Telephone Encounter (Signed)
Handicapped plate form placed in RX tower.

## 2017-03-22 NOTE — Telephone Encounter (Signed)
Placed for in Dr Elmer SowAron's inbox to review

## 2017-03-31 NOTE — Telephone Encounter (Signed)
Signed and mailed per patient.

## 2017-04-26 ENCOUNTER — Ambulatory Visit: Payer: Medicare Other | Admitting: Family Medicine

## 2017-06-07 ENCOUNTER — Telehealth: Payer: Self-pay | Admitting: Family Medicine

## 2017-06-07 NOTE — Telephone Encounter (Signed)
Pt dropped off a [employer] handicapped license plate application form to be filled out. Placed on cart for the MD.

## 2017-06-08 NOTE — Telephone Encounter (Signed)
Patient notified form is ready.  Form mailed to patient per patient request.

## 2017-06-17 ENCOUNTER — Encounter (HOSPITAL_COMMUNITY): Payer: Self-pay | Admitting: Emergency Medicine

## 2017-06-17 DIAGNOSIS — R42 Dizziness and giddiness: Secondary | ICD-10-CM | POA: Insufficient documentation

## 2017-06-17 DIAGNOSIS — I110 Hypertensive heart disease with heart failure: Secondary | ICD-10-CM | POA: Insufficient documentation

## 2017-06-17 DIAGNOSIS — I509 Heart failure, unspecified: Secondary | ICD-10-CM | POA: Insufficient documentation

## 2017-06-17 DIAGNOSIS — R112 Nausea with vomiting, unspecified: Secondary | ICD-10-CM | POA: Diagnosis not present

## 2017-06-17 DIAGNOSIS — R109 Unspecified abdominal pain: Secondary | ICD-10-CM | POA: Diagnosis not present

## 2017-06-17 DIAGNOSIS — G43009 Migraine without aura, not intractable, without status migrainosus: Secondary | ICD-10-CM | POA: Insufficient documentation

## 2017-06-17 DIAGNOSIS — Z79899 Other long term (current) drug therapy: Secondary | ICD-10-CM | POA: Diagnosis not present

## 2017-06-17 DIAGNOSIS — R51 Headache: Secondary | ICD-10-CM | POA: Diagnosis not present

## 2017-06-17 LAB — CBC WITH DIFFERENTIAL/PLATELET
BASOS ABS: 0 10*3/uL (ref 0.0–0.1)
Basophils Relative: 0 %
EOS PCT: 0 %
Eosinophils Absolute: 0 10*3/uL (ref 0.0–0.7)
HEMATOCRIT: 41.2 % (ref 36.0–46.0)
Hemoglobin: 13.7 g/dL (ref 12.0–15.0)
LYMPHS ABS: 0.8 10*3/uL (ref 0.7–4.0)
Lymphocytes Relative: 6 %
MCH: 30 pg (ref 26.0–34.0)
MCHC: 33.3 g/dL (ref 30.0–36.0)
MCV: 90.4 fL (ref 78.0–100.0)
MONOS PCT: 2 %
Monocytes Absolute: 0.3 10*3/uL (ref 0.1–1.0)
NEUTROS ABS: 12 10*3/uL — AB (ref 1.7–7.7)
Neutrophils Relative %: 92 %
PLATELETS: 270 10*3/uL (ref 150–400)
RBC: 4.56 MIL/uL (ref 3.87–5.11)
RDW: 13.4 % (ref 11.5–15.5)
WBC: 13 10*3/uL — ABNORMAL HIGH (ref 4.0–10.5)

## 2017-06-17 LAB — COMPREHENSIVE METABOLIC PANEL
ALK PHOS: 45 U/L (ref 38–126)
ALT: 12 U/L — ABNORMAL LOW (ref 14–54)
ANION GAP: 12 (ref 5–15)
AST: 25 U/L (ref 15–41)
Albumin: 4.5 g/dL (ref 3.5–5.0)
BUN: 9 mg/dL (ref 6–20)
CHLORIDE: 99 mmol/L — AB (ref 101–111)
CO2: 26 mmol/L (ref 22–32)
Calcium: 9.2 mg/dL (ref 8.9–10.3)
Creatinine, Ser: 0.98 mg/dL (ref 0.44–1.00)
GFR calc Af Amer: >60 mL/min (ref >60)
GFR calc non Af Amer: 59 mL/min — ABNORMAL LOW (ref >60)
GLUCOSE: 156 mg/dL — AB (ref 65–99)
POTASSIUM: 3.1 mmol/L — AB (ref 3.5–5.1)
Sodium: 137 mmol/L (ref 135–145)
Total Bilirubin: 1 mg/dL (ref 0.3–1.2)
Total Protein: 8.2 g/dL — ABNORMAL HIGH (ref 6.5–8.1)

## 2017-06-17 MED ORDER — OXYCODONE-ACETAMINOPHEN 5-325 MG PO TABS
ORAL_TABLET | ORAL | Status: AC
  Filled 2017-06-17: qty 1

## 2017-06-17 MED ORDER — OXYCODONE-ACETAMINOPHEN 5-325 MG PO TABS
1.0000 | ORAL_TABLET | Freq: Once | ORAL | Status: AC
  Administered 2017-06-17: 1 via ORAL

## 2017-06-17 NOTE — ED Triage Notes (Signed)
Patient reports headache with nausea ,dizziness  and emesis onset yesterday .

## 2017-06-18 ENCOUNTER — Emergency Department (HOSPITAL_COMMUNITY): Payer: Medicare Other

## 2017-06-18 ENCOUNTER — Emergency Department (HOSPITAL_COMMUNITY)
Admission: EM | Admit: 2017-06-18 | Discharge: 2017-06-18 | Disposition: A | Discharge status: Home / Self Care | Payer: Medicare Other | Attending: Emergency Medicine | Admitting: Emergency Medicine

## 2017-06-18 DIAGNOSIS — G43009 Migraine without aura, not intractable, without status migrainosus: Secondary | ICD-10-CM

## 2017-06-18 DIAGNOSIS — R112 Nausea with vomiting, unspecified: Secondary | ICD-10-CM

## 2017-06-18 DIAGNOSIS — R42 Dizziness and giddiness: Secondary | ICD-10-CM

## 2017-06-18 DIAGNOSIS — R51 Headache: Secondary | ICD-10-CM | POA: Diagnosis not present

## 2017-06-18 LAB — URINALYSIS, ROUTINE W REFLEX MICROSCOPIC
BACTERIA UA: NONE SEEN
BILIRUBIN URINE: NEGATIVE
Glucose, UA: NEGATIVE mg/dL
KETONES UR: 20 mg/dL — AB
Nitrite: NEGATIVE
Protein, ur: 30 mg/dL — AB
SPECIFIC GRAVITY, URINE: 1.017 (ref 1.005–1.030)
pH: 6 (ref 5.0–8.0)

## 2017-06-18 LAB — LIPASE, BLOOD: Lipase: 34 U/L (ref 11–51)

## 2017-06-18 MED ORDER — METOCLOPRAMIDE HCL 5 MG/ML IJ SOLN
10.0000 mg | Freq: Once | INTRAMUSCULAR | Status: AC
  Administered 2017-06-18: 10 mg via INTRAVENOUS
  Filled 2017-06-18: qty 2

## 2017-06-18 MED ORDER — KETOROLAC TROMETHAMINE 15 MG/ML IJ SOLN
15.0000 mg | Freq: Once | INTRAMUSCULAR | Status: AC
  Administered 2017-06-18: 15 mg via INTRAVENOUS
  Filled 2017-06-18: qty 1

## 2017-06-18 MED ORDER — SODIUM CHLORIDE 0.9 % IV BOLUS (SEPSIS)
1000.0000 mL | Freq: Once | INTRAVENOUS | Status: AC
  Administered 2017-06-18: 1000 mL via INTRAVENOUS

## 2017-06-18 MED ORDER — ONDANSETRON 8 MG PO TBDP: 8.0000 mg | ORAL_TABLET | Freq: Three times a day (TID) | ORAL | 0 refills | Status: DC | PRN

## 2017-06-18 MED ORDER — POTASSIUM CHLORIDE CRYS ER 20 MEQ PO TBCR
40.0000 meq | EXTENDED_RELEASE_TABLET | Freq: Once | ORAL | Status: AC
  Administered 2017-06-18: 40 meq via ORAL
  Filled 2017-06-18: qty 2

## 2017-06-18 MED ORDER — DIPHENHYDRAMINE HCL 50 MG/ML IJ SOLN
12.5000 mg | Freq: Once | INTRAMUSCULAR | Status: AC
  Administered 2017-06-18: 12.5 mg via INTRAVENOUS
  Filled 2017-06-18: qty 1

## 2017-06-18 NOTE — ED Notes (Signed)
Attempted to obtain urine specimen; Pt unable to provide one at this time 

## 2017-06-18 NOTE — ED Notes (Addendum)
Attempted to ambulate pt in hall; pt walked to doorway of room, became "woozy", and became unsteady on her feet; pt helped back to bed, PA notified

## 2017-06-18 NOTE — ED Provider Notes (Signed)
[facility]-EMERGENCY DEPT Provider Note   CSN: [id number] Arrival date & time: 06/17/17  2255     History   Chief Complaint Chief Complaint  Patient presents with  . Headache    HPI Madison Miller is a 66 y.o. female with history significant for migraines, CHF, SVT presenting with a progressive onset headache with associated nausea, dizziness and vomiting 4 times over the last 2 days. She reports that the initial symptom was the headache then dizziness and then experienced nausea and vomiting with generalized abdominal discomfort. She reports she believes she might be something bad. No focal pain. Normal bowel movement yesterday. Her dizziness is only when she attempts to stand and feels lightheaded, no spinning sensation. Denies fever, chills, diarrhea, hematuria, dysuria, blood in her stool, cold symptoms, shortness of breath or other symptoms.  HPI  Past Medical History:  Diagnosis Date  . Allergic rhinitis due to pollen   . Cardiac LV ejection fraction >40%   . Chest pain   . CHF (congestive heart failure) (HCC)   . Degenerative joint disease of cervical and lumbar spine   . Degenerative lumbar disc   . Gallstones   . GERD (gastroesophageal reflux disease)   . HTN (hypertension)   . Hyperparathyroidism   . Migraine, unspecified, with intractable migraine, so stated, without mention of status migrainosus   . Nephrolithiasis   . PAC (premature atrial contraction)   . Radiculopathy   . Right bundle branch block and left anterior fascicular block   . Spondylosis   . SVT (supraventricular tachycardia) [facility])     Patient Active Problem List   Diagnosis Date Noted  . Esophageal reflux   . GERD (gastroesophageal reflux disease)   . HTN (hypertension) 06/11/2014  . Claudication (HCC) 01/23/2012  . RBB W/ LFAB 10/14/2008  . SPINAL STENOSIS 07/17/2008  . ALLERGIC RHINITIS, SEASONAL 07/03/2008  . SUPRAVENTRICULAR TACHYCARDIA, HX OF 02/23/2008  . HYPERCALCEMIA 01/11/2008  .  MIGRAINE NOS W/INTRACTABLE MIGRAINE 12/15/2006    Past Surgical History:  Procedure Laterality Date  . LUMBAR LAMINECTOMY  2010   Dr. Phoebe Perch  . PARATHYROIDECTOMY  2009    OB History    No data available       Home Medications    Prior to Admission medications   Medication Sig Start Date End Date Taking? Authorizing Provider  acetaminophen (TYLENOL) 500 MG tablet Take 500 mg by mouth 2 (two) times daily as needed for headache.   Yes [provider]  furosemide (LASIX) 40 MG tablet Take 1 tablet (40 mg total) by mouth daily. 03/08/17  Yes Dianne Dun, MD  lisinopril (PRINIVIL,ZESTRIL) 10 MG tablet Take 1 tablet (10 mg total) by mouth daily. 03/08/17  Yes Dianne Dun, MD  metoprolol succinate (TOPROL-XL) 25 MG 24 hr tablet Take 0.5 tablets (12.5 mg total) by mouth daily. 03/08/17  Yes Dianne Dun, MD  pantoprazole (PROTONIX) 40 MG tablet Take 1 tablet (40 mg total) by mouth 2 (two) times daily. Patient taking differently: Take 40 mg by mouth 2 (two) times daily as needed (reflux).  03/08/17  Yes Dianne Dun, MD  cyclobenzaprine (FLEXERIL) 5 MG tablet Take 1 tablet (5 mg total) by mouth 3 (three) times daily as needed for muscle spasms. 10/26/16   Dianne Dun, MD  meloxicam (MOBIC) 7.5 MG tablet Take 1 tablet (7.5 mg total) by mouth daily. Patient not taking: Reported on 06/18/2017 08/11/16 08/11/17  Cuthriell, Delorise Royals, PA-C  ondansetron (ZOFRAN ODT) 8 MG  disintegrating tablet Take 1 tablet (8 mg total) by mouth every 8 (eight) hours as needed for nausea or vomiting. 06/18/17   Azalia Bilis, MD    Family History Family History  Problem Relation Age of Onset  . Other Mother        barin tumor  . Stroke Father   . Colon cancer Neg Hx   . Rectal cancer Neg Hx   . Stomach cancer Neg Hx     Social History Social History  Substance Use Topics  . Smoking status: Never Smoker  . Smokeless tobacco: Never Used  . Alcohol use No     Allergies   Lactose intolerance  (gi)   Review of Systems Review of Systems  Constitutional: Negative for chills, diaphoresis and fever.  HENT: Negative for congestion, ear pain and sore throat.   Eyes: Negative for photophobia, pain, redness and visual disturbance.  Respiratory: Negative for cough, choking, chest tightness, shortness of breath, wheezing and stridor.   Cardiovascular: Negative for chest pain, palpitations and leg swelling.  Gastrointestinal: Positive for abdominal pain, nausea and vomiting. Negative for abdominal distention, blood in stool and diarrhea.  Genitourinary: Negative for difficulty urinating, dysuria, flank pain, frequency and hematuria.  Musculoskeletal: Negative for arthralgias, myalgias, neck pain and neck stiffness.  Skin: Negative for color change, pallor and rash.  Neurological: Positive for light-headedness and headaches. Negative for seizures, syncope, speech difficulty, weakness and numbness.     Physical Exam Updated Vital Signs BP 121/83   Pulse 70   Temp 98 F (36.7 C) (Oral)   Resp 18   SpO2 100%   Physical Exam  Constitutional: She is oriented to person, place, and time. She appears well-developed and well-nourished. No distress.  Afebrile, ill-appearing, uncomfortable in bed in no acute distress.  HENT:  Head: Normocephalic and atraumatic.  Mouth/Throat: Oropharynx is clear and moist. No oropharyngeal exudate.  Eyes: Pupils are equal, round, and reactive to light. EOM are normal. Right eye exhibits no discharge. Left eye exhibits no discharge.  Pale conjunctiva  Neck: Normal range of motion. Neck supple.  Cardiovascular: Normal rate, regular rhythm, normal heart sounds and intact distal pulses.   No murmur heard. Pulmonary/Chest: Effort normal and breath sounds normal. No respiratory distress. She has no wheezes. She has no rales. She exhibits no tenderness.  Abdominal: Soft. She exhibits no distension and no mass. There is tenderness. There is no rebound and no  guarding.  Generalized discomfort but no focal tenderness to palpation.  Musculoskeletal: Normal range of motion. She exhibits no edema or deformity.  Neurological: She is alert and oriented to person, place, and time. No cranial nerve deficit or sensory deficit. She exhibits normal muscle tone. Coordination normal.  Neurologic Exam:  - Mental status: Patient is alert and cooperative. Fluent speech and words are clear. Coherent thought processes and insight is good. Patient is oriented x 4 to person, place, time and event.  - Cranial nerves:  CN III, IV, VI: pupils equally round, reactive to light both direct and conscensual. Full extra-ocular movement. CN V: motor temporalis and masseter strength intact. CN VII : muscles of facial expression intact. CN X :  midline uvula. XI strength of sternocleidomastoid and trapezius muscles 5/5, XII: tongue is midline when protruded. - Motor: No involuntary movements. Muscle tone and bulk normal throughout. Muscle strength is 5/5 in bilateral shoulder abduction, elbow flexion and extension, grip, hip extension, flexion, leg flexion and extension, ankle dorsiflexion and plantar flexion.  - Sensory: Proprioception,  light tough sensation intact in all extremities.  - Cerebellar: rapid alternating movements and point to point movement intact in upper and lower extremities.  Patient was able to stand but was unsteady and couldn't walk on initial assessment. She was lightheaded and needed to sit back down. Will reassess after treatment. Patient later ambulated in the hall with normal stance and gait.  Skin: Skin is warm and dry. She is not diaphoretic.  Psychiatric: She has a normal mood and affect.  Nursing note and vitals reviewed.    ED Treatments / Results  Labs (all labs ordered are listed, but only abnormal results are displayed) Labs Reviewed  CBC WITH DIFFERENTIAL/PLATELET - Abnormal; Notable for the following:       Result Value   WBC 13.0 (*)     Neutro Abs 12.0 (*)    All other components within normal limits  COMPREHENSIVE METABOLIC PANEL - Abnormal; Notable for the following:    Potassium 3.1 (*)    Chloride 99 (*)    Glucose, Bld 156 (*)    Total Protein 8.2 (*)    ALT 12 (*)    GFR calc non Af Amer 59 (*)    All other components within normal limits  URINALYSIS, ROUTINE W REFLEX MICROSCOPIC - Abnormal; Notable for the following:    Hgb urine dipstick LARGE (*)    Ketones, ur 20 (*)    Protein, ur 30 (*)    Leukocytes, UA SMALL (*)    Squamous Epithelial / LPF 0-5 (*)    All other components within normal limits  LIPASE, BLOOD    EKG  EKG Interpretation  Date/Time:  Saturday June 18 2017 07:47:00 EDT Ventricular Rate:  68 PR Interval:    QRS Duration: 147 QT Interval:  450 QTC Calculation: 479 R Axis:   -113 Text Interpretation:  Sinus rhythm RBBB and LAFB No significant change since last tracing Confirmed by Gilda Crease [phone]) on 06/18/2017 8:22:36 AM       Radiology Ct Head Wo Contrast  Result Date: 06/18/2017 CLINICAL DATA:  66 year old female with acute headache and dizziness for 2 days. EXAM: CT HEAD WITHOUT CONTRAST TECHNIQUE: Contiguous axial images were obtained from the base of the skull through the vertex without intravenous contrast. COMPARISON:  None. FINDINGS: Brain: No evidence of acute infarction, hemorrhage, hydrocephalus, extra-axial collection or mass lesion/mass effect. Generalized cerebral volume loss and small remote left cerebellar infarct noted. Vascular: No hyperdense vessel or unexpected calcification. Skull: Normal. Negative for fracture or focal lesion. Sinuses/Orbits: No acute finding. Other: None. IMPRESSION: 1. No evidence of acute intracranial abnormality 2. Mild atrophy and small remote left cerebellar infarct. Electronically Signed   By: Harmon Pier M.D.   On: 06/18/2017 08:05    Procedures Procedures (including critical care time)  Medications Ordered in  ED Medications  oxyCODONE-acetaminophen (PERCOCET/ROXICET) 5-325 MG per tablet (not administered)  oxyCODONE-acetaminophen (PERCOCET/ROXICET) 5-325 MG per tablet 1 tablet (1 tablet Oral Given 06/17/17 2308)  sodium chloride 0.9 % bolus 1,000 mL (0 mLs Intravenous Stopped 06/18/17 0857)  metoCLOPramide (REGLAN) injection 10 mg (10 mg Intravenous Given 06/18/17 0716)  diphenhydrAMINE (BENADRYL) injection 12.5 mg (12.5 mg Intravenous Given 06/18/17 0716)  potassium chloride SA (K-DUR,KLOR-CON) CR tablet 40 mEq (40 mEq Oral Given 06/18/17 0716)  ketorolac (TORADOL) 15 MG/ML injection 15 mg (15 mg Intravenous Given 06/18/17 1211)  sodium chloride 0.9 % bolus 1,000 mL (0 mLs Intravenous Stopped 06/18/17 1437)     Initial Impression / Assessment and  Plan / ED Course  I have reviewed the triage vital signs and the nursing notes.  Pertinent labs & imaging results that were available during my care of the patient were reviewed by me and considered in my medical decision making (see chart for details).    Patient presenting with progressive onset headache, lightheadedness, abdominal discomfort with associated nausea/vomiting. Overall reassuring exam, normal neuro. Although on initial assessment patient is too lightheaded to stand on her own and walk. She normally ambulates without assistance. Lungs are clear bilaterally and no lower extremity edema. No signs of fluid overload.  She may be dehydrated.  CT head negative for acute intracranial abnormality Labs unremarkable other than hypokalemia, given potassium EKG unchanged from prior tracing Normal hemoglobin  Orthostatic vitals negative from lying supine to sitting, patient stated that she could not stand up at the time nurse tried to take orthostatic vitals.  Patient was given a migraine cocktail and will reassess On reassessment, patient was sleeping comfortably and reported improvement. Patient had been here all night without sleeping.  Will  fluid challenge and ambulate.  Access for PO challenge. Patient was able to ambulate with normal stance and gait in the hall.  Overall significant improvement. Normal vital signs and stable, afebrile nontoxic.  Patient will be discharged home with symptomatic relief and close PCP follow-up.  Discussed strict return precautions and advised to return to the emergency department if experiencing any new or worsening symptoms. Instructions were understood and patient agreed with discharge plan. Final Clinical Impressions(s) / ED Diagnoses   Final diagnoses:  Migraine without aura and without status migrainosus, not intractable  Dizziness  Non-intractable vomiting with nausea, unspecified vomiting type    New Prescriptions Discharge Medication List as of 06/18/2017  2:35 PM    START taking these medications   Details  ondansetron (ZOFRAN ODT) 8 MG disintegrating tablet Take 1 tablet (8 mg total) by mouth every 8 (eight) hours as needed for nausea or vomiting., Starting Sat 06/18/2017, Print         Georgiana Shore, PA-C 06/18/17 1515    Gilda Crease, MD 06/27/17 [phone]

## 2017-09-30 ENCOUNTER — Encounter: Payer: Self-pay | Admitting: Family Medicine

## 2017-09-30 ENCOUNTER — Ambulatory Visit (INDEPENDENT_AMBULATORY_CARE_PROVIDER_SITE_OTHER): Payer: Medicare Other | Admitting: Family Medicine

## 2017-09-30 VITALS — BP 108/62 | HR 65 | Temp 97.9°F | Ht 63.0 in | Wt 125.5 lb

## 2017-09-30 DIAGNOSIS — N898 Other specified noninflammatory disorders of vagina: Secondary | ICD-10-CM | POA: Insufficient documentation

## 2017-09-30 DIAGNOSIS — R300 Dysuria: Secondary | ICD-10-CM | POA: Insufficient documentation

## 2017-09-30 DIAGNOSIS — M545 Low back pain, unspecified: Secondary | ICD-10-CM | POA: Insufficient documentation

## 2017-09-30 LAB — POC URINALSYSI DIPSTICK (AUTOMATED)
BILIRUBIN UA: NEGATIVE
GLUCOSE UA: NEGATIVE
KETONES UA: NEGATIVE
Leukocytes, UA: NEGATIVE
Nitrite, UA: NEGATIVE
Protein, UA: NEGATIVE
SPEC GRAV UA: 1.02 (ref 1.010–1.025)
Urobilinogen, UA: 0.2 E.U./dL
pH, UA: 6 (ref 5.0–8.0)

## 2017-09-30 MED ORDER — CYCLOBENZAPRINE HCL 5 MG PO TABS: 5.0000 mg | ORAL_TABLET | Freq: Three times a day (TID) | ORAL | 0 refills | Status: DC | PRN

## 2017-09-30 MED ORDER — FLUCONAZOLE 150 MG PO TABS: 150.0000 mg | ORAL_TABLET | Freq: Once | ORAL | 0 refills | Status: AC

## 2017-09-30 NOTE — Progress Notes (Signed)
Subjective:    Patient ID: Madison Miller, female    DOB: Jul 19, 1951, 67 y.o.   MRN: [id number]  HPI 66yo pt of Dr Dayton Martes herd with low back pain   Ua is clear with tr blood  She has ? Hx of kidney stones in the past - in the kidney -never passed one   She has had a laminectomy in the past H/o spinal stenosis   Pain in L side of low back  That is the place she often has pain  Pain is not severe   Some urinary burning  L side of labia it burns and itches also  ? If poss yeast- she got otc cream and it helped some and then came back   Results for orders placed or performed in visit on 09/30/17  POCT Urinalysis Dipstick (Automated)  Result Value Ref Range   Color, UA Straw    Clarity, UA Clear    Glucose, UA Negative    Bilirubin, UA Negative    Ketones, UA Negative    Spec Grav, UA 1.020 1.010 - 1.025   Blood, UA Trace    pH, UA 6.0 5.0 - 8.0   Protein, UA Negative    Urobilinogen, UA 0.2 0.2 or 1.0 E.U./dL   Nitrite, UA Negative    Leukocytes, UA Negative Negative    Patient Active Problem List   Diagnosis Date Noted  . Vaginal itching 09/30/2017  . Low back pain 09/30/2017  . Dysuria 09/30/2017  . Esophageal reflux   . GERD (gastroesophageal reflux disease)   . HTN (hypertension) 06/11/2014  . Claudication (HCC) 01/23/2012  . RBB W/ LFAB 10/14/2008  . SPINAL STENOSIS 07/17/2008  . ALLERGIC RHINITIS, SEASONAL 07/03/2008  . SUPRAVENTRICULAR TACHYCARDIA, HX OF 02/23/2008  . HYPERCALCEMIA 01/11/2008  . MIGRAINE NOS W/INTRACTABLE MIGRAINE 12/15/2006   Past Medical History:  Diagnosis Date  . Allergic rhinitis due to pollen   . Cardiac LV ejection fraction >40%   . Chest pain   . CHF (congestive heart failure) (HCC)   . Degenerative joint disease of cervical and lumbar spine   . Degenerative lumbar disc   . Gallstones   . GERD (gastroesophageal reflux disease)   . HTN (hypertension)   . Hyperparathyroidism   . Migraine, unspecified, with intractable  migraine, so stated, without mention of status migrainosus   . Nephrolithiasis   . PAC (premature atrial contraction)   . Radiculopathy   . Right bundle branch block and left anterior fascicular block   . Spondylosis   . SVT (supraventricular tachycardia) (HCC)    Past Surgical History:  Procedure Laterality Date  . LUMBAR LAMINECTOMY  2010   Dr. Phoebe Perch  . PARATHYROIDECTOMY  2009   Social History   Tobacco Use  . Smoking status: Never Smoker  . Smokeless tobacco: Never Used  Substance Use Topics  . Alcohol use: No    Alcohol/week: 0.0 oz  . Drug use: No   Family History  Problem Relation Age of Onset  . Other Mother        barin tumor  . Stroke Father   . Colon cancer Neg Hx   . Rectal cancer Neg Hx   . Stomach cancer Neg Hx    Allergies  Allergen Reactions  . Lactose Intolerance (Gi) Other (See Comments)    Pain,bloating   Current Outpatient Medications on File Prior to Visit  Medication Sig Dispense Refill  . acetaminophen (TYLENOL) 500 MG tablet Take 500 mg by mouth  2 (two) times daily as needed for headache.    . furosemide (LASIX) 40 MG tablet Take 1 tablet (40 mg total) by mouth daily. 90 tablet 2  . lisinopril (PRINIVIL,ZESTRIL) 10 MG tablet Take 1 tablet (10 mg total) by mouth daily. 90 tablet 2  . metoprolol succinate (TOPROL-XL) 25 MG 24 hr tablet Take 0.5 tablets (12.5 mg total) by mouth daily. 90 tablet 2  . ondansetron (ZOFRAN ODT) 8 MG disintegrating tablet Take 1 tablet (8 mg total) by mouth every 8 (eight) hours as needed for nausea or vomiting. 10 tablet 0  . pantoprazole (PROTONIX) 40 MG tablet Take 1 tablet (40 mg total) by mouth 2 (two) times daily. (Patient taking differently: Take 40 mg by mouth 2 (two) times daily as needed (reflux). ) 60 tablet 1   No current facility-administered medications on file prior to visit.       Review of Systems  Constitutional: Negative for activity change, appetite change, fatigue, fever and unexpected weight  change.  HENT: Negative for congestion, ear pain, rhinorrhea, sinus pressure and sore throat.   Eyes: Negative for pain, redness and visual disturbance.  Respiratory: Negative for cough, shortness of breath and wheezing.   Cardiovascular: Negative for chest pain and palpitations.  Gastrointestinal: Negative for abdominal pain, blood in stool, constipation and diarrhea.  Endocrine: Negative for polydipsia and polyuria.  Genitourinary: Negative for dysuria, frequency, genital sores, hematuria, urgency, vaginal discharge and vaginal pain.  Musculoskeletal: Positive for back pain. Negative for arthralgias and myalgias.  Skin: Negative for pallor and rash.  Allergic/Immunologic: Negative for environmental allergies.  Neurological: Negative for dizziness, syncope and headaches.  Hematological: Negative for adenopathy. Does not bruise/bleed easily.  Psychiatric/Behavioral: Negative for decreased concentration and dysphoric mood. The patient is not nervous/anxious.        Objective:   Physical Exam  Constitutional: She appears well-developed and well-nourished. No distress.  Well appearing   HENT:  Head: Normocephalic and atraumatic.  Eyes: Conjunctivae and EOM are normal. Pupils are equal, round, and reactive to light. No scleral icterus.  Neck: Normal range of motion. Neck supple.  Cardiovascular: Normal rate and regular rhythm.  Pulmonary/Chest: Effort normal and breath sounds normal. She has no wheezes. She has no rales.  Abdominal: Soft. Bowel sounds are normal. She exhibits no distension and no mass. There is no tenderness.  No suprapubic tenderness or fullness No blood in urine   Genitourinary:  Genitourinary Comments: Nl appearing labia/vulva   No vaginal d/c   Wet prep yields yeast buds   Musculoskeletal: She exhibits tenderness.       Lumbar back: She exhibits decreased range of motion, tenderness and spasm. She exhibits no bony tenderness and no edema.  L lumbar tenderness    Lower lumbar spinal tenderness   Limited flexion/extension Neg slr  Nl rom of hips   Lymphadenopathy:    She has no cervical adenopathy.  Neurological: She is alert. She has normal strength and normal reflexes. She displays no atrophy. No cranial nerve deficit or sensory deficit. She exhibits normal muscle tone. Coordination normal.  Negative SLR  Skin: Skin is warm and dry. No rash noted. No erythema. No pallor.  Psychiatric: She has a normal mood and affect.          Assessment & Plan:   Problem List Items Addressed This Visit      Musculoskeletal and Integument   Vaginal itching    Some yeast buds on wet prep noted   tx with  diflucan  Also culture urine  Will update       Relevant Orders   POCT Wet Prep [facility] [place] (Completed)     Other   Dysuria    ua is clear except for tr of blood  Some yeast buds on wet prep- tx with diflucan  UC sent Update       Relevant Orders   Urine Culture (Completed)   Low back pain - Primary    Suspect MSK in pt with hx of spinal stenosis and laminectomy in the past  Recommend heat/ muscle relaxer px  May benefit from PT  If neuro symptoms develop- MRI/specialist f/u  She does not have an orthopedic specialist at this time  ua is clear with tr of blood (doubt she is passing stone since pain is mild)  cx sent       Relevant Medications   cyclobenzaprine (FLEXERIL) 5 MG tablet   Other Relevant Orders   POCT Urinalysis Dipstick (Automated) (Completed)   Urine Culture (Completed)

## 2017-09-30 NOTE — Patient Instructions (Addendum)
Use heat on back for 10 minutes at a time Try the flexeril   If no improvement - it may be wise to set you up with orthopedics   Take the diflucan for your yeast infection   We will send urine for a culture to look for infection - we will alert you when that returns

## 2017-10-01 LAB — URINE CULTURE
MICRO NUMBER: 90046818
Result:: NO GROWTH
SPECIMEN QUALITY: ADEQUATE

## 2017-10-02 LAB — POCT WET PREP (WET MOUNT)
KOH Wet Prep POC: NEGATIVE
TRICHOMONAS WET PREP HPF POC: ABSENT

## 2017-10-02 NOTE — Assessment & Plan Note (Signed)
Suspect MSK in pt with hx of spinal stenosis and laminectomy in the past  Recommend heat/ muscle relaxer px  May benefit from PT  If neuro symptoms develop- MRI/specialist f/u  She does not have an orthopedic specialist at this time  ua is clear with tr of blood (doubt she is passing stone since pain is mild)  cx sent

## 2017-10-02 NOTE — Assessment & Plan Note (Signed)
ua is clear except for tr of blood  Some yeast buds on wet prep- tx with diflucan  UC sent Update

## 2017-10-02 NOTE — Assessment & Plan Note (Signed)
Some yeast buds on wet prep noted   tx with diflucan  Also culture urine  Will update

## 2017-10-14 IMAGING — CR DG CHEST 2V
3 series · 3 of 3 positions shown · non-contrast
Comparison: None.

CLINICAL DATA: Chest radiograph 10/06/2015

EXAM:
CHEST  2 VIEW

[chest pa]
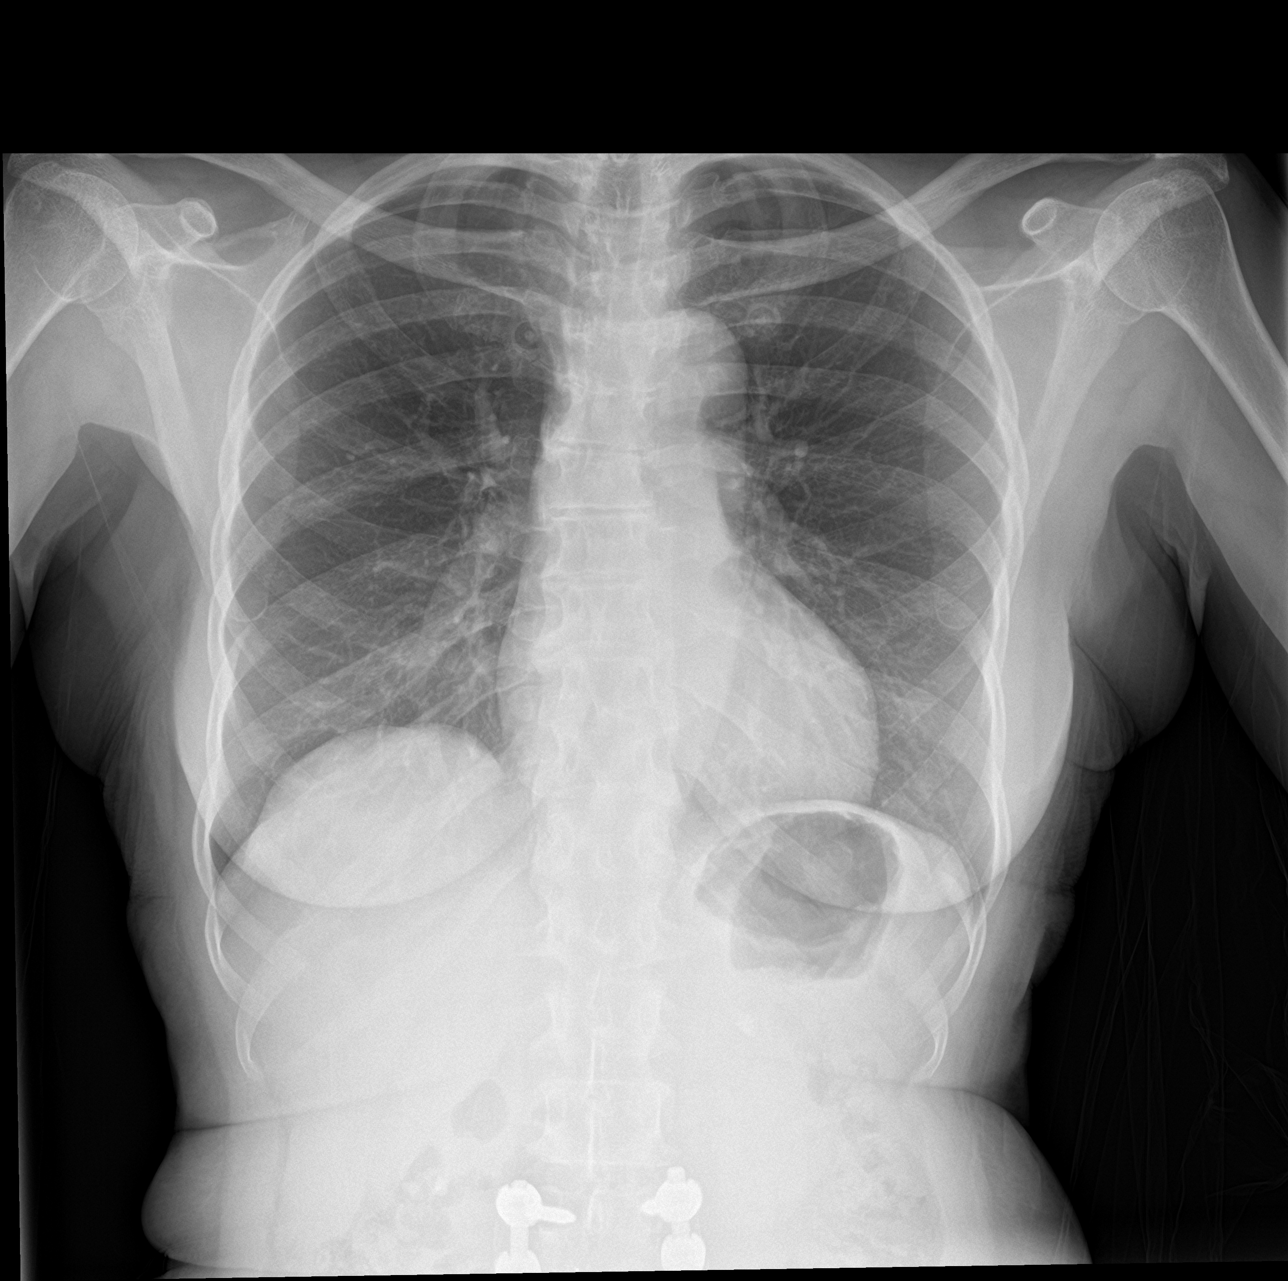

[chest lat (1 of 2)]
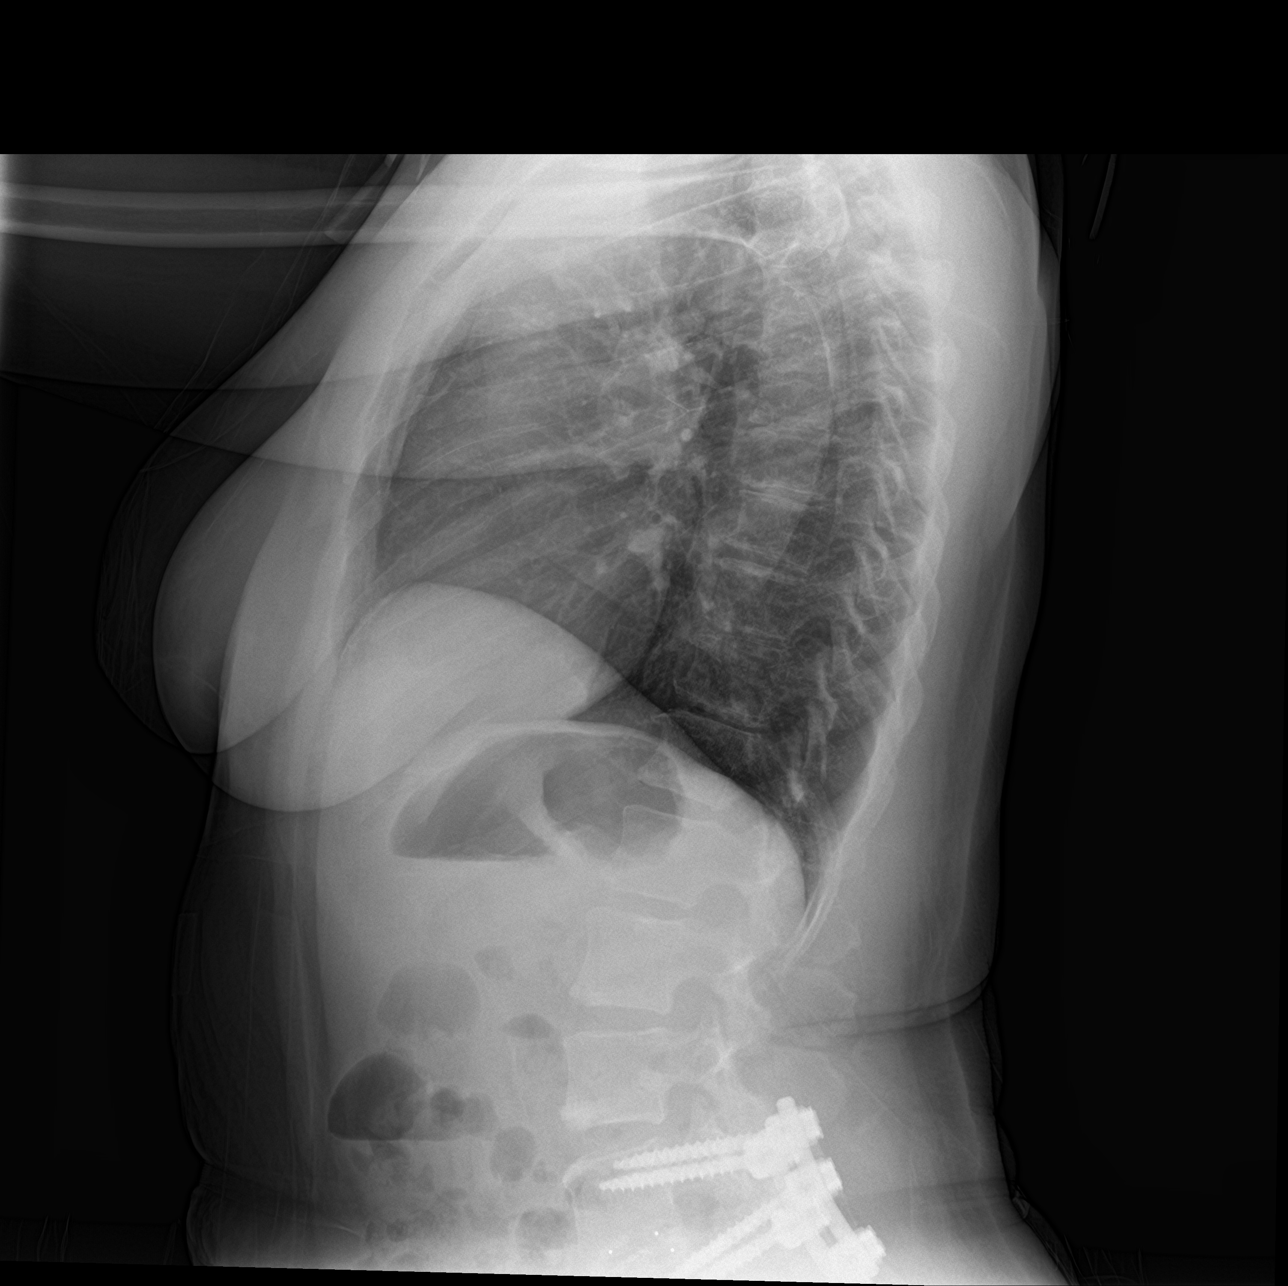

[chest lat (2 of 2)]
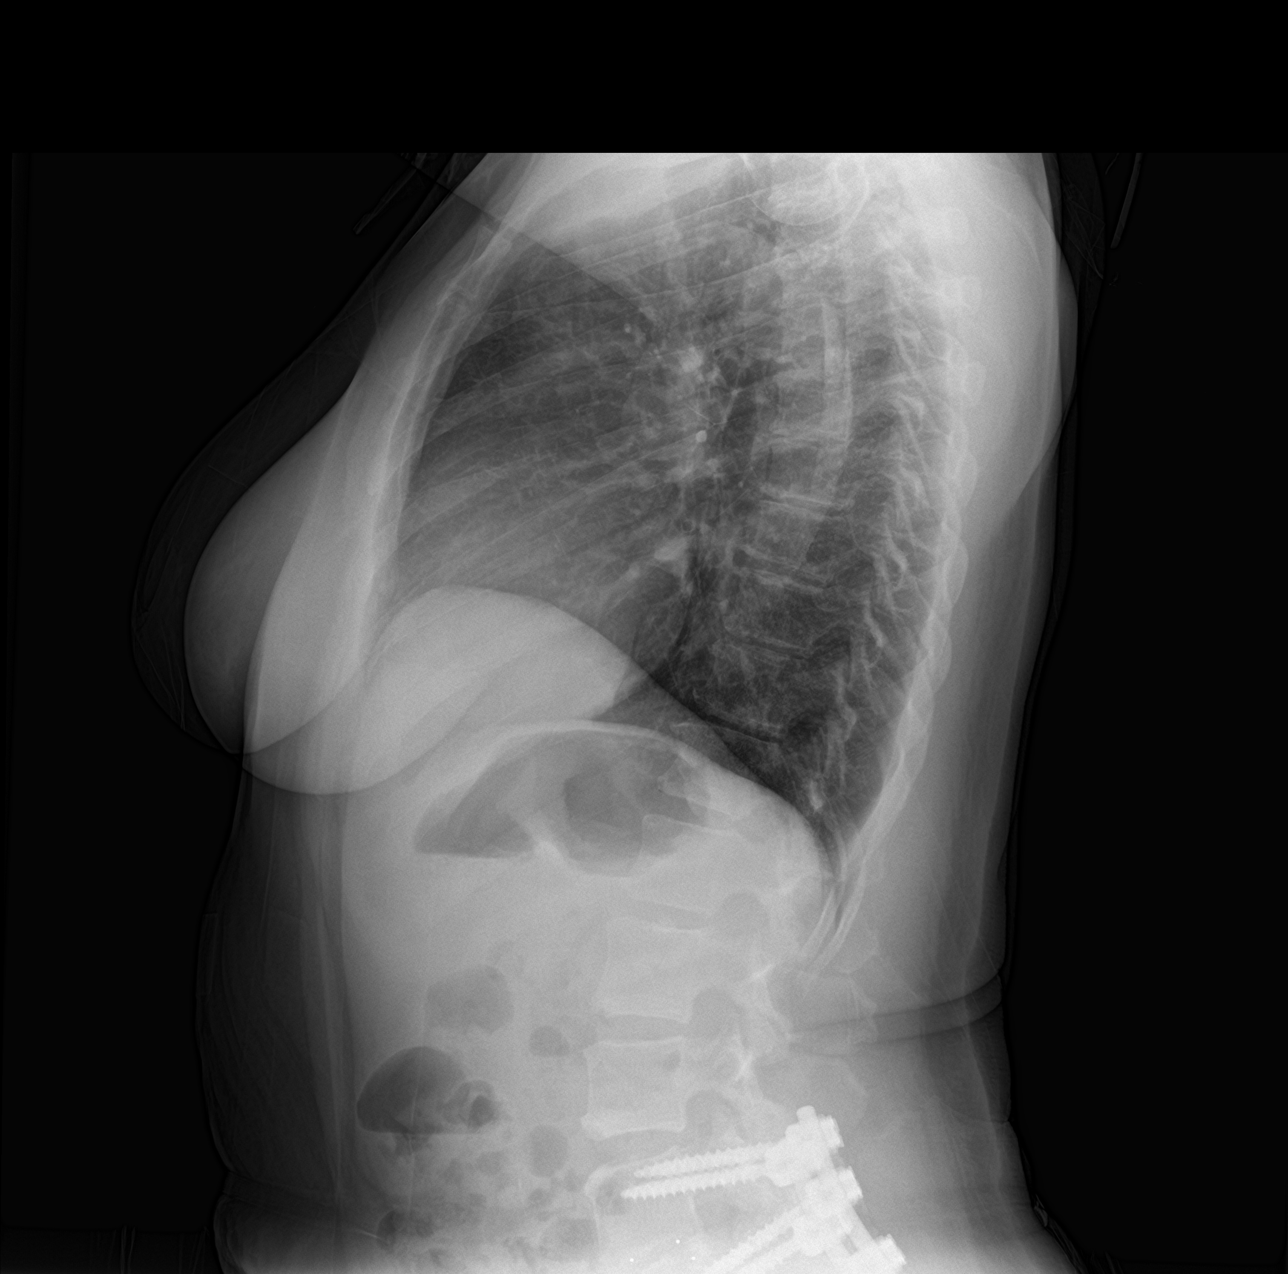

[3 of 3 positions shown; findings below may reference images not displayed]

FINDINGS: Cardiomediastinal contours are normal. No pneumothorax or pleural
effusion.

No focal airspace consolidation or pulmonary edema.
IMPRESSION: Clear lungs.

## 2017-10-17 ENCOUNTER — Ambulatory Visit (INDEPENDENT_AMBULATORY_CARE_PROVIDER_SITE_OTHER): Payer: Medicare Other | Admitting: Internal Medicine

## 2017-10-17 ENCOUNTER — Encounter: Payer: Self-pay | Admitting: Internal Medicine

## 2017-10-17 VITALS — BP 110/72 | HR 82 | Temp 97.7°F | Wt 125.8 lb

## 2017-10-17 DIAGNOSIS — I10 Essential (primary) hypertension: Secondary | ICD-10-CM | POA: Diagnosis not present

## 2017-10-17 DIAGNOSIS — K219 Gastro-esophageal reflux disease without esophagitis: Secondary | ICD-10-CM

## 2017-10-17 DIAGNOSIS — I509 Heart failure, unspecified: Secondary | ICD-10-CM | POA: Insufficient documentation

## 2017-10-17 DIAGNOSIS — G43C1 Periodic headache syndromes in child or adult, intractable: Secondary | ICD-10-CM | POA: Diagnosis not present

## 2017-10-17 DIAGNOSIS — J301 Allergic rhinitis due to pollen: Secondary | ICD-10-CM | POA: Diagnosis not present

## 2017-10-17 DIAGNOSIS — M79674 Pain in right toe(s): Secondary | ICD-10-CM

## 2017-10-17 DIAGNOSIS — R252 Cramp and spasm: Secondary | ICD-10-CM

## 2017-10-17 LAB — BASIC METABOLIC PANEL
BUN: 16 mg/dL (ref 6–23)
CALCIUM: 9.3 mg/dL (ref 8.4–10.5)
CHLORIDE: 105 meq/L (ref 96–112)
CO2: 30 mEq/L (ref 19–32)
CREATININE: 0.88 mg/dL (ref 0.40–1.20)
GFR: 82.66 mL/min (ref >60.00)
Glucose, Bld: 96 mg/dL (ref 70–99)
Potassium: 4.1 mEq/L (ref 3.5–5.1)
SODIUM: 142 meq/L (ref 135–145)

## 2017-10-17 NOTE — Assessment & Plan Note (Signed)
Continue Claritin prn 

## 2017-10-17 NOTE — Assessment & Plan Note (Signed)
Controlled on Pantoprazole Continue for now Discussed foods to avoid with GERD

## 2017-10-17 NOTE — Assessment & Plan Note (Signed)
Currently not an issue Will monitor 

## 2017-10-17 NOTE — Patient Instructions (Signed)
Food Choices for Gastroesophageal Reflux Disease, Adult When you have gastroesophageal reflux disease (GERD), the foods you eat and your eating habits are very important. Choosing the right foods can help ease your discomfort. What guidelines do I need to follow?  Choose fruits, vegetables, whole grains, and low-fat dairy products.  Choose low-fat meat, fish, and poultry.  Limit fats such as oils, salad dressings, butter, nuts, and avocado.  Keep a food diary. This helps you identify foods that cause symptoms.  Avoid foods that cause symptoms. These may be different for everyone.  Eat small meals often instead of 3 large meals a day.  Eat your meals slowly, in a place where you are relaxed.  Limit fried foods.  Cook foods using methods other than frying.  Avoid drinking alcohol.  Avoid drinking large amounts of liquids with your meals.  Avoid bending over or lying down until 2-3 hours after eating. What foods are not recommended? These are some foods and drinks that may make your symptoms worse: Vegetables  Tomatoes. Tomato juice. Tomato and spaghetti sauce. Chili peppers. Onion and garlic. Horseradish. Fruits  Oranges, grapefruit, and lemon (fruit and juice). Meats  High-fat meats, fish, and poultry. This includes hot dogs, ribs, ham, sausage, salami, and bacon. Dairy  Whole milk and chocolate milk. Sour cream. Cream. Butter. Ice cream. Cream cheese. Drinks  Coffee and tea. Bubbly (carbonated) drinks or energy drinks. Condiments  Hot sauce. Barbecue sauce. Sweets/Desserts  Chocolate and cocoa. Donuts. Peppermint and spearmint. Fats and Oils  High-fat foods. This includes French fries and potato chips. Other  Vinegar. Strong spices. This includes black pepper, white pepper, red pepper, cayenne, curry powder, cloves, ginger, and chili powder. The items listed above may not be a complete list of foods and drinks to avoid. Contact your dietitian for more information.    This information is not intended to replace advice given to you by your health care provider. Make sure you discuss any questions you have with your health care provider. Document Released: 03/07/2012 Document Revised: 02/12/2016 Document Reviewed: 07/11/2013 Elsevier Interactive Patient Education  2017 Elsevier Inc.  

## 2017-10-17 NOTE — Assessment & Plan Note (Signed)
I want to wean down on her Lisinopril, but will check BMET first Continue current meds for now May need potassium supplement

## 2017-10-17 NOTE — Progress Notes (Signed)
Subjective:    Patient ID: Madison Miller, female    DOB: 1950-10-19, 67 y.o.   MRN: [id number]  HPI  Pt presents to the clinic today to establish care and for management of the conditions listed below. She is transferring care from Dr. Dayton Martes.  GERD: She is not sure what is triggering this. She is taking Pantoprazole as prescribed. She denies breakthrough symptoms.   HTN: Her BP today is 110/72. She is taking Lasix, Lisinopril and Metoprolol as prescribed. ECG from 05/2017 reviewed.  CHF: Controlled on Metoprolol, Lasix and Lisinopril. She denies cough, shortness of breath or swelling in her legs. She does c/o muscle cramps in her legs. She is not on a potassium supplement. She follows with a cardiologist but can not remember his name.   Migraines: She has not had a migraine in > 6 months. She is not sure what triggers this. She can not remember the name of the medication because she has not taken   Seasonal Allergies: Worse in the spring. She takes Claritin OTC with good relief.   Flu: never Tetanus: never Pneumovax: never Prevnar: never Zostovax: never Shingrix: never Mammogram: > 5 years ago Pap Smear: > 5 years ago Bone Density: 04/2013 Colon Screening: 04/2010 Vision Screening: as needed Dentist: as needed  Review of Systems  Past Medical History:  Diagnosis Date  . Allergic rhinitis due to pollen   . Cardiac LV ejection fraction >40%   . Chest pain   . CHF (congestive heart failure) (HCC)   . Degenerative joint disease of cervical and lumbar spine   . Degenerative lumbar disc   . Gallstones   . GERD (gastroesophageal reflux disease)   . HTN (hypertension)   . Hyperparathyroidism   . Migraine, unspecified, with intractable migraine, so stated, without mention of status migrainosus   . Nephrolithiasis   . PAC (premature atrial contraction)   . Radiculopathy   . Right bundle branch block and left anterior fascicular block   . Spondylosis   . SVT  (supraventricular tachycardia) (HCC)     Current Outpatient Medications  Medication Sig Dispense Refill  . acetaminophen (TYLENOL) 500 MG tablet Take 500 mg by mouth 2 (two) times daily as needed for headache.    . cyclobenzaprine (FLEXERIL) 5 MG tablet Take 1 tablet (5 mg total) by mouth 3 (three) times daily as needed for muscle spasms (back pain). Caution of sedation 30 tablet 0  . furosemide (LASIX) 40 MG tablet Take 1 tablet (40 mg total) by mouth daily. 90 tablet 2  . lisinopril (PRINIVIL,ZESTRIL) 10 MG tablet Take 1 tablet (10 mg total) by mouth daily. 90 tablet 2  . metoprolol succinate (TOPROL-XL) 25 MG 24 hr tablet Take 0.5 tablets (12.5 mg total) by mouth daily. 90 tablet 2  . ondansetron (ZOFRAN ODT) 8 MG disintegrating tablet Take 1 tablet (8 mg total) by mouth every 8 (eight) hours as needed for nausea or vomiting. 10 tablet 0  . pantoprazole (PROTONIX) 40 MG tablet Take 1 tablet (40 mg total) by mouth 2 (two) times daily. (Patient taking differently: Take 40 mg by mouth 2 (two) times daily as needed (reflux). ) 60 tablet 1   No current facility-administered medications for this visit.     Allergies  Allergen Reactions  . Lactose Intolerance (Gi) Other (See Comments)    Pain,bloating    Family History  Problem Relation Age of Onset  . Other Mother        barin tumor  .  Stroke Father   . Colon cancer Neg Hx   . Rectal cancer Neg Hx   . Stomach cancer Neg Hx     Social History   Socioeconomic History  . Marital status: Married    Spouse name: Not on file  . Number of children: 6  . Years of education: Not on file  . Highest education level: Not on file  Social Needs  . Financial resource strain: Not on file  . Food insecurity - worry: Not on file  . Food insecurity - inability: Not on file  . Transportation needs - medical: Not on file  . Transportation needs - non-medical: Not on file  Occupational History  . Not on file  Tobacco Use  . Smoking status:  Never Smoker  . Smokeless tobacco: Never Used  Substance and Sexual Activity  . Alcohol use: No    Alcohol/week: 0.0 oz  . Drug use: No  . Sexual activity: No  Other Topics Concern  . Not on file  Social History Narrative  . Not on file     Constitutional: Denies fever, malaise, fatigue, headache or abrupt weight changes.  HEENT: Denies eye pain, eye redness, ear pain, ringing in the ears, wax buildup, runny nose, nasal congestion, bloody nose, or sore throat. Respiratory: Denies difficulty breathing, shortness of breath, cough or sputum production.   Cardiovascular: Denies chest pain, chest tightness, palpitations or swelling in the hands or feet.  Gastrointestinal: Denies abdominal pain, bloating, constipation, diarrhea or blood in the stool.  GU: Denies urgency, frequency, pain with urination, burning sensation, blood in urine, odor or discharge. Musculoskeletal: Pt reports muscle cramps in legs. Pt reports toe pain, right foot. Denies decrease in range of motion, difficulty with gait, or joint swelling.  Skin: Denies redness, rashes, lesions or ulcercations.  Neurological: Denies dizziness, difficulty with memory, difficulty with speech or problems with balance and coordination.  Psych: Denies anxiety, depression, SI/HI.  No other specific complaints in a complete review of systems (except as listed in HPI above).     Objective:   Physical Exam   BP 110/72 (BP Location: Right Arm, Patient Position: Sitting, Cuff Size: Normal)   Pulse 82   Temp 97.7 F (36.5 C) (Oral)   Wt 125 lb 12.8 oz (57.1 kg)   SpO2 93%   BMI 22.28 kg/m  Wt Readings from Last 3 Encounters:  10/17/17 125 lb 12.8 oz (57.1 kg)  09/30/17 125 lb 8 oz (56.9 kg)  03/08/17 129 lb (58.5 kg)    General: Appears older than her stated age, in NAD. Cardiovascular: Normal rate and rhythm. Slight murmur noted. No JVD or BLE edema. No carotid bruits noted. Pulmonary/Chest: Normal effort and positive vesicular  breath sounds. No respiratory distress. No wheezes, rales or ronchi noted.  Abdomen: Soft and nontender. Normal bowel sounds. No distention or masses noted.  Musculoskeletal: No pain with palpation of the calves. Arthritic joints noted in hands bilaterally. Bunion noted of right foot. Pressure area noted on 3rd toe right foot.  Neurological: Alert and oriented.  Psychiatric: Mood and affect normal. Behavior is normal. Judgment and thought content normal.     BMET    Component Value Date/Time   NA 137 06/17/2017 2306   K 3.1 (L) 06/17/2017 2306   CL 99 (L) 06/17/2017 2306   CO2 26 06/17/2017 2306   GLUCOSE 156 (H) 06/17/2017 2306   BUN 9 06/17/2017 2306   CREATININE 0.98 06/17/2017 2306   CALCIUM 9.2 06/17/2017 2306  CALCIUM 12.0 (H) 11/24/2007 1700   GFRNONAA 59 (L) 06/17/2017 2306   GFRAA >60 06/17/2017 2306    Lipid Panel     Component Value Date/Time   CHOL 179 03/08/2017 1120   TRIG 120.0 03/08/2017 1120   HDL 50.10 03/08/2017 1120   CHOLHDL 4 03/08/2017 1120   VLDL 24.0 03/08/2017 1120   LDLCALC 105 (H) 03/08/2017 1120    CBC    Component Value Date/Time   WBC 13.0 (H) 06/17/2017 2306   RBC 4.56 06/17/2017 2306   HGB 13.7 06/17/2017 2306   HCT 41.2 06/17/2017 2306   PLT 270 06/17/2017 2306   MCV 90.4 06/17/2017 2306   MCH 30.0 06/17/2017 2306   MCHC 33.3 06/17/2017 2306   RDW 13.4 06/17/2017 2306   LYMPHSABS 0.8 06/17/2017 2306   MONOABS 0.3 06/17/2017 2306   EOSABS 0.0 06/17/2017 2306   BASOSABS 0.0 06/17/2017 2306    Hgb A1C Lab Results  Component Value Date   HGBA1C  11/23/2007    5.0 (NOTE)   The ADA recommends the following therapeutic goals for glycemic   control related to Hgb A1C measurement:   Goal of Therapy:   < 7.0% Hgb A1C   Action Suggested:  > 8.0% Hgb A1C   Ref:  Diabetes Care, 22, Suppl. 1, 1999           Assessment & Plan:   Toe Pain, Right Foot:  Referral placed to podiatry for further evaluation and treatment  Muscle  Cramps in Legs:  Likely a diuretic induced hypokalemia BMET today May need a potassium supplement  RTC in 6 months for our Medicare Wellness Exam  Nicki ReaperBAITY, Reha Martinovich, NP

## 2017-10-17 NOTE — Assessment & Plan Note (Signed)
C/o muscle cramps, BMET today May need potassium supplement Continue Lisinopril, Metoprolol and Lasix for now She will continue to follow with cardiology

## 2017-10-18 ENCOUNTER — Other Ambulatory Visit: Payer: Self-pay | Admitting: Internal Medicine

## 2017-10-18 MED ORDER — LISINOPRIL 5 MG PO TABS: 5.0000 mg | ORAL_TABLET | Freq: Every day | ORAL | 2 refills | Status: DC

## 2017-10-28 ENCOUNTER — Other Ambulatory Visit: Payer: Self-pay | Admitting: *Deleted

## 2017-10-28 ENCOUNTER — Other Ambulatory Visit: Payer: Self-pay | Admitting: Internal Medicine

## 2017-10-28 MED ORDER — FUROSEMIDE 40 MG PO TABS: 40.0000 mg | ORAL_TABLET | Freq: Every day | ORAL | 2 refills | Status: DC

## 2017-10-28 NOTE — Telephone Encounter (Signed)
Copied from CRM [phone]. Topic: Quick Communication - Rx Refill/Question >> Oct 28, 2017 12:02 PM Guinevere FerrariMorris, Tai Skelly E, NT wrote: Medication: furosemide (LASIX) 40 MG tablet   Has the patient contacted their pharmacy? Yes   (Agent: If no, request that the patient contact the pharmacy for the refill.)  [id number] [place] [place] - [address] [phone] (Phone) [phone] (Fax)   Preferred Pharmacy (with phone number or street name):    Agent: Please be advised that RX refills may take up to 3 business days. We ask that you follow-up with your pharmacy.

## 2017-11-01 ENCOUNTER — Ambulatory Visit (INDEPENDENT_AMBULATORY_CARE_PROVIDER_SITE_OTHER): Payer: Medicare Other | Admitting: Podiatry

## 2017-11-01 ENCOUNTER — Encounter: Payer: Self-pay | Admitting: Podiatry

## 2017-11-01 ENCOUNTER — Ambulatory Visit: Payer: Medicare Other

## 2017-11-01 VITALS — BP 116/77 | HR 66 | Resp 16

## 2017-11-01 DIAGNOSIS — L97511 Non-pressure chronic ulcer of other part of right foot limited to breakdown of skin: Secondary | ICD-10-CM | POA: Diagnosis not present

## 2017-11-01 DIAGNOSIS — M21619 Bunion of unspecified foot: Secondary | ICD-10-CM

## 2017-11-01 MED ORDER — MUPIROCIN 2 % EX OINT: 1.0000 "application " | TOPICAL_OINTMENT | Freq: Two times a day (BID) | CUTANEOUS | 0 refills | Status: DC

## 2017-11-01 NOTE — Progress Notes (Signed)
   Subjective:    Patient ID: Linnell FullingCarrie E Kerkhoff, female    DOB: Feb 07, 1951, 67 y.o.   MRN: [id number]  HPI Chief Complaint  Patient presents with  . Toe Pain    4th toe right - medial side, raw, macerated skin, darkened, x several weeks, tried "toe cream"-no help      Review of Systems  Musculoskeletal: Positive for arthralgias, gait problem and myalgias.  All other systems reviewed and are negative.      Objective:   Physical Exam        Assessment & Plan:

## 2017-11-02 NOTE — Progress Notes (Signed)
   HPI: 67 year old female presenting as a new patient with a chief complaint of pain and tenderness to the medial side of the right fourth toe that began several weeks ago. She reports the area is raw and macerated. The pain increases when her third toe rubs against it. She has been applying "toe cream" to the area with no significant relief. Patient is here for further evaluation and treatment.   Past Medical History:  Diagnosis Date  . CHF (congestive heart failure) (HCC)   . GERD (gastroesophageal reflux disease)   . HTN (hypertension)   . Hyperparathyroidism   . Migraine, unspecified, with intractable migraine, so stated, without mention of status migrainosus   . Right bundle branch block and left anterior fascicular block   . SVT (supraventricular tachycardia) (HCC)      Physical Exam: General: The patient is alert and oriented x3 in no acute distress.  Dermatology: Superficial skin breakdown noted to the fourth toe of the right foot. Skin is warm, dry and supple bilateral lower extremities. Negative for open lesions or macerations.  Vascular: Palpable pedal pulses bilaterally. No edema or erythema noted. Capillary refill within normal limits.  Neurological: Epicritic and protective threshold grossly intact bilaterally.   Musculoskeletal Exam: Range of motion within normal limits to all pedal and ankle joints bilateral. Muscle strength 5/5 in all groups bilateral.   Assessment: - Superficial skin breakdown right fourth toe   Plan of Care:  - Patient evaluated.  - Prescription for Mupirocin cream provided to patient to be used daily with a Band-Aid. - Recommended good shoe gear. - Return to clinic as needed.    Felecia ShellingBrent M. Evans, DPM [facility]  Dr. Felecia ShellingBrent M. Evans, DPM    [address]                                        [place], [place] [address]                Office [phone]  Fax [phone]

## 2017-11-28 ENCOUNTER — Other Ambulatory Visit: Payer: Self-pay | Admitting: Internal Medicine

## 2017-11-28 MED ORDER — PANTOPRAZOLE SODIUM 40 MG PO TBEC: 40.0000 mg | DELAYED_RELEASE_TABLET | Freq: Two times a day (BID) | ORAL | 2 refills | Status: DC | PRN

## 2017-11-29 ENCOUNTER — Telehealth: Payer: Self-pay | Admitting: *Deleted

## 2017-11-29 NOTE — Telephone Encounter (Signed)
Copied from CRM [id number]. Topic: General - Other >> Nov 29, 2017 12:21 PM Elliot GaultBell, Tiffany M wrote: Relation to pt: self  Call back number: [phone] [facility]acy: [id number]- [place], [place] - [address] [phone] (Phone) [phone])    Reason for call:  Patient spoke with pharmacy regarding pantoprazole (PROTONIX) 40 MG tablet, pharmacy denied receiving Rx, patient requesting Rx re sent, please advise >> Nov 29, 2017 12:25 PM Elliot GaultBell, Tiffany M wrote: Relation to pt: self  Call back number: [phone] Pharmacy: [id number]- [place], [place] - [address] [phone] (Phone) [phone])    Reason for call:  Patient spoke with pharmacy regarding pantoprazole (PROTONIX) 40 MG tablet, pharmacy denied receiving Rx, patient requesting Rx re sent, please advise

## 2017-11-29 NOTE — Telephone Encounter (Signed)
Called the pharmacy and they state pt picked up Rx today

## 2017-11-29 NOTE — Telephone Encounter (Signed)
Mel,  See below. Can you check on this or just resend?  Medication Detail    Disp Refills Start End   pantoprazole (PROTONIX) 40 MG tablet 60 tablet 2 11/28/2017    Sig - Route: Take 1 tablet (40 mg total) by mouth 2 (two) times daily as needed (reflux). - Oral   Sent to pharmacy as: pantoprazole (PROTONIX) 40 MG tablet   E-Prescribing Status: Receipt confirmed by pharmacy (11/28/2017 12:20 PM EDT)

## 2017-11-30 ENCOUNTER — Other Ambulatory Visit: Payer: Self-pay

## 2017-11-30 MED ORDER — LISINOPRIL 5 MG PO TABS: 5.0000 mg | ORAL_TABLET | Freq: Every day | ORAL | 1 refills | Status: DC

## 2018-02-19 ENCOUNTER — Other Ambulatory Visit: Payer: Self-pay | Admitting: Internal Medicine

## 2018-05-01 ENCOUNTER — Other Ambulatory Visit: Payer: Self-pay

## 2018-05-01 MED ORDER — MUPIROCIN 2 % EX OINT: 1.0000 "application " | TOPICAL_OINTMENT | Freq: Two times a day (BID) | CUTANEOUS | 0 refills | Status: DC

## 2018-05-01 NOTE — Telephone Encounter (Signed)
Pharmacy refill request for Mupriocin 2% ointment.  Per Dr  Logan BoresEvans verbal order, ok to refill this time, but needs follow up visit.   Script has been sent to pharmacy

## 2018-07-12 ENCOUNTER — Other Ambulatory Visit: Payer: Self-pay | Admitting: Internal Medicine

## 2018-07-18 ENCOUNTER — Telehealth: Payer: Self-pay | Admitting: Internal Medicine

## 2018-07-18 NOTE — Telephone Encounter (Signed)
Pt dropped off parking placard form to be filled out.Placed in RX tower °

## 2018-07-20 ENCOUNTER — Other Ambulatory Visit: Payer: Self-pay

## 2018-07-20 MED ORDER — METOPROLOL SUCCINATE ER 25 MG PO TB24: 12.5000 mg | ORAL_TABLET | Freq: Every day | ORAL | 0 refills | Status: DC

## 2018-07-20 NOTE — Telephone Encounter (Signed)
Madison Miller pharmacist at [facility] requesting refill metoprolol succinate 25 mg 24 hr tab. Pt established care 10/17/17 and pt was to return in 6 mths. Pt does not have appt scheduled. Refilled # 15 with note pt needs to call for appt. [name] voiced understanding and nothing further needed.

## 2018-08-12 ENCOUNTER — Other Ambulatory Visit: Payer: Self-pay | Admitting: Internal Medicine

## 2018-08-24 ENCOUNTER — Telehealth: Payer: Self-pay

## 2018-08-24 NOTE — Telephone Encounter (Signed)
[facility] faxed a note pt request cb about paperwork dropped off status. See 07/18/18 phone note when pt dropped off parking placard form to be filled out. Do not see under media tab where form has been scanned in. Pt request cb by end of day.

## 2018-08-29 NOTE — Telephone Encounter (Signed)
[facility] does not qualify for a handicap placard. She states she would appreciate a call back to notify her, within a better timeframe as she has had to call multiple times regarding this matter. Apologized, that she had not received call.

## 2018-09-05 ENCOUNTER — Ambulatory Visit (INDEPENDENT_AMBULATORY_CARE_PROVIDER_SITE_OTHER): Payer: Medicare Other | Admitting: Internal Medicine

## 2018-09-05 ENCOUNTER — Encounter: Payer: Self-pay | Admitting: Internal Medicine

## 2018-09-05 ENCOUNTER — Ambulatory Visit (INDEPENDENT_AMBULATORY_CARE_PROVIDER_SITE_OTHER)
Admission: RE | Admit: 2018-09-05 | Discharge: 2018-09-05 | Disposition: A | Discharge status: Home / Self Care | Payer: Medicare Other | Source: Ambulatory Visit | Attending: Internal Medicine | Admitting: Internal Medicine

## 2018-09-05 VITALS — BP 118/78 | HR 58 | Temp 97.7°F | Wt 119.0 lb

## 2018-09-05 DIAGNOSIS — M25512 Pain in left shoulder: Secondary | ICD-10-CM | POA: Diagnosis not present

## 2018-09-05 MED ORDER — PREDNISONE 10 MG PO TABS: ORAL_TABLET | ORAL | 0 refills | Status: DC

## 2018-09-05 NOTE — Patient Instructions (Signed)
Shoulder Exercises Ask your health care provider which exercises are safe for you. Do exercises exactly as told by your health care provider and adjust them as directed. It is normal to feel mild stretching, pulling, tightness, or discomfort as you do these exercises, but you should stop right away if you feel sudden pain or your pain gets worse.Do not begin these exercises until told by your health care provider. RANGE OF MOTION EXERCISES These exercises warm up your muscles and joints and improve the movement and flexibility of your shoulder. These exercises also help to relieve pain, numbness, and tingling. These exercises involve stretching your injured shoulder directly. Exercise A: Pendulum  1. Stand near a wall or a surface that you can hold onto for balance. 2. Bend at the waist and let your left / right arm hang straight down. Use your other arm to support you. Keep your back straight and do not lock your knees. 3. Relax your left / right arm and shoulder muscles, and move your hips and your trunk so your left / right arm swings freely. Your arm should swing because of the motion of your body, not because you are using your arm or shoulder muscles. 4. Keep moving your body so your arm swings in the following directions, as told by your health care provider: ? Side to side. ? Forward and backward. ? In clockwise and counterclockwise circles. 5. Continue each motion for __________ seconds, or for as long as told by your health care provider. 6. Slowly return to the starting position. Repeat __________ times. Complete this exercise __________ times a day. Exercise B:Flexion, Standing  1. Stand and hold a broomstick, a cane, or a similar object. Place your hands a little more than shoulder-width apart on the object. Your left / right hand should be palm-up, and your other hand should be palm-down. 2. Keep your elbow straight and keep your shoulder muscles relaxed. Push the stick down with  your healthy arm to raise your left / right arm in front of your body, and then over your head until you feel a stretch in your shoulder. ? Avoid shrugging your shoulder while you raise your arm. Keep your shoulder blade tucked down toward the middle of your back. 3. Hold for __________ seconds. 4. Slowly return to the starting position. Repeat __________ times. Complete this exercise __________ times a day. Exercise C: Abduction, Standing 1. Stand and hold a broomstick, a cane, or a similar object. Place your hands a little more than shoulder-width apart on the object. Your left / right hand should be palm-up, and your other hand should be palm-down. 2. While keeping your elbow straight and your shoulder muscles relaxed, push the stick across your body toward your left / right side. Raise your left / right arm to the side of your body and then over your head until you feel a stretch in your shoulder. ? Do not raise your arm above shoulder height, unless your health care provider tells you to do that. ? Avoid shrugging your shoulder while you raise your arm. Keep your shoulder blade tucked down toward the middle of your back. 3. Hold for __________ seconds. 4. Slowly return to the starting position. Repeat __________ times. Complete this exercise __________ times a day. Exercise D:Internal Rotation  1. Place your left / right hand behind your back, palm-up. 2. Use your other hand to dangle an exercise band, a towel, or a similar object over your shoulder. Grasp the band with   your left / right hand so you are holding onto both ends. 3. Gently pull up on the band until you feel a stretch in the front of your left / right shoulder. ? Avoid shrugging your shoulder while you raise your arm. Keep your shoulder blade tucked down toward the middle of your back. 4. Hold for __________ seconds. 5. Release the stretch by letting go of the band and lowering your hands. Repeat __________ times. Complete  this exercise __________ times a day. STRETCHING EXERCISES These exercises warm up your muscles and joints and improve the movement and flexibility of your shoulder. These exercises also help to relieve pain, numbness, and tingling. These exercises are done using your healthy shoulder to help stretch the muscles of your injured shoulder. Exercise E: Corner Stretch (External Rotation and Abduction)  1. Stand in a doorway with one of your feet slightly in front of the other. This is called a staggered stance. If you cannot reach your forearms to the door frame, stand facing a corner of a room. 2. Choose one of the following positions as told by your health care provider: ? Place your hands and forearms on the door frame above your head. ? Place your hands and forearms on the door frame at the height of your head. ? Place your hands on the door frame at the height of your elbows. 3. Slowly move your weight onto your front foot until you feel a stretch across your chest and in the front of your shoulders. Keep your head and chest upright and keep your abdominal muscles tight. 4. Hold for __________ seconds. 5. To release the stretch, shift your weight to your back foot. Repeat __________ times. Complete this stretch __________ times a day. Exercise F:Extension, Standing 1. Stand and hold a broomstick, a cane, or a similar object behind your back. ? Your hands should be a little wider than shoulder-width apart. ? Your palms should face away from your back. 2. Keeping your elbows straight and keeping your shoulder muscles relaxed, move the stick away from your body until you feel a stretch in your shoulder. ? Avoid shrugging your shoulders while you move the stick. Keep your shoulder blade tucked down toward the middle of your back. 3. Hold for __________ seconds. 4. Slowly return to the starting position. Repeat __________ times. Complete this exercise __________ times a day. STRENGTHENING  EXERCISES These exercises build strength and endurance in your shoulder. Endurance is the ability to use your muscles for a long time, even after they get tired. Exercise G:External Rotation  1. Sit in a stable chair without armrests. 2. Secure an exercise band at elbow height on your left / right side. 3. Place a soft object, such as a folded towel or a small pillow, between your left / right upper arm and your body to move your elbow a few inches away (about 10 cm) from your side. 4. Hold the end of the band so it is tight and there is no slack. 5. Keeping your elbow pressed against the soft object, move your left / right forearm out, away from your abdomen. Keep your body steady so only your forearm moves. 6. Hold for __________ seconds. 7. Slowly return to the starting position. Repeat __________ times. Complete this exercise __________ times a day. Exercise H:Shoulder Abduction  1. Sit in a stable chair without armrests, or stand. 2. Hold a __________ weight in your left / right hand, or hold an exercise band with both hands.   3. Start with your arms straight down and your left / right palm facing in, toward your body. 4. Slowly lift your left / right hand out to your side. Do not lift your hand above shoulder height unless your health care provider tells you that this is safe. ? Keep your arms straight. ? Avoid shrugging your shoulder while you do this movement. Keep your shoulder blade tucked down toward the middle of your back. 5. Hold for __________ seconds. 6. Slowly lower your arm, and return to the starting position. Repeat __________ times. Complete this exercise __________ times a day. Exercise I:Shoulder Extension 1. Sit in a stable chair without armrests, or stand. 2. Secure an exercise band to a stable object in front of you where it is at shoulder height. 3. Hold one end of the exercise band in each hand. Your palms should face each other. 4. Straighten your elbows and  lift your hands up to shoulder height. 5. Step back, away from the secured end of the exercise band, until the band is tight and there is no slack. 6. Squeeze your shoulder blades together as you pull your hands down to the sides of your thighs. Stop when your hands are straight down by your sides. Do not let your hands go behind your body. 7. Hold for __________ seconds. 8. Slowly return to the starting position. Repeat __________ times. Complete this exercise __________ times a day. Exercise J:Standing Shoulder Row 1. Sit in a stable chair without armrests, or stand. 2. Secure an exercise band to a stable object in front of you so it is at waist height. 3. Hold one end of the exercise band in each hand. Your palms should be in a thumbs-up position. 4. Bend each of your elbows to an "L" shape (about 90 degrees) and keep your upper arms at your sides. 5. Step back until the band is tight and there is no slack. 6. Slowly pull your elbows back behind you. 7. Hold for __________ seconds. 8. Slowly return to the starting position. Repeat __________ times. Complete this exercise __________ times a day. Exercise K:Shoulder Press-Ups  1. Sit in a stable chair that has armrests. Sit upright, with your feet flat on the floor. 2. Put your hands on the armrests so your elbows are bent and your fingers are pointing forward. Your hands should be about even with the sides of your body. 3. Push down on the armrests and use your arms to lift yourself off of the chair. Straighten your elbows and lift yourself up as much as you comfortably can. ? Move your shoulder blades down, and avoid letting your shoulders move up toward your ears. ? Keep your feet on the ground. As you get stronger, your feet should support less of your body weight as you lift yourself up. 4. Hold for __________ seconds. 5. Slowly lower yourself back into the chair. Repeat __________ times. Complete this exercise __________ times a  day. Exercise L: Wall Push-Ups  1. Stand so you are facing a stable wall. Your feet should be about one arm-length away from the wall. 2. Lean forward and place your palms on the wall at shoulder height. 3. Keep your feet flat on the floor as you bend your elbows and lean forward toward the wall. 4. Hold for __________ seconds. 5. Straighten your elbows to push yourself back to the starting position. Repeat __________ times. Complete this exercise __________ times a day. This information is not intended to replace advice   given to you by your health care provider. Make sure you discuss any questions you have with your health care provider. Document Released: 07/21/2005 Document Revised: 05/31/2016 Document Reviewed: 05/18/2015 Elsevier Interactive Patient Education  2018 [employer]vier Inc.  

## 2018-09-05 NOTE — Progress Notes (Signed)
Subjective:    Patient ID: Madison Miller, female    DOB: Jan 27, 1951, 68 y.o.   MRN: [id number]  HPI  Pt presents to the clinic today with c/o left shoulder pain. She reports this started 1 week ago. She describes the pain as sore and achy. The pain radiates into her left arm. She has difficulty lifting her arm above her head. She denies numbness, tingling or weakness. She denies any recently injury to the shoulder but reports possible injury years ago when she was working. She has tried Tylenol with minimal relief.  Review of Systems  Past Medical History:  Diagnosis Date  . CHF (congestive heart failure) (HCC)   . GERD (gastroesophageal reflux disease)   . HTN (hypertension)   . Hyperparathyroidism   . Migraine, unspecified, with intractable migraine, so stated, without mention of status migrainosus   . Right bundle branch block and left anterior fascicular block   . SVT (supraventricular tachycardia) (HCC)     Current Outpatient Medications  Medication Sig Dispense Refill  . acetaminophen (TYLENOL) 500 MG tablet Take 500 mg by mouth 2 (two) times daily as needed for headache.    . cyclobenzaprine (FLEXERIL) 5 MG tablet Take 1 tablet (5 mg total) by mouth 3 (three) times daily as needed for muscle spasms (back pain). Caution of sedation 30 tablet 0  . furosemide (LASIX) 40 MG tablet Take 1 tablet (40 mg total) by mouth daily. MUST SCHEDULE ANNUAL EXAM 90 tablet 0  . lisinopril (PRINIVIL,ZESTRIL) 5 MG tablet Take 1 tablet (5 mg total) by mouth daily. MUST SCHEDULE ANNUAL EXAM 90 tablet 0  . metoprolol succinate (TOPROL-XL) 25 MG 24 hr tablet Take 0.5 tablets (12.5 mg total) by mouth daily. MUST SCHEDULE ANNUAL EXAM 15 tablet 0  . mupirocin ointment (BACTROBAN) 2 % Apply 1 application topically 2 (two) times daily. 22 g 0  . ondansetron (ZOFRAN ODT) 8 MG disintegrating tablet Take 1 tablet (8 mg total) by mouth every 8 (eight) hours as needed for nausea or vomiting. 10 tablet 0    . pantoprazole (PROTONIX) 40 MG tablet TAKE 1 TABLET (40 MG TOTAL) BY MOUTH 2 (TWO) TIMES DAILY AS NEEDED (REFLUX). 60 tablet 1   No current facility-administered medications for this visit.     Allergies  Allergen Reactions  . Lactose Intolerance (Gi) Other (See Comments)    Pain,bloating    Family History  Problem Relation Age of Onset  . Other Mother        barin tumor  . Stroke Father   . Colon cancer Neg Hx   . Rectal cancer Neg Hx   . Stomach cancer Neg Hx     Social History   Socioeconomic History  . Marital status: Married    Spouse name: Not on file  . Number of children: 6  . Years of education: Not on file  . Highest education level: Not on file  Occupational History  . Not on file  Social Needs  . Financial resource strain: Not on file  . Food insecurity:    Worry: Not on file    Inability: Not on file  . Transportation needs:    Medical: Not on file    Non-medical: Not on file  Tobacco Use  . Smoking status: Never Smoker  . Smokeless tobacco: Never Used  Substance and Sexual Activity  . Alcohol use: No    Alcohol/week: 0.0 standard drinks  . Drug use: No  . Sexual activity: Never  Lifestyle  . Physical activity:    Days per week: Not on file    Minutes per session: Not on file  . Stress: Not on file  Relationships  . Social connections:    Talks on phone: Not on file    Gets together: Not on file    Attends religious service: Not on file    Active member of club or organization: Not on file    Attends meetings of clubs or organizations: Not on file    Relationship status: Not on file  . Intimate partner violence:    Fear of current or ex partner: Not on file    Emotionally abused: Not on file    Physically abused: Not on file    Forced sexual activity: Not on file  Other Topics Concern  . Not on file  Social History Narrative  . Not on file     Constitutional: Denies fever, malaise, fatigue, headache or abrupt weight changes.   Respiratory: Denies difficulty breathing, shortness of breath, cough or sputum production.   Cardiovascular: Denies chest pain, chest tightness, palpitations or swelling in the hands or feet.  Musculoskeletal: Pt reports left shoulder pain. Denies difficulty with gait, muscle pain or joint swelling.  Skin: Denies redness, rashes, lesions or ulcercations.  Neurological: Denies numbness, tingling, weakness or problems with coordination.   No other specific complaints in a complete review of systems (except as listed in HPI above).     Objective:   Physical Exam  BP 118/78   Pulse (!) 58   Temp 97.7 F (36.5 C) (Oral)   Wt 119 lb (54 kg)   SpO2 97%   BMI 21.08 kg/m  Wt Readings from Last 3 Encounters:  09/05/18 119 lb (54 kg)  10/17/17 125 lb 12.8 oz (57.1 kg)  09/30/17 125 lb 8 oz (56.9 kg)    General: Appears her stated age, well developed, well nourished in NAD. Cardiovascular: Radial pulse 2+ bilaterally. Musculoskeletal: Decreased external rotation of the left shoulder. Normal internal rotation. Pain with palpation over the left anterior biceps tendon. Strength 4/5 LUE, 5/5 RUE. Hand grips equal.  Neurological: Alert and oriented. Sensation intact to BUE.  BMET    Component Value Date/Time   NA 142 10/17/2017 1058   K 4.1 10/17/2017 1058   CL 105 10/17/2017 1058   CO2 30 10/17/2017 1058   GLUCOSE 96 10/17/2017 1058   BUN 16 10/17/2017 1058   CREATININE 0.88 10/17/2017 1058   CALCIUM 9.3 10/17/2017 1058   CALCIUM 12.0 (H) 11/24/2007 1700   GFRNONAA 59 (L) 06/17/2017 2306   GFRAA >60 06/17/2017 2306    Lipid Panel     Component Value Date/Time   CHOL 179 03/08/2017 1120   TRIG 120.0 03/08/2017 1120   HDL 50.10 03/08/2017 1120   CHOLHDL 4 03/08/2017 1120   VLDL 24.0 03/08/2017 1120   LDLCALC 105 (H) 03/08/2017 1120    CBC    Component Value Date/Time   WBC 13.0 (H) 06/17/2017 2306   RBC 4.56 06/17/2017 2306   HGB 13.7 06/17/2017 2306   HCT 41.2  06/17/2017 2306   PLT 270 06/17/2017 2306   MCV 90.4 06/17/2017 2306   MCH 30.0 06/17/2017 2306   MCHC 33.3 06/17/2017 2306   RDW 13.4 06/17/2017 2306   LYMPHSABS 0.8 06/17/2017 2306   MONOABS 0.3 06/17/2017 2306   EOSABS 0.0 06/17/2017 2306   BASOSABS 0.0 06/17/2017 2306    Hgb A1C Lab Results  Component Value Date  HGBA1C  11/23/2007    5.0 (NOTE)   The ADA recommends the following therapeutic goals for glycemic   control related to Hgb A1C measurement:   Goal of Therapy:   < 7.0% Hgb A1C   Action Suggested:  > 8.0% Hgb A1C   Ref:  Diabetes Care, 22, Suppl. 1, 1999            Assessment & Plan:   Left Shoulder Pain:  ? Biceps tendonitis Xray left shoulder for further evaluation RX for Pred Taper x 9 days- avoid OTC NSAID's Can still take Tylenol if needed for pain Shoulder exercises given Ice for 10 minutes 2 x day  Will follow up after xray, return precautions discussed Nicki Reaperegina Trista Ciocca, NP

## 2018-09-06 ENCOUNTER — Encounter: Payer: Self-pay | Admitting: Internal Medicine

## 2018-09-06 ENCOUNTER — Telehealth: Payer: Self-pay

## 2018-09-06 ENCOUNTER — Ambulatory Visit (INDEPENDENT_AMBULATORY_CARE_PROVIDER_SITE_OTHER): Payer: Medicare Other | Admitting: Internal Medicine

## 2018-09-06 VITALS — BP 112/76 | HR 64 | Temp 97.8°F | Wt 119.0 lb

## 2018-09-06 DIAGNOSIS — J029 Acute pharyngitis, unspecified: Secondary | ICD-10-CM | POA: Diagnosis not present

## 2018-09-06 DIAGNOSIS — R059 Cough, unspecified: Secondary | ICD-10-CM

## 2018-09-06 DIAGNOSIS — R05 Cough: Secondary | ICD-10-CM | POA: Diagnosis not present

## 2018-09-06 DIAGNOSIS — R51 Headache: Secondary | ICD-10-CM

## 2018-09-06 DIAGNOSIS — R519 Headache, unspecified: Secondary | ICD-10-CM

## 2018-09-06 LAB — POC INFLUENZA A&B (BINAX/QUICKVUE)
Influenza A, POC: NEGATIVE
Influenza B, POC: NEGATIVE

## 2018-09-06 NOTE — Addendum Note (Signed)
Addended by: Roena MaladyEVONTENNO, Kashish Yglesias Y on: 09/06/2018 05:03 PM   Modules accepted: Orders

## 2018-09-06 NOTE — Patient Instructions (Signed)

## 2018-09-06 NOTE — Telephone Encounter (Signed)
pts daughter is going to have a baby and pt does not know if she has ever had a Tdap. Pt then said she feels really bad, H/A,S/T, non prod cough and ? Fever. Pt scheduled appt to see R Baity NP 09/06/18 at 3 pm. Pt was seen yesterday and forgot to ask about whooping cough shot and wants note sent to Pamala Hurry Baity NP so if she needs shot she can get it today. FYI to R BaityNP.

## 2018-09-06 NOTE — Telephone Encounter (Signed)
Will discuss at office visit.

## 2018-09-06 NOTE — Progress Notes (Signed)
HPI  Pt presents to the clinic today with c/o headache, sore throat and cough. She reports this started this morning. The headache is located in her forehead. She describes the pain as sore and achy. She denies dizziness, visual changes, etc. The throat is scratchy, but she denies difficulty swallowing. The cough is non productive. She denies runny nose, nasal congestion, ear pain or shortness of breath. She denies fever, chills or body aches. She has not taken anything OTC. She has not had sick contacts.  Review of Systems      Past Medical History:  Diagnosis Date  . CHF (congestive heart failure) (HCC)   . GERD (gastroesophageal reflux disease)   . HTN (hypertension)   . Hyperparathyroidism   . Migraine, unspecified, with intractable migraine, so stated, without mention of status migrainosus   . Right bundle branch block and left anterior fascicular block   . SVT (supraventricular tachycardia) (HCC)     Family History  Problem Relation Age of Onset  . Other Mother        barin tumor  . Stroke Father   . Colon cancer Neg Hx   . Rectal cancer Neg Hx   . Stomach cancer Neg Hx     Social History   Socioeconomic History  . Marital status: Married    Spouse name: Not on file  . Number of children: 6  . Years of education: Not on file  . Highest education level: Not on file  Occupational History  . Not on file  Social Needs  . Financial resource strain: Not on file  . Food insecurity:    Worry: Not on file    Inability: Not on file  . Transportation needs:    Medical: Not on file    Non-medical: Not on file  Tobacco Use  . Smoking status: Never Smoker  . Smokeless tobacco: Never Used  Substance and Sexual Activity  . Alcohol use: No    Alcohol/week: 0.0 standard drinks  . Drug use: No  . Sexual activity: Never  Lifestyle  . Physical activity:    Days per week: Not on file    Minutes per session: Not on file  . Stress: Not on file  Relationships  . Social  connections:    Talks on phone: Not on file    Gets together: Not on file    Attends religious service: Not on file    Active member of club or organization: Not on file    Attends meetings of clubs or organizations: Not on file    Relationship status: Not on file  . Intimate partner violence:    Fear of current or ex partner: Not on file    Emotionally abused: Not on file    Physically abused: Not on file    Forced sexual activity: Not on file  Other Topics Concern  . Not on file  Social History Narrative  . Not on file    Allergies  Allergen Reactions  . Lactose Intolerance (Gi) Other (See Comments)    Pain,bloating     Constitutional: Positive headache, fatigue. Denies fever or abrupt weight changes.  HEENT:  Positive sore throat. Denies eye redness, eye pain, pressure behind the eyes, facial pain, nasal congestion, ear pain, ringing in the ears, wax buildup, runny nose or bloody nose. Respiratory: Positive cough. Denies difficulty breathing or shortness of breath.  Cardiovascular: Denies chest pain, chest tightness, palpitations or swelling in the hands or feet.   No other  specific complaints in a complete review of systems (except as listed in HPI above).  Objective:   BP 112/76   Pulse 64   Temp 97.8 F (36.6 C) (Oral)   Wt 119 lb (54 kg)   SpO2 97%   BMI 21.08 kg/m   Wt Readings from Last 3 Encounters:  09/05/18 119 lb (54 kg)  10/17/17 125 lb 12.8 oz (57.1 kg)  09/30/17 125 lb 8 oz (56.9 kg)     General: Appears her stated age, in NAD. HEENT: Head: normal shape and size, no sinus tenderness noted; Nose: mucosa boggy and moist, turbinates swollen; Throat/Mouth: + PND. Teeth present, mucosa erythematous and moist, no exudate noted, no lesions or ulcerations noted.  Neck: Bilateral anterior cervical lymphadenopathy.  Cardiovascular: Normal rate and rhythm.  Pulmonary/Chest: Normal effort and positive vesicular breath sounds. No respiratory distress. No  wheezes, rales or ronchi noted.       Assessment & Plan:   Headache, Sore Throat, Cough:  Rapid flu: negative Likely allergy/viral Get some rest and drink plenty of water Do salt water gargles for the sore throat Start Claritin and Flonase OTC Delsym as needed for cough  RTC as needed or if symptoms persist.   Nicki Reaperegina , NP

## 2018-09-11 ENCOUNTER — Telehealth: Payer: Self-pay | Admitting: *Deleted

## 2018-09-11 NOTE — Telephone Encounter (Signed)
She needs to continue Prednisone

## 2018-09-11 NOTE — Telephone Encounter (Signed)
Spoke to pt who states she has developed a sore throat since she started taking the prednisone, and is wanting to know should she continue it, or is she needing an alternate medication. pls advise

## 2018-09-12 NOTE — Telephone Encounter (Signed)
noted 

## 2018-09-12 NOTE — Telephone Encounter (Signed)
Spoke to pt who states she is not wanting to continue taking the prednisone and will take ibuprofen for her shoulder

## 2018-09-22 ENCOUNTER — Telehealth: Payer: Self-pay

## 2018-09-22 NOTE — Telephone Encounter (Signed)
Pt wanted to know dx of shoulder pain after xray on 09/05/18. Advised pt tendonitis. Pt said that was all she needed and ended call.

## 2018-10-12 ENCOUNTER — Other Ambulatory Visit: Payer: Self-pay | Admitting: Internal Medicine

## 2018-10-16 ENCOUNTER — Telehealth: Payer: Self-pay | Admitting: *Deleted

## 2018-10-16 NOTE — Telephone Encounter (Signed)
FYI Patient called to find out if she has had the whooping cough vaccine? Advised patient that we have no record of any vaccines for her and she verbalized understanding. Patient wanted to know if she has had it at  any hospital? Advised patient that we are part of Epic and if she has had it at any of the [facility] facilities it should be documented in this system.

## 2018-10-17 NOTE — Telephone Encounter (Signed)
She has not had tetanus vaccine and medicare will not pay for it as a preventative immunization.

## 2018-10-18 NOTE — Telephone Encounter (Signed)
Called pt, no answer and it said the mailbox was full, unable to leave message on VM  If pt is wanting to pay out of pocket it would be cheaper for her to go to the health dept as insurance will not cover the vaccine

## 2018-11-27 ENCOUNTER — Other Ambulatory Visit: Payer: Self-pay | Admitting: Internal Medicine

## 2018-12-26 ENCOUNTER — Other Ambulatory Visit: Payer: Self-pay

## 2018-12-26 MED ORDER — LISINOPRIL 5 MG PO TABS: 5.0000 mg | ORAL_TABLET | Freq: Every day | ORAL | 0 refills | Status: DC

## 2019-01-23 ENCOUNTER — Encounter: Payer: Medicare Other | Admitting: Internal Medicine

## 2019-01-23 ENCOUNTER — Other Ambulatory Visit: Payer: Self-pay | Admitting: Internal Medicine

## 2019-02-14 ENCOUNTER — Other Ambulatory Visit: Payer: Self-pay

## 2019-02-14 ENCOUNTER — Encounter (HOSPITAL_COMMUNITY): Payer: Self-pay

## 2019-02-14 ENCOUNTER — Telehealth: Payer: Self-pay

## 2019-02-14 ENCOUNTER — Other Ambulatory Visit: Payer: Self-pay | Admitting: Internal Medicine

## 2019-02-14 ENCOUNTER — Ambulatory Visit (INDEPENDENT_AMBULATORY_CARE_PROVIDER_SITE_OTHER): Payer: Medicare Other

## 2019-02-14 ENCOUNTER — Ambulatory Visit (HOSPITAL_COMMUNITY)
Admission: EM | Admit: 2019-02-14 | Discharge: 2019-02-14 | Disposition: A | Discharge status: Home / Self Care | Payer: Medicare Other | Attending: Family Medicine | Admitting: Family Medicine

## 2019-02-14 DIAGNOSIS — R079 Chest pain, unspecified: Secondary | ICD-10-CM

## 2019-02-14 DIAGNOSIS — M545 Low back pain, unspecified: Secondary | ICD-10-CM

## 2019-02-14 DIAGNOSIS — M79602 Pain in left arm: Secondary | ICD-10-CM

## 2019-02-14 MED ORDER — MELOXICAM 7.5 MG PO TABS: 7.5000 mg | ORAL_TABLET | Freq: Every day | ORAL | 0 refills | Status: DC

## 2019-02-14 NOTE — Discharge Instructions (Signed)
You have been seen at the [facility] today for chest pain. Your evaluation today was not suggestive of any emergent condition requiring medical intervention at this time. Your ECG (heart tracing) did not show any worrisome changes. However, some medical problems make take more time to appear. Therefore, it's very important that you pay attention to any new symptoms or worsening of your current condition.  Please proceed directly to the Emergency Department immediately should you feel worse in any way or have any of the following symptoms: increasing or different chest pain, pain that spreads to your arm, neck, jaw, back or abdomen, shortness of breath, or nausea and vomiting.  

## 2019-02-14 NOTE — ED Triage Notes (Signed)
Patient presents to Urgent Care with complaints of lower back pain, chronic but worse the last few days, and left sided chest pain radiating down her left arm since about a week. Patient reports her blood pressure has also been low, denies fever.

## 2019-02-14 NOTE — Telephone Encounter (Signed)
For 1 wk having consistent dull pain in lt side of chest that radiates to lt arm and sometimes to lt lower back. Pain level now is 7.prod cough with yellow phlegm,scratchy throat,muscle pain in lt arm. No SOB,N&Vand sweats; no fever today 97.3,chills,SOB,diarrhea,no loss of taste or smell. No travel and no known exposure to covid or flu. Pt has someone that can take her to [facility] now. FYI to Pamala Hurry NP.

## 2019-02-14 NOTE — Telephone Encounter (Signed)
This is a duplicate message. Will address ER note once it is completed.

## 2019-02-14 NOTE — ED Provider Notes (Signed)
[facility]   [id number] 02/14/19 Arrival Time: 0946  ASSESSMENT & PLAN:  1. Chest pain in adult    ECG: Performed today and interpreted by me: sinus bradycardia; RBBB; LAFB; no STEMI; no significant changes when compared to ECG dated 06/09/2017.  I have personally viewed the imaging studies ordered this visit. No acute changes. No pneumothorax.  Meds ordered this encounter  Medications  . meloxicam (MOBIC) 7.5 MG tablet    Sig: Take 1 tablet (7.5 mg total) by mouth daily.    Dispense:  10 tablet    Refill:  0   Observation. Chest pain precautions given. ED with any worsening. Reviewed expectations re: course of current medical issues. Questions answered. Outlined signs and symptoms indicating need for more acute intervention. Patient verbalized understanding. After Visit Summary given.   SUBJECTIVE:  History from: patient. Madison Miller is a 68 y.o. female with PMH significant for CHF and HTN who presents with complaint of fairly persistent left-sided chest discomfort described as dull that she thinks occasionally radiates to her left lower back and her left arm. Onset gradual, over the past week. Reports symptoms are overall stable "but I think I'm feeling a little worse" since symptoms started. Also reports coughing over the past several days; mildly productive. No fever suspected. No chills. Injury or recent strenuous activity: "don't think so.". When present, rates as 7/10 in intensity when present; without associated n/v; without associated SOB. Normal PO intake. No LE edema. Denies: fatigue, irregular heart beat, near-syncope, orthopnea, palpitations, paroxysmal nocturnal dyspnea and syncope. Aggravating factors: have not been identified. Alleviating factors: have not been identified. Pain does not wake her at night. Recent illnesses: none. Ambulatory without assistance. Self/OTC treatment: none reported.  Illicit drug use: none.  Social History    Tobacco Use  Smoking Status Never Smoker  Smokeless Tobacco Never Used   Social History   Substance and Sexual Activity  Alcohol Use No  . Alcohol/week: 0.0 standard drinks   ROS: As per HPI. All other systems negative.   OBJECTIVE:  Vitals:   02/14/19 1008  BP: 127/85  Pulse: 60  Resp: 18  Temp: 98.4 F (36.9 C)  TempSrc: Oral  SpO2: 100%    General appearance: alert, oriented, no acute distress Eyes: PERRLA; EOMI; conjunctivae normal HENT: normocephalic; atraumatic Neck: supple with FROM Lungs: without labored respirations; CTAB Heart: regular rate and rhythm without murmer Chest Wall: without tenderness to palpation Abdomen: soft, non-tender; bowel sounds normal; no masses or organomegaly; no guarding or rebound tenderness Extremities: without edema; without calf swelling or tenderness; symmetrical without gross deformities Skin: warm and dry; without rash or lesions Psychological: alert and cooperative; normal mood and affect  Imaging: Dg Chest 2 View  Result Date: 02/14/2019 CLINICAL DATA:  Chest pain EXAM: CHEST - 2 VIEW COMPARISON:  08/11/2016 FINDINGS: The heart size and mediastinal contours are within normal limits. Both lungs are clear. The visualized skeletal structures are unremarkable. IMPRESSION: No active cardiopulmonary disease. Electronically Signed   By: Marlan Palauharles  Clark M.D.   On: 02/14/2019 10:45    Allergies  Allergen Reactions  . Lactose Intolerance (Gi) Other (See Comments)    Pain,bloating    Past Medical History:  Diagnosis Date  . CHF (congestive heart failure) (HCC)   . GERD (gastroesophageal reflux disease)   . HTN (hypertension)   . Hyperparathyroidism   . Migraine, unspecified, with intractable migraine, so stated, without mention of status migrainosus   . Right bundle branch block and left  anterior fascicular block   . SVT (supraventricular tachycardia) (HCC)    Social History   Socioeconomic History  . Marital status: Married     Spouse name: Not on file  . Number of children: 6  . Years of education: Not on file  . Highest education level: Not on file  Occupational History  . Not on file  Social Needs  . Financial resource strain: Not on file  . Food insecurity:    Worry: Not on file    Inability: Not on file  . Transportation needs:    Medical: Not on file    Non-medical: Not on file  Tobacco Use  . Smoking status: Never Smoker  . Smokeless tobacco: Never Used  Substance and Sexual Activity  . Alcohol use: No    Alcohol/week: 0.0 standard drinks  . Drug use: No  . Sexual activity: Never  Lifestyle  . Physical activity:    Days per week: Not on file    Minutes per session: Not on file  . Stress: Not on file  Relationships  . Social connections:    Talks on phone: Not on file    Gets together: Not on file    Attends religious service: Not on file    Active member of club or organization: Not on file    Attends meetings of clubs or organizations: Not on file    Relationship status: Not on file  . Intimate partner violence:    Fear of current or ex partner: Not on file    Emotionally abused: Not on file    Physically abused: Not on file    Forced sexual activity: Not on file  Other Topics Concern  . Not on file  Social History Narrative  . Not on file   Family History  Problem Relation Age of Onset  . Other Mother        barin tumor  . Stroke Father   . Colon cancer Neg Hx   . Rectal cancer Neg Hx   . Stomach cancer Neg Hx    Past Surgical History:  Procedure Laterality Date  . LUMBAR LAMINECTOMY  2010   Dr. Phoebe Perch  . PARATHYROIDECTOMY  2009     Mardella Layman, MD 02/20/19 1050

## 2019-02-14 NOTE — Telephone Encounter (Signed)
Noted, will review ED note once completed. 

## 2019-02-14 NOTE — Telephone Encounter (Signed)
Appears patient is being seen in the ER for chest pain this am.   FYI to PCP.

## 2019-02-14 NOTE — Telephone Encounter (Signed)
[employer] [facility] Night - Client Nonclinical Telephone Record [facility] Medical Call Center Client [facility] Night - Client Client Site [facility] [place] - Night Physician Nicki Reaper - NP Contact Type Call Who Is Calling Patient / Member / Family / Caregiver Caller Name [name] Caller Phone Number [phone] Patient Name Madison Miller Patient DOB 11/10/2050 Call Type Message Only Information Provided Reason for Call Request to Schedule Office Appointment Initial Comment Caller states she needs to make an appt. Additional Comment declines triage. Call Closed By: Rudi Rummage Transaction Date/Time: 02/14/2019 7:56:16 AM (ET)

## 2019-02-16 ENCOUNTER — Ambulatory Visit: Payer: Self-pay | Admitting: *Deleted

## 2019-02-16 NOTE — Telephone Encounter (Signed)
Zella Ball called from [facility] to triage this patient with low b/p readings. over the course of the week. She has been as low as 94/65 and today 103/59. She denies dizziness, chest pain or shortness of breath. She continues to take b/p medications as ordered. Per protocol, an appointment scheduled for in the morning. Pt voiced understanding that this is a virtual appointment. Advised to hydrate, decrease salt intake and continue to monitor her b/p. Also advised to call back for increase in symptoms including systolic less than 100 and experiencing dizziness. Pt voiced understanding. Routing to LB at [facility] for review.   Reason for Disposition . [1] Systolic BP 90-110 AND [2] taking blood pressure medications AND [3] NOT dizzy, lightheaded or weak  Answer Assessment - Initial Assessment Questions 1. BLOOD PRESSURE: "What is the blood pressure?" "Did you take at least two measurements 5 minutes apart?"     102/62 hr 66 2. ONSET: "When did you take your blood pressure?"     now 3. HOW: "How did you obtain the blood pressure?" (e.g., visiting nurse, automatic home BP monitor)     Automatic b/p monitor 4. HISTORY: "Do you have a history of low blood pressure?" "What is your blood pressure normally?"     no 5. MEDICATIONS: "Are you taking any medications for blood pressure?" If yes: "Have they been changed recently?"     yes 6. PULSE RATE: "Do you know what your pulse rate is?"      66 7. OTHER SYMPTOMS: "Have you been sick recently?" "Have you had a recent injury?"     no 8. PREGNANCY: "Is there any chance you are pregnant?" "When was your last menstrual period?"     no  Protocols used: LOW BLOOD PRESSURE-A-AH

## 2019-02-16 NOTE — Telephone Encounter (Signed)
yes

## 2019-02-17 ENCOUNTER — Encounter: Payer: Self-pay | Admitting: Family Medicine

## 2019-02-17 ENCOUNTER — Ambulatory Visit (INDEPENDENT_AMBULATORY_CARE_PROVIDER_SITE_OTHER): Payer: Medicare Other | Admitting: Family Medicine

## 2019-02-17 ENCOUNTER — Other Ambulatory Visit: Payer: Self-pay

## 2019-02-17 VITALS — BP 103/69 | HR 72

## 2019-02-17 DIAGNOSIS — I10 Essential (primary) hypertension: Secondary | ICD-10-CM

## 2019-02-17 DIAGNOSIS — R519 Headache, unspecified: Secondary | ICD-10-CM

## 2019-02-17 DIAGNOSIS — R51 Headache: Secondary | ICD-10-CM | POA: Diagnosis not present

## 2019-02-17 NOTE — Addendum Note (Signed)
Addended by: Sheliah Hatch on: 02/17/2019 10:32 AM   Modules accepted: Level of Service

## 2019-02-17 NOTE — Progress Notes (Signed)
I have discussed the procedure for the virtual visit with the patient who has given consent to proceed with assessment and treatment.   Pt advised that her BP has been running low for a few days. Denies Dizziness, SOB, does have HAs x 2 days.  Pt states that her normal BP is close to the 103/69 she got this morning.   Geannie Risen, CMA

## 2019-02-17 NOTE — Progress Notes (Signed)
Virtual Visit via Video   I connected with patient on 02/17/19 at 10:00 AM EDT by a video enabled telemedicine application and verified that I am speaking with the correct person using two identifiers.  Location patient: Home Location provider: [facility], Office Persons participating in the virtual visit: Patient, Provider, CMA (Jess B)  I discussed the limitations of evaluation and management by telemedicine and the availability of in person appointments. The patient expressed understanding and agreed to proceed.  Interactive audio and video telecommunications were attempted between this provider and patient, however failed, due to patient having technical difficulties OR patient did not have access to video capability.  We continued and completed visit with audio only.   Subjective:   HPI:   Low blood pressure- 115/75 and 103/69 today.  No CP since going to UC on 5/27.  No SOB.  Pt reports feeling 'not too bad.  Better than I have been feeling this week'.  HA- pt reports HA x1 week.  sxs are frontal.  HAs 'come and go', improves w/ medicine.  Described as an ache.  Currently asymptomatic.  No visual changes, dizziness.  ROS:   See pertinent positives and negatives per HPI.  Patient Active Problem List   Diagnosis Date Noted  . CHF (congestive heart failure) (HCC) 10/17/2017  . GERD (gastroesophageal reflux disease)   . HTN (hypertension) 06/11/2014  . ALLERGIC RHINITIS, SEASONAL 07/03/2008  . Intractable migraine 12/15/2006    Social History   Tobacco Use  . Smoking status: Never Smoker  . Smokeless tobacco: Never Used  Substance Use Topics  . Alcohol use: No    Alcohol/week: 0.0 standard drinks    Current Outpatient Medications:  .  acetaminophen (TYLENOL) 500 MG tablet, Take 500 mg by mouth 2 (two) times daily as needed for headache., Disp: , Rfl:  .  furosemide (LASIX) 40 MG tablet, TAKE 1 TABLET BY MOUTH EVERY DAY, Disp: 30 tablet, Rfl: 1 .   lisinopril (PRINIVIL,ZESTRIL) 5 MG tablet, Take 1 tablet (5 mg total) by mouth daily., Disp: 90 tablet, Rfl: 0 .  meloxicam (MOBIC) 7.5 MG tablet, Take 1 tablet (7.5 mg total) by mouth daily., Disp: 10 tablet, Rfl: 0 .  metoprolol succinate (TOPROL-XL) 25 MG 24 hr tablet, TAKE 1/2 TABLET BY MOUTH EVERY DAY, Disp: 15 tablet, Rfl: 1 .  mupirocin ointment (BACTROBAN) 2 %, Apply 1 application topically 2 (two) times daily., Disp: 22 g, Rfl: 0  Allergies  Allergen Reactions  . Lactose Intolerance (Gi) Other (See Comments)    Pain,bloating    Objective:   BP 103/69   Pulse 72  Pt is able to speak clearly, coherently without shortness of breath or increased work of breathing.  Thought process is linear.  Mood is appropriate.   Assessment and Plan:   HTN- chronic problem.  pts most recent BP readings are excellent and not hypotensive.  She reports feeling well and only scheduled today's appt b/c she was told to make a follow up by UC.  Instructed her to continue BP meds and f/u w/ Nicki Reaper.  Pt expressed understanding and is in agreement w/ plan.   HA- currently asymptomatic.  Pt's described sxs seem consistent w/ sinus HA but she is a very poor historian.  Since she has no sxs to currently address, no further work up needed.   Neena Rhymes, MD 02/17/2019  Time spent with the patient: 12 minutes, of which >50% was spent in obtaining information about symptoms, reviewing previous  labs, evaluations, and treatments, counseling about condition (please see the discussed topics above), and developing a plan to further investigate it; had a number of questions which I addressed.

## 2019-02-19 NOTE — Telephone Encounter (Signed)
Seen at [facility], note reviewed.

## 2019-02-27 ENCOUNTER — Other Ambulatory Visit: Payer: Self-pay

## 2019-02-27 ENCOUNTER — Ambulatory Visit (INDEPENDENT_AMBULATORY_CARE_PROVIDER_SITE_OTHER): Payer: Medicare Other | Admitting: Internal Medicine

## 2019-02-27 ENCOUNTER — Encounter: Payer: Self-pay | Admitting: Internal Medicine

## 2019-02-27 VITALS — BP 116/68 | HR 73 | Temp 98.1°F | Wt 121.0 lb

## 2019-02-27 DIAGNOSIS — M62838 Other muscle spasm: Secondary | ICD-10-CM | POA: Diagnosis not present

## 2019-02-27 DIAGNOSIS — R0789 Other chest pain: Secondary | ICD-10-CM

## 2019-02-27 DIAGNOSIS — M7522 Bicipital tendinitis, left shoulder: Secondary | ICD-10-CM | POA: Diagnosis not present

## 2019-02-27 DIAGNOSIS — M545 Low back pain: Secondary | ICD-10-CM | POA: Diagnosis not present

## 2019-02-27 DIAGNOSIS — G8929 Other chronic pain: Secondary | ICD-10-CM | POA: Diagnosis not present

## 2019-02-27 DIAGNOSIS — M79602 Pain in left arm: Secondary | ICD-10-CM | POA: Diagnosis not present

## 2019-02-27 LAB — BASIC METABOLIC PANEL
BUN: 19 mg/dL (ref 6–23)
CO2: 32 mEq/L (ref 19–32)
Calcium: 9.7 mg/dL (ref 8.4–10.5)
Chloride: 103 mEq/L (ref 96–112)
Creatinine, Ser: 1.03 mg/dL (ref 0.40–1.20)
GFR: 64.58 mL/min (ref >60.00)
Glucose, Bld: 88 mg/dL (ref 70–99)
Potassium: 3.8 mEq/L (ref 3.5–5.1)
Sodium: 141 mEq/L (ref 135–145)

## 2019-02-27 LAB — MAGNESIUM: Magnesium: 1.8 mg/dL (ref 1.5–2.5)

## 2019-02-27 MED ORDER — PREDNISONE 10 MG PO TABS: ORAL_TABLET | ORAL | 0 refills | Status: DC

## 2019-02-27 NOTE — Patient Instructions (Signed)
Biceps Tendon Tendinitis (Proximal) and Tenosynovitis  The proximal biceps tendon is a strong cord of tissue that connects the biceps muscle, on the front of the upper arm, to the shoulder blade. Tendinitis is inflammation of a tendon. Tenosynovitis is inflammation of the lining around the tendon (tendon sheath). These conditions often occur at the same time, and they can interfere with the ability to bend the elbow and turn the hand palm-up (supination). Proximal biceps tendon tendinitis and tenosynovitis are usually caused by overusing the shoulder joint and the biceps muscle. These conditions usually heal within 6 weeks. Proximal biceps tendon tendinitis may include a grade 1 or grade 2 strain of the tendon. A grade 1 strain is mild, and it involves a slight pull of the tendon without any stretching or noticeable tearing of the tendon. There is usually no loss of biceps muscle strength. A grade 2 strain is moderate, and it involves a small tear in the tendon. The tendon is stretched, and biceps strength is usually decreased. What are the causes? This condition may be caused by:  A sudden increase in frequency or intensity of activity that involves the shoulder and the biceps muscle.  Overuse of the biceps muscle. This can happen when you do the same movements over and over, such as: ? Supination. ? Forceful straightening (hyperextension) of the elbow. ? Bending the elbow.  A direct, forceful hit or injury (trauma) to the elbow. This is rare. What increases the risk? The following factors may make you more likely to develop this condition:  Playing contact sports.  Playing sports that involve throwing and overhead movements, including racket sports, gymnastics, weight lifting, or bodybuilding.  Doing physical labor.  Having poor strength and flexibility of the arm and shoulder. What are the signs or symptoms? Symptoms of this condition may include:  Pain and inflammation in the front of  the shoulder. Pain may get worse with movement, especially when you use resistance, as in weight lifting.  A feeling of warmth in the front of the shoulder.  Limited range of motion of the shoulder and the elbow.  A crackling sound (crepitation) when you move or touch the shoulder or the upper arm. In some cases, symptoms may return (recur) after treatment, and they may be long-lasting (chronic). How is this diagnosed? This condition is diagnosed based on your symptoms, your medical history, and a physical exam. You may have tests, including X-rays or MRIs. Your health care provider may test your range of motion by asking you to do arm movements. How is this treated? This condition is treated by resting and icing the injured area, and by doing physical therapy exercises. Depending on the severity of your condition, treatment may also include:  Medicines to help relieve pain and inflammation.  Ultrasound therapy. This is the application of sound waves to the injured area.  Injecting medicines (corticosteroids) into your tendon sheath.  Injecting medicines that numb the area (local anesthetics).  Surgery to remove the damaged part of the tendon and reattach the undamaged part of the tendon to the arm bone (humerus). This is usually only done if you have symptoms that do not get better with other treatment methods. Follow these instructions at home: Managing pain, stiffness, and swelling   If directed, put ice on the injured area: ? Put ice in a plastic bag. ? Place a towel between your skin and the bag. ? Leave the ice on for 20 minutes, 2-3 times a day.  Move your   fingers often to avoid stiffness and to lessen swelling.  Raise (elevate) the injured area above the level of your heart while you are sitting or lying down.  If directed, apply heat to the affected area before you exercise. Use the heat source that your health care provider recommends, such as a moist heat pack or a  heating pad. ? Place a towel between your skin and the heat source. ? Leave the heat on for 20-30 minutes. ? Remove the heat if your skin turns bright red. This is especially important if you are unable to feel pain, heat, or cold. You may have a greater risk of getting burned. Activity  Return to your normal activities as told by your health care provider. Ask your health care provider what activities are safe for you.  Do not lift anything that is heavier than 10 lb (4.5 kg) until your health care provider tells you that it is safe.  Avoid activities that cause pain or make your condition worse.  Do exercises as told by your health care provider. General instructions  Take over-the-counter and prescription medicines only as told by your health care provider.  Do not drive or operate heavy machinery while taking prescription pain medicines.  Keep all follow-up visits as told by your health care provider. This is important. How is this prevented?  Warm up and stretch before being active.  Cool down and stretch after being active.  Give your body time to rest between periods of activity.  Make sure any equipment that you use is fitted to you.  Be safe and responsible while being active to avoid falls.  Do at least 150 minutes of moderate-intensity aerobic exercise each week, such as brisk walking or water aerobics.  Maintain physical fitness, including: ? Strength. ? Flexibility. ? Cardiovascular fitness. ? Endurance. Contact a health care provider if:  You have symptoms that get worse or do not get better after 2 weeks of treatment.  You develop new symptoms. Get help right away if:  You develop severe pain. This information is not intended to replace advice given to you by your health care provider. Make sure you discuss any questions you have with your health care provider. Document Released: 09/06/2005 Document Revised: 06/16/2017 Document Reviewed: 08/15/2015  [employer] Interactive Patient Education  2019 [employer]  

## 2019-02-27 NOTE — Progress Notes (Signed)
Subjective:    Patient ID: Madison Miller, female    DOB: 04-12-1951, 68 y.o.   MRN: [id number]  HPI  Patient is here today for urgent care follow up.  She went to urgent care on 5/27 for chest pain and back pain.  ECG unremarkable.  Chest xray unremarkable.  No labs were drawn.  No imaging of her back was obtained.  Meloxicam was prescribed which she has been taking with some relief.  Since discharge, the chest soreness has resolved but reports she is experiencing left upper arm pain.  She describes the pain as a pulling sensation and soreness.  She is right handed, but sleeps on her left side. The pain is worse with movement. She occasionally has tingling in her bilateral hands but denies numbness or weakness. She denies any specific injury to her arm.   She also wants to discuss ongoing lower back pain and getting set up with new orthopedic doctor. She describes the pain as sore and achy. She has previously had a lumbar laminectomy in 2010 by Dr. Luiz Ochoa. She reports he is no longer in practice but she would like to see someone to follow up on this. She does not take any medication for this OTC.   She also complains of intermittent muscle spasms in her left lower back and bilateral legs. This occurs intermittently. She seems to notice it more at night. She describes the pain as sharp and stabbing. The pain does not radiate. She denies numbness, tingling or weakness of the BLE.  Review of Systems  Past Medical History:  Diagnosis Date  . CHF (congestive heart failure) ([place])   . GERD (gastroesophageal reflux disease)   . HTN (hypertension)   . Hyperparathyroidism   . Migraine, unspecified, with intractable migraine, so stated, without mention of status migrainosus   . Right bundle branch block and left anterior fascicular block   . SVT (supraventricular tachycardia) ([facility])     Current Outpatient Medications  Medication Sig Dispense Refill  . acetaminophen (TYLENOL) 500 MG tablet  Take 500 mg by mouth 2 (two) times daily as needed for headache.    . furosemide (LASIX) 40 MG tablet TAKE 1 TABLET BY MOUTH EVERY DAY 30 tablet 1  . lisinopril (PRINIVIL,ZESTRIL) 5 MG tablet Take 1 tablet (5 mg total) by mouth daily. 90 tablet 0  . meloxicam (MOBIC) 7.5 MG tablet Take 1 tablet (7.5 mg total) by mouth daily. 10 tablet 0  . metoprolol succinate (TOPROL-XL) 25 MG 24 hr tablet TAKE 1/2 TABLET BY MOUTH EVERY DAY 15 tablet 1  . mupirocin ointment (BACTROBAN) 2 % Apply 1 application topically 2 (two) times daily. 22 g 0   No current facility-administered medications for this visit.     Allergies  Allergen Reactions  . Lactose Intolerance (Gi) Other (See Comments)    Pain,bloating    Family History  Problem Relation Age of Onset  . Other Mother        barin tumor  . Stroke Father   . Colon cancer Neg Hx   . Rectal cancer Neg Hx   . Stomach cancer Neg Hx     Social History   Socioeconomic History  . Marital status: Married    Spouse name: Not on file  . Number of children: 6  . Years of education: Not on file  . Highest education level: Not on file  Occupational History  . Not on file  Social Needs  . Financial resource strain: Not  on file  . Food insecurity:    Worry: Not on file    Inability: Not on file  . Transportation needs:    Medical: Not on file    Non-medical: Not on file  Tobacco Use  . Smoking status: Never Smoker  . Smokeless tobacco: Never Used  Substance and Sexual Activity  . Alcohol use: No    Alcohol/week: 0.0 standard drinks  . Drug use: No  . Sexual activity: Never  Lifestyle  . Physical activity:    Days per week: Not on file    Minutes per session: Not on file  . Stress: Not on file  Relationships  . Social connections:    Talks on phone: Not on file    Gets together: Not on file    Attends religious service: Not on file    Active member of club or organization: Not on file    Attends meetings of clubs or organizations: Not  on file    Relationship status: Not on file  . Intimate partner violence:    Fear of current or ex partner: Not on file    Emotionally abused: Not on file    Physically abused: Not on file    Forced sexual activity: Not on file  Other Topics Concern  . Not on file  Social History Narrative  . Not on file     Constitutional: Denies fever, malaise, fatigue, headache or abrupt weight changes.  HEENT: Denies eye pain, eye redness, ear pain, ringing in the ears, wax buildup, runny nose, nasal congestion, bloody nose, or sore throat. Respiratory: Denies difficulty breathing, shortness of breath, cough or sputum production.   Cardiovascular: Denies chest pain, chest tightness, palpitations or swelling in the hands or feet at this time.  Musculoskeletal: Pt reports left arm pain, chronic low back pain, muscle spasms in back and legs.  Denies difficulty with gait, joint pain, and swelling.  Skin: Denies redness, rashes, lesions or ulcercations.  Neurological: Denies problems with balance and coordination.    No other specific complaints in a complete review of systems (except as listed in HPI above).     Objective:   Physical Exam  BP 116/68   Pulse 73   Temp 98.1 F (36.7 C) (Oral)   Wt 121 lb (54.9 kg)   SpO2 98%   BMI 21.43 kg/m   Wt Readings from Last 3 Encounters:  09/06/18 119 lb (54 kg)  09/05/18 119 lb (54 kg)  10/17/17 125 lb 12.8 oz (57.1 kg)    General: Appears her stated age, well developed, well nourished in NAD. Skin: Warm, dry and intact. No rashes noted. Cardiovascular: Normal rate and rhythm. Radial pulses 2+ bilaterally. Pulmonary/Chest: Normal effort and positive vesicular breath sounds. No respiratory distress. No wheezes, rales or ronchi noted.  Musculoskeletal: Normal internal and external rotation of the left shoulder. Pain with palpation of the left proximal biceps tendon. Strength 5/5 BUE. Hand grips equal. Normal flexion, extension and rotation of the  spine. Bony tenderness noted over the lumbar spine. Strength 5/5 BLE. No difficulty with gait. Neurological: Alert and oriented. Coordination normal.      BMET    Component Value Date/Time   NA 142 10/17/2017 1058   K 4.1 10/17/2017 1058   CL 105 10/17/2017 1058   CO2 30 10/17/2017 1058   GLUCOSE 96 10/17/2017 1058   BUN 16 10/17/2017 1058   CREATININE 0.88 10/17/2017 1058   CALCIUM 9.3 10/17/2017 1058   CALCIUM 12.0 (H)  11/24/2007 1700   GFRNONAA 59 (L) 06/17/2017 2306   GFRAA >60 06/17/2017 2306    Lipid Panel     Component Value Date/Time   CHOL 179 03/08/2017 1120   TRIG 120.0 03/08/2017 1120   HDL 50.10 03/08/2017 1120   CHOLHDL 4 03/08/2017 1120   VLDL 24.0 03/08/2017 1120   LDLCALC 105 (H) 03/08/2017 1120    CBC    Component Value Date/Time   WBC 13.0 (H) 06/17/2017 2306   RBC 4.56 06/17/2017 2306   HGB 13.7 06/17/2017 2306   HCT 41.2 06/17/2017 2306   PLT 270 06/17/2017 2306   MCV 90.4 06/17/2017 2306   MCH 30.0 06/17/2017 2306   MCHC 33.3 06/17/2017 2306   RDW 13.4 06/17/2017 2306   LYMPHSABS 0.8 06/17/2017 2306   MONOABS 0.3 06/17/2017 2306   EOSABS 0.0 06/17/2017 2306   BASOSABS 0.0 06/17/2017 2306    Hgb A1C Lab Results  Component Value Date   HGBA1C  11/23/2007    5.0 (NOTE)   The ADA recommends the following therapeutic goals for glycemic   control related to Hgb A1C measurement:   Goal of Therapy:   < 7.0% Hgb A1C   Action Suggested:  > 8.0% Hgb A1C   Ref:  Diabetes Care, 22, Suppl. 1, 1999            Assessment & Plan:   UC follow up for Chest Pain and Left Arm Pain:    UC notes, imaging, and procedures reviewed Concern for biceps tendonitis- some improvement with Meloxicam but has finished that course Rx for 9 day Prednisone taper sent to pharmacy Avoid overuse of the left arm  Chronic Low Back Pain:  Referral placed to neurosurgery for continued care per pt request  Muscle Spasms:  Will check BMET, Mg today.   Start  OTC Magnesium supplement 400 mg PO QHS.    Will follow up after labs, return precautions discussed Nicki Reaperegina Baity, NP

## 2019-03-15 ENCOUNTER — Other Ambulatory Visit: Payer: Self-pay | Admitting: Internal Medicine

## 2019-03-21 ENCOUNTER — Other Ambulatory Visit: Payer: Self-pay | Admitting: Internal Medicine

## 2019-03-28 ENCOUNTER — Telehealth: Payer: Self-pay | Admitting: Internal Medicine

## 2019-03-28 NOTE — Telephone Encounter (Signed)
Spoke w/pt about her referral to [facility].  Rollene Fare from [facility] has spoken w/her and she has a balance w/their office and is Careers information officer. They will not see pt until balance is paid.  Pt doesn't want to go anywhere else to be seen for her referral.  Pt requests her referral be canceled and she will call back when her balance is paid and will go back to see [facility].

## 2019-03-29 NOTE — Telephone Encounter (Signed)
ok 

## 2019-04-15 ENCOUNTER — Other Ambulatory Visit: Payer: Self-pay | Admitting: Internal Medicine

## 2019-05-02 ENCOUNTER — Ambulatory Visit: Payer: Self-pay | Admitting: Internal Medicine

## 2019-05-02 NOTE — Telephone Encounter (Signed)
Nurse gave her protocols  for headache. Patient declined virtual visit, home care advice was given per protocol per documentation by Elliot Cousin RN.

## 2019-05-02 NOTE — Telephone Encounter (Signed)
Pt reports headache, onset Monday. Intermittent, rates at 7/10 when occurs. States has been taking tylenol which helps but comes back." Also reports "Ears clogged, like a muscle ache but not bad." Denies any sinus tenderness. Denies fever, no cough. States BP has been "Average, 120's/70's."  Also reports intermittent lightheadedness, "Just a little, not bad." States is staying hydrated,"But could probably drink more."  Declines virtual visit. Home Care advise given per protocol.  Advised to CB if symptoms worsen.  CB# [place] for Disposition . Headache  Answer Assessment - Initial Assessment Questions 1. LOCATION: "Where does it hurt?"      Front across the forehead 2. ONSET: "When did the headache start?" (Minutes, hours or days)      2 days ago 3. PATTERN: "Does the pain come and go, or has it been constant since it started?"    Comes and goes 4. SEVERITY: "How bad is the pain?" and "What does it keep you from doing?"  (e.g., Scale 1-10; mild, moderate, or severe)   - MILD (1-3): doesn't interfere with normal activities    - MODERATE (4-7): interferes with normal activities or awakens from sleep    - SEVERE (8-10): excruciating pain, unable to do any normal activities        7/10   5. RECURRENT SYMPTOM: "Have you ever had headaches before?" If so, ask: "When was the last time?" and "What happened that time?"      sometimes 6. CAUSE: "What do you think is causing the headache?"     Not sure. 7. MIGRAINE: "Have you been diagnosed with migraine headaches?" If so, ask: "Is this headache similar?"      Yes many years ago. Does not fell like that 8. HEAD INJURY: "Has there been any recent injury to the head?"      no 9. OTHER SYMPTOMS: "Do you have any other symptoms?" (fever, stiff neck, eye pain, sore throat, cold symptoms)    Earache, both "Like a muscle ache." Onset today. Rates at 3/10. "Like clogged."  Protocols used: HEADACHE-A-AH

## 2019-05-02 NOTE — Telephone Encounter (Signed)
What was the outcome of the call. Was she given advice? Did she schedule an appt?

## 2019-05-09 ENCOUNTER — Ambulatory Visit (INDEPENDENT_AMBULATORY_CARE_PROVIDER_SITE_OTHER): Payer: Medicare Other | Admitting: Family Medicine

## 2019-05-09 ENCOUNTER — Other Ambulatory Visit: Payer: Self-pay

## 2019-05-09 ENCOUNTER — Encounter: Payer: Self-pay | Admitting: Family Medicine

## 2019-05-09 DIAGNOSIS — H9201 Otalgia, right ear: Secondary | ICD-10-CM | POA: Insufficient documentation

## 2019-05-09 MED ORDER — FLUTICASONE PROPIONATE 50 MCG/ACT NA SUSP: 2.0000 | Freq: Every day | NASAL | 1 refills | Status: DC

## 2019-05-09 NOTE — Patient Instructions (Addendum)
Ear largely looking ok.  Possible pressure buildup from eustachian tube dysfunction.  Try flonase sent to pharmacy as well as ibuprofen OTC 400mg  or 2 tablets with food for next 3-5 days. Use nasal saline irrigation throughout the day to avoid nosebleeds on flonase.  Let us know if not improving or worsening symptoms.

## 2019-05-09 NOTE — Assessment & Plan Note (Addendum)
Overall benign exam - not consistent with AOM or external otitis. ?ETD - suggested flonase, low dose ibuprofen with meals x 3-5 days, nasal saline irrigation to avoid nose bleeds from flonase. Update if not improving with treatment. Pt agrees with plan.

## 2019-05-09 NOTE — Progress Notes (Signed)
This visit was conducted in person.  BP 120/78 (BP Location: Left Arm, Patient Position: Sitting, Cuff Size: Normal)   Pulse 78   Temp 98.4 F (36.9 C) (Temporal)   Ht 5\' 3"  (1.6 m)   Wt 119 lb 4 oz (54.1 kg)   SpO2 98%   BMI 21.12 kg/m    CC: R ear ache Subjective:    Patient ID: Madison FullingCarrie E Kerwood, female    DOB: 12-Oct-1950, 68 y.o.   MRN: [id number]  HPI: Madison Miller is a 68 y.o. female presenting on 05/09/2019 for Ear Pain (C/o right ear pain.  Started about 1 wk ago. )   1 wk h/o R earache associated with muffled hearing.  No fevers/chills, congestion, HA, ST or PNDrainage. No tooth pain or jaw pain.  No recent swimming.   Hasn't tried anything for this yet.      Relevant past medical, surgical, family and social history reviewed and updated as indicated. Interim medical history since our last visit reviewed. Allergies and medications reviewed and updated. Outpatient Medications Prior to Visit  Medication Sig Dispense Refill  . acetaminophen (TYLENOL) 500 MG tablet Take 500 mg by mouth 2 (two) times daily as needed for headache.    . furosemide (LASIX) 40 MG tablet Take 1 tablet (40 mg total) by mouth daily. MUST SCHEDULE PHYSICAL EXAM 30 tablet 1  . lisinopril (ZESTRIL) 5 MG tablet TAKE 1 TABLET BY MOUTH EVERY DAY 90 tablet 0  . metoprolol succinate (TOPROL-XL) 25 MG 24 hr tablet TAKE 1/2 TABLET BY MOUTH EVERY DAY 45 tablet 0  . predniSONE (DELTASONE) 10 MG tablet Take 3 tabs on days 1-3, take 2 tabs on days 4-6, take 1 tab on days 7-9 18 tablet 0   No facility-administered medications prior to visit.      Per HPI unless specifically indicated in ROS section below Review of Systems Objective:    BP 120/78 (BP Location: Left Arm, Patient Position: Sitting, Cuff Size: Normal)   Pulse 78   Temp 98.4 F (36.9 C) (Temporal)   Ht 5\' 3"  (1.6 m)   Wt 119 lb 4 oz (54.1 kg)   SpO2 98%   BMI 21.12 kg/m   Wt Readings from Last 3 Encounters:  05/09/19 119 lb  4 oz (54.1 kg)  02/27/19 121 lb (54.9 kg)  09/06/18 119 lb (54 kg)    Physical Exam Vitals signs and nursing note reviewed.  Constitutional:      General: She is not in acute distress.    Appearance: Normal appearance. She is not ill-appearing.  HENT:     Head: Normocephalic and atraumatic.     Comments: No TMJ pain or dental pain    Right Ear: Hearing, tympanic membrane, ear canal and external ear normal. There is no impacted cerumen.     Left Ear: Hearing, tympanic membrane, ear canal and external ear normal. There is no impacted cerumen.     Ears:     Comments:  Bump (papule) bilaterally anterior external ear canal Mild fluid behind TMs    Mouth/Throat:     Mouth: Mucous membranes are moist.     Pharynx: No posterior oropharyngeal erythema.  Eyes:     Extraocular Movements: Extraocular movements intact.     Pupils: Pupils are equal, round, and reactive to light.  Neck:     Musculoskeletal: Normal range of motion and neck supple.  Lymphadenopathy:     Cervical: No cervical adenopathy.  Neurological:  Mental Status: She is alert.       Results for orders placed or performed in visit on 07/16/24  Basic metabolic panel  Result Value Ref Range   Sodium 141 135 - 145 mEq/L   Potassium 3.8 3.5 - 5.1 mEq/L   Chloride 103 96 - 112 mEq/L   CO2 32 19 - 32 mEq/L   Glucose, Bld 88 70 - 99 mg/dL   BUN 19 6 - 23 mg/dL   Creatinine, Ser 1.03 0.40 - 1.20 mg/dL   Calcium 9.7 8.4 - 10.5 mg/dL   GFR 64.58 >60.00 mL/min  Magnesium  Result Value Ref Range   Magnesium 1.8 1.5 - 2.5 mg/dL   Assessment & Plan:   Problem List Items Addressed This Visit    Right ear pain    Overall benign exam - not consistent with AOM or external otitis. ?ETD - suggested flonase, low dose ibuprofen with meals x 3-5 days, nasal saline irrigation to avoid nose bleeds from flonase. Update if not improving with treatment. Pt agrees with plan.           Meds ordered this encounter  Medications  .  fluticasone (FLONASE) 50 MCG/ACT nasal spray    Sig: Place 2 sprays into both nostrils daily.    Dispense:  16 g    Refill:  1   No orders of the defined types were placed in this encounter.   Patient Instructions  Ear largely looking ok.  Possible pressure buildup from eustachian tube dysfunction.  Try flonase sent to pharmacy as well as ibuprofen OTC 400mg  or 2 tablets with food for next 3-5 days. Use nasal saline irrigation throughout the day to avoid nosebleeds on flonase.  Let us know if not improving or worsening symptoms.    Follow up plan: No follow-ups on file.  Ria Bush, MD

## 2019-05-11 ENCOUNTER — Other Ambulatory Visit: Payer: Self-pay | Admitting: Internal Medicine

## 2019-05-31 ENCOUNTER — Other Ambulatory Visit: Payer: Self-pay | Admitting: Family Medicine

## 2019-06-01 ENCOUNTER — Other Ambulatory Visit: Payer: Self-pay | Admitting: Internal Medicine

## 2019-06-09 ENCOUNTER — Other Ambulatory Visit: Payer: Self-pay | Admitting: Internal Medicine

## 2019-07-03 ENCOUNTER — Other Ambulatory Visit: Payer: Self-pay | Admitting: Internal Medicine

## 2019-07-14 ENCOUNTER — Other Ambulatory Visit: Payer: Self-pay | Admitting: Internal Medicine

## 2019-07-16 ENCOUNTER — Other Ambulatory Visit: Payer: Self-pay | Admitting: Internal Medicine

## 2019-07-25 ENCOUNTER — Telehealth: Payer: Self-pay | Admitting: *Deleted

## 2019-07-25 NOTE — Telephone Encounter (Signed)
Agree with advice given

## 2019-07-25 NOTE — Telephone Encounter (Signed)
Patient called to schedule an appointment. Patient was transferred to Triage because of her symptoms. Patient stated that she has been having chest pain for about 4 days. Patient stated that sometimes she has back pain and a headache. Patient stated that she has not had any dizziness, SOB or difficulty breathing. After speaking to Webb Silversmith NP patient was advised that she needs to go to the ER since she has chest pain. Patient stated that she does not want to go to the ER and just wants to come to the office. Advised patient again with chest pain and her history she needs to go to the ER, which she refused again. Patient stated that she might be willing to go to [facility] on [address], but not sure when she will go because she does not have transportation. Advised patient that maybe she can take a taxi, uber or another means of transportation to [facility] and she stated that she was not going to do that.  Advised patient again that the  recommendation to her is that she go now for treatment and she verbalized understanding. Patient stated that she will see what she can do about getting transportation.

## 2019-07-27 ENCOUNTER — Other Ambulatory Visit: Payer: Self-pay

## 2019-07-27 ENCOUNTER — Encounter: Payer: Self-pay | Admitting: Emergency Medicine

## 2019-07-27 ENCOUNTER — Emergency Department
Admission: EM | Admit: 2019-07-27 | Discharge: 2019-07-28 | Disposition: A | Discharge status: Home / Self Care | Payer: Medicare Other | Attending: Emergency Medicine | Admitting: Emergency Medicine

## 2019-07-27 ENCOUNTER — Emergency Department: Payer: Medicare Other

## 2019-07-27 DIAGNOSIS — R0789 Other chest pain: Secondary | ICD-10-CM | POA: Diagnosis not present

## 2019-07-27 DIAGNOSIS — Z79899 Other long term (current) drug therapy: Secondary | ICD-10-CM | POA: Insufficient documentation

## 2019-07-27 DIAGNOSIS — R079 Chest pain, unspecified: Secondary | ICD-10-CM | POA: Insufficient documentation

## 2019-07-27 DIAGNOSIS — I110 Hypertensive heart disease with heart failure: Secondary | ICD-10-CM | POA: Diagnosis not present

## 2019-07-27 DIAGNOSIS — I509 Heart failure, unspecified: Secondary | ICD-10-CM | POA: Insufficient documentation

## 2019-07-27 LAB — CBC
HCT: 39.1 % (ref 36.0–46.0)
Hemoglobin: 12.8 g/dL (ref 12.0–15.0)
MCH: 29.5 pg (ref 26.0–34.0)
MCHC: 32.7 g/dL (ref 30.0–36.0)
MCV: 90.1 fL (ref 80.0–100.0)
Platelets: 234 10*3/uL (ref 150–400)
RBC: 4.34 MIL/uL (ref 3.87–5.11)
RDW: 13 % (ref 11.5–15.5)
WBC: 7.4 10*3/uL (ref 4.0–10.5)
nRBC: 0 % (ref 0.0–0.2)

## 2019-07-27 LAB — URINALYSIS, COMPLETE (UACMP) WITH MICROSCOPIC
Bacteria, UA: NONE SEEN
Bilirubin Urine: NEGATIVE
Glucose, UA: NEGATIVE mg/dL
Ketones, ur: NEGATIVE mg/dL
Nitrite: NEGATIVE
Protein, ur: 30 mg/dL — AB
RBC / HPF: >50 RBC/hpf — ABNORMAL HIGH (ref 0–5)
Specific Gravity, Urine: 1.016 (ref 1.005–1.030)
pH: 5 (ref 5.0–8.0)

## 2019-07-27 LAB — BASIC METABOLIC PANEL
Anion gap: 10 (ref 5–15)
BUN: 28 mg/dL — ABNORMAL HIGH (ref 8–23)
CO2: 29 mmol/L (ref 22–32)
Calcium: 10 mg/dL (ref 8.9–10.3)
Chloride: 102 mmol/L (ref 98–111)
Creatinine, Ser: 1.22 mg/dL — ABNORMAL HIGH (ref 0.44–1.00)
GFR calc Af Amer: 53 mL/min — ABNORMAL LOW (ref >60)
GFR calc non Af Amer: 46 mL/min — ABNORMAL LOW (ref >60)
Glucose, Bld: 109 mg/dL — ABNORMAL HIGH (ref 70–99)
Potassium: 3.3 mmol/L — ABNORMAL LOW (ref 3.5–5.1)
Sodium: 141 mmol/L (ref 135–145)

## 2019-07-27 LAB — BRAIN NATRIURETIC PEPTIDE: B Natriuretic Peptide: 16 pg/mL (ref 0.0–100.0)

## 2019-07-27 LAB — TROPONIN I (HIGH SENSITIVITY)
Troponin I (High Sensitivity): 3 ng/L (ref <18)
Troponin I (High Sensitivity): 3 ng/L (ref <18)

## 2019-07-27 MED ORDER — FOSFOMYCIN TROMETHAMINE 3 G PO PACK
3.0000 g | PACK | Freq: Once | ORAL | Status: AC
  Administered 2019-07-27: 3 g via ORAL
  Filled 2019-07-27: qty 3

## 2019-07-27 MED ORDER — HYDROCODONE-ACETAMINOPHEN 5-325 MG PO TABS
1.0000 | ORAL_TABLET | Freq: Once | ORAL | Status: AC
  Administered 2019-07-27: 1 via ORAL
  Filled 2019-07-27: qty 1

## 2019-07-27 MED ORDER — SODIUM CHLORIDE 0.9% FLUSH: 3.0000 mL | Freq: Once | INTRAVENOUS | Status: DC

## 2019-07-27 MED ORDER — KETOROLAC TROMETHAMINE 30 MG/ML IJ SOLN
10.0000 mg | Freq: Once | INTRAMUSCULAR | Status: AC
  Administered 2019-07-27: 9.9 mg via INTRAVENOUS
  Filled 2019-07-27: qty 1

## 2019-07-27 NOTE — ED Notes (Signed)
This RN to bedside to place IV to collect 2nd trop. Pt adamant she needed to urinate first. Unplugged from monitor to use bedside toilet. Will place IV once pt finished.

## 2019-07-27 NOTE — ED Notes (Signed)
Pt denies any possible muscle injury; denies lifting heavy objects or activity; states pain started while at rest days ago; denies SOB.

## 2019-07-27 NOTE — ED Notes (Signed)
Pt given mustard packets per verbal order from [place].

## 2019-07-27 NOTE — ED Provider Notes (Signed)
[facility] Emergency Department Provider Note   ____________________________________________   First MD Initiated Contact with Patient 07/27/19 2312     (approximate)  I have reviewed the triage vital signs and the nursing notes.   HISTORY  Chief Complaint Chest Pain    HPI Madison Miller is a 68 y.o. female who presents to the ED from home with a chief complaint of chest pain.  Patient reports constant, nonradiating central chest "soreness" for the past 5 days.  Denies feelings of tightness, pressure, ripping or tearing sensation, sharp or burning.  Denies associated diaphoresis, nausea/vomiting, shortness of breath, palpitations or dizziness.  Denies recent fever, cough, abdominal pain, diarrhea.  Denies recent travel, trauma or hormone use.       Past Medical History:  Diagnosis Date  . CHF (congestive heart failure) ([place])   . GERD (gastroesophageal reflux disease)   . HTN (hypertension)   . Hyperparathyroidism   . Migraine, unspecified, with intractable migraine, so stated, without mention of status migrainosus   . Right bundle branch block and left anterior fascicular block   . SVT (supraventricular tachycardia) [facility])     Patient Active Problem List   Diagnosis Date Noted  . Right ear pain 05/09/2019  . CHF (congestive heart failure) ([place]) 10/17/2017  . GERD (gastroesophageal reflux disease)   . HTN (hypertension) 06/11/2014  . ALLERGIC RHINITIS, SEASONAL 07/03/2008  . Intractable migraine 12/15/2006    Past Surgical History:  Procedure Laterality Date  . LUMBAR LAMINECTOMY  2010   Dr. Luiz Ochoa  . PARATHYROIDECTOMY  2009    Prior to Admission medications   Medication Sig Start Date End Date Taking? Authorizing Provider  acetaminophen (TYLENOL) 500 MG tablet Take 500 mg by mouth 2 (two) times daily as needed for headache.    [provider]  fluticasone (FLONASE) 50 MCG/ACT nasal spray SPRAY 2 SPRAYS INTO EACH NOSTRIL  EVERY DAY 06/01/19   Ria Bush, MD  furosemide (LASIX) 40 MG tablet TAKE 1 TABLET (40 MG TOTAL) BY MOUTH DAILY. MUST SCHEDULE PHYSICAL EXAM 07/17/19   Jearld Fenton, NP  lisinopril (ZESTRIL) 5 MG tablet TAKE 1 TABLET BY MOUTH EVERY DAY 07/16/19   Jearld Fenton, NP  metoprolol succinate (TOPROL-XL) 25 MG 24 hr tablet TAKE 1/2 TABLET BY MOUTH EVERY DAY 03/21/19   Jearld Fenton, NP    Allergies Lactose intolerance (gi)  Family History  Problem Relation Age of Onset  . Other Mother        barin tumor  . Stroke Father   . Colon cancer Neg Hx   . Rectal cancer Neg Hx   . Stomach cancer Neg Hx     Social History Social History   Tobacco Use  . Smoking status: Never Smoker  . Smokeless tobacco: Never Used  Substance Use Topics  . Alcohol use: No    Alcohol/week: 0.0 standard drinks  . Drug use: No    Review of Systems  Constitutional: No fever/chills Eyes: No visual changes. ENT: No sore throat. Cardiovascular: Positive for chest pain. Respiratory: Denies shortness of breath. Gastrointestinal: No abdominal pain.  No nausea, no vomiting.  No diarrhea.  No constipation. Genitourinary: Negative for dysuria. Musculoskeletal: Negative for back pain. Skin: Negative for rash. Neurological: Negative for headaches, focal weakness or numbness.   ____________________________________________   PHYSICAL EXAM:  VITAL SIGNS: ED Triage Vitals  Enc Vitals Group     BP 07/27/19 1809 (!) 145/81     Pulse Rate 07/27/19  1809 65     Resp 07/27/19 2256 12     Temp 07/27/19 1809 99.8 F (37.7 C)     Temp Source 07/27/19 1809 Oral     SpO2 07/27/19 1809 100 %     Weight 07/27/19 1823 119 lb 4.3 oz (54.1 kg)     Height 07/27/19 1823 5\' 5"  (1.651 m)     Head Circumference --      Peak Flow --      Pain Score 07/27/19 1822 5     Pain Loc --      Pain Edu? --      Excl. in GC? --     Constitutional: Alert and oriented. Well appearing and in no acute distress. Eyes:  Conjunctivae are normal. PERRL. EOMI. Head: Atraumatic. Nose: No congestion/rhinnorhea. Mouth/Throat: Mucous membranes are moist.  Oropharynx non-erythematous. Neck: No stridor.   Cardiovascular: Normal rate, regular rhythm. Grossly normal heart sounds.  Good peripheral circulation. Respiratory: Normal respiratory effort.  No retractions. Lungs CTAB.  Sternum tender to palpation. Gastrointestinal: Soft and nontender to light or deep palpation. No distention. No abdominal bruits. No CVA tenderness. Musculoskeletal: No lower extremity tenderness nor edema.  No joint effusions. Neurologic:  Normal speech and language. No gross focal neurologic deficits are appreciated. No gait instability. Skin:  Skin is warm, dry and intact. No rash noted. Psychiatric: Mood and affect are normal. Speech and behavior are normal.  ____________________________________________   LABS (all labs ordered are listed, but only abnormal results are displayed)  Labs Reviewed  BASIC METABOLIC PANEL - Abnormal; Notable for the following components:      Result Value   Potassium 3.3 (*)    Glucose, Bld 109 (*)    BUN 28 (*)    Creatinine, Ser 1.22 (*)    GFR calc non Af Amer 46 (*)    GFR calc Af Amer 53 (*)    All other components within normal limits  URINALYSIS, COMPLETE (UACMP) WITH MICROSCOPIC - Abnormal; Notable for the following components:   Color, Urine YELLOW (*)    APPearance CLOUDY (*)    Hgb urine dipstick LARGE (*)    Protein, ur 30 (*)    Leukocytes,Ua TRACE (*)    RBC / HPF >50 (*)    All other components within normal limits  URINE CULTURE  CBC  BRAIN NATRIURETIC PEPTIDE  TROPONIN I (HIGH SENSITIVITY)  TROPONIN I (HIGH SENSITIVITY)   ____________________________________________  EKG  ED ECG REPORT I, , J, the attending physician, personally viewed and interpreted this ECG.   Date: 07/27/2019  EKG Time: 1802  Rate: 66  Rhythm: normal EKG, normal sinus rhythm  Axis: LAD   Intervals:right bundle branch block  ST&T Change: Nonspecific  ____________________________________________  RADIOLOGY  ED MD interpretation: No acute cardiopulmonary process  Official radiology report(s): Dg Chest 2 View  Result Date: 07/27/2019 CLINICAL DATA:  Chest pain. EXAM: CHEST - 2 VIEW COMPARISON:  Chest radiograph 02/14/2019 FINDINGS: Normal cardiac and mediastinal contours. No consolidative pulmonary opacities. No pleural effusion or pneumothorax. Thoracic spine degenerative changes. IMPRESSION: No acute cardiopulmonary process. Electronically Signed   By: 02/16/2019 M.D.   On: 07/27/2019 19:08    ____________________________________________   PROCEDURES  Procedure(s) performed (including Critical Care):  Procedures   ____________________________________________   INITIAL IMPRESSION / ASSESSMENT AND PLAN / ED COURSE  As part of my medical decision making, I reviewed the following data within the electronic MEDICAL RECORD NUMBER Nursing notes reviewed and incorporated, Labs  reviewed, EKG interpreted, Old chart reviewed, Radiograph reviewed, Notes from prior ED visits and [place] Controlled Substance Database     Madison Miller was evaluated in Emergency Department on 07/28/2019 for the symptoms described in the history of present illness. She was evaluated in the context of the global COVID-19 pandemic, which necessitated consideration that the patient might be at risk for infection with the SARS-CoV-2 virus that causes COVID-19. Institutional protocols and algorithms that pertain to the evaluation of patients at risk for COVID-19 are in a state of rapid change based on information released by regulatory bodies including the CDC and federal and state organizations. These policies and algorithms were followed during the patient's care in the ED.    68 year old female who presents with a 5-day history of chest pain. Differential diagnosis includes, but is not limited to, ACS,  aortic dissection, pulmonary embolism, cardiac tamponade, pneumothorax, pneumonia, pericarditis, myocarditis, GI-related causes including esophagitis/gastritis, and musculoskeletal chest wall pain.    Initial troponin unremarkable.  Pain is reproducible on exam.  Will repeat troponin, check BNP.  Administer NSAIDs, analgesia and reassess.  Asking for something for leg cramps.   Clinical Course as of Jul 27 28  Sat Jul 28, 2019  0028 Updated patient on repeat troponin and BNP results.  She is feeling much better.  Will discharge home on Norco to use as needed.  Strict return precautions given.  Patient verbalizes understanding agrees with plan of care.   [JS]    Clinical Course User Index [JS] Irean HongSung,  J, MD     ____________________________________________   FINAL CLINICAL IMPRESSION(S) / ED DIAGNOSES  Final diagnoses:  Nonspecific chest pain  Chest wall pain     ED Discharge Orders    None       Note:  This document was prepared using Dragon voice recognition software and may include unintentional dictation errors.   Irean HongSung,  J, MD 07/28/19 [phone]

## 2019-07-27 NOTE — ED Triage Notes (Signed)
C?O CP x 5 days.  AAOx3.  Skin warm and dry. NAD

## 2019-07-27 NOTE — ED Notes (Signed)
EDP Sung at bedside.  

## 2019-07-27 NOTE — ED Notes (Signed)
Called lab to add on BNP. Ubaldo Glassing states it will be added on.

## 2019-07-27 NOTE — ED Notes (Signed)
This RN completed 2nd EKG. No major changes from initial EKG noted.

## 2019-07-28 MED ORDER — HYDROCODONE-ACETAMINOPHEN 5-325 MG PO TABS: 1.0000 | ORAL_TABLET | Freq: Four times a day (QID) | ORAL | 0 refills | Status: DC | PRN

## 2019-07-28 NOTE — Discharge Instructions (Addendum)
1. You may take Norco as needed for pain. °2. Apply moist heat to affected area several times daily. °3. Return to the ER for worsening symptoms, persistent vomiting, difficulty breathing or other concerns. °

## 2019-07-29 LAB — URINE CULTURE: Culture: <10000 — AB

## 2019-08-15 ENCOUNTER — Other Ambulatory Visit: Payer: Self-pay | Admitting: Internal Medicine

## 2019-09-12 ENCOUNTER — Other Ambulatory Visit: Payer: Self-pay | Admitting: Internal Medicine

## 2019-09-18 ENCOUNTER — Other Ambulatory Visit: Payer: Self-pay | Admitting: Internal Medicine

## 2019-09-26 ENCOUNTER — Other Ambulatory Visit: Payer: Self-pay | Admitting: Internal Medicine

## 2019-10-21 ENCOUNTER — Other Ambulatory Visit: Payer: Self-pay | Admitting: Internal Medicine

## 2019-11-21 ENCOUNTER — Other Ambulatory Visit: Payer: Self-pay | Admitting: Internal Medicine

## 2019-11-28 ENCOUNTER — Other Ambulatory Visit: Payer: Self-pay | Admitting: Internal Medicine

## 2019-12-22 ENCOUNTER — Other Ambulatory Visit: Payer: Self-pay | Admitting: Internal Medicine

## 2020-01-24 ENCOUNTER — Other Ambulatory Visit: Payer: Self-pay | Admitting: Internal Medicine

## 2020-02-16 ENCOUNTER — Other Ambulatory Visit: Payer: Self-pay | Admitting: Internal Medicine

## 2020-02-21 ENCOUNTER — Telehealth: Payer: Self-pay | Admitting: Internal Medicine

## 2020-02-21 NOTE — Telephone Encounter (Signed)
Patient called today about paperwork she needs completed She stated it was for her tags. She stated she is handy capped and they asked for the doctor to fill out paperwork. Asked patient if this was for a handy capped placard and she said it was for her tags and they we have the paperwork in the office for this. Patient requested a call back so she can tell us what type of car it is for?    Please advise

## 2020-02-21 NOTE — Telephone Encounter (Signed)
We do not have PPW for pt and pt is overdue for AWV and I have been trying to get in touch with her, sending letter to have her schedule appt.... Even if it is for a Handicap placard, she will need to be seen, it has been 1 year

## 2020-02-21 NOTE — Telephone Encounter (Signed)
Patient dropped off handicapped placard forms. Forms are in rx tower.  Please call patient when forms are ready for pick up.

## 2020-02-21 NOTE — Telephone Encounter (Signed)
Left message on patient's voice mail   See previous message, need to schedule AWV

## 2020-02-23 ENCOUNTER — Other Ambulatory Visit: Payer: Self-pay | Admitting: Internal Medicine

## 2020-02-27 ENCOUNTER — Ambulatory Visit: Payer: Medicare Other | Admitting: Internal Medicine

## 2020-02-28 ENCOUNTER — Other Ambulatory Visit: Payer: Self-pay

## 2020-02-28 ENCOUNTER — Encounter: Payer: Self-pay | Admitting: Internal Medicine

## 2020-02-28 ENCOUNTER — Ambulatory Visit (INDEPENDENT_AMBULATORY_CARE_PROVIDER_SITE_OTHER)
Admission: RE | Admit: 2020-02-28 | Discharge: 2020-02-28 | Disposition: A | Discharge status: Home / Self Care | Payer: Medicare Other | Source: Ambulatory Visit | Attending: Internal Medicine | Admitting: Internal Medicine

## 2020-02-28 ENCOUNTER — Ambulatory Visit (INDEPENDENT_AMBULATORY_CARE_PROVIDER_SITE_OTHER): Payer: Medicare Other | Admitting: Internal Medicine

## 2020-02-28 VITALS — BP 118/70 | HR 69 | Temp 97.5°F | Wt 113.0 lb

## 2020-02-28 DIAGNOSIS — R300 Dysuria: Secondary | ICD-10-CM | POA: Diagnosis not present

## 2020-02-28 DIAGNOSIS — M545 Low back pain, unspecified: Secondary | ICD-10-CM

## 2020-02-28 LAB — POC URINALSYSI DIPSTICK (AUTOMATED)
Bilirubin, UA: NEGATIVE
Glucose, UA: NEGATIVE
Ketones, UA: NEGATIVE
Leukocytes, UA: NEGATIVE
Nitrite, UA: NEGATIVE
Protein, UA: NEGATIVE
Spec Grav, UA: 1.015 (ref 1.010–1.025)
Urobilinogen, UA: 0.2 E.U./dL
pH, UA: 7 (ref 5.0–8.0)

## 2020-02-28 NOTE — Progress Notes (Signed)
Subjective:    Patient ID: Madison Miller, female    DOB: [birth year], 69 y.o.   MRN: [id number]  HPI  Pt presents to the clinic today with c/o low back pain, dark urine and dysuria. This started 2 weeks ago. The back pain is located in the middle of her lower back. She describes the pain as sore. The pain does not radiate. She denies numbness, tingling or weakness of her lower extremities. She denies urgency, frequency, blood in her urine, urine odor, vaginal discharge, itching, irritation or abnormal uterine bleeding. She has tried Tramadol with minimal relief of symptoms.  Review of Systems      Past Medical History:  Diagnosis Date  . CHF (congestive heart failure) ([facility])   . GERD (gastroesophageal reflux disease)   . HTN (hypertension)   . Hyperparathyroidism   . Migraine, unspecified, with intractable migraine, so stated, without mention of status migrainosus   . Right bundle branch block and left anterior fascicular block   . SVT (supraventricular tachycardia) ([facility])     Current Outpatient Medications  Medication Sig Dispense Refill  . acetaminophen (TYLENOL) 500 MG tablet Take 500 mg by mouth 2 (two) times daily as needed for headache.    . fluticasone (FLONASE) 50 MCG/ACT nasal spray SPRAY 2 SPRAYS INTO EACH NOSTRIL EVERY DAY 16 mL 1  . furosemide (LASIX) 40 MG tablet TAKE 1 TABLET (40 MG TOTAL) BY MOUTH DAILY. MUST SCHEDULE PHYSICAL EXAM FOR REFILLS. 30 tablet 0  . lisinopril (ZESTRIL) 5 MG tablet TAKE 1 TABLET (5 MG TOTAL) BY MOUTH DAILY. MUST SCHEDULE PHYSICAL 90 tablet 1  . metoprolol succinate (TOPROL-XL) 25 MG 24 hr tablet TAKE 1/2 TABLET BY MOUTH EVERY DAY 45 tablet 0   No current facility-administered medications for this visit.    Allergies  Allergen Reactions  . Lactose Intolerance (Gi) Other (See Comments)    Pain,bloating    Family History  Problem Relation Age of Onset  . Other Mother        barin tumor  . Stroke Father   . Colon cancer Neg Hx     . Rectal cancer Neg Hx   . Stomach cancer Neg Hx     Social History   Socioeconomic History  . Marital status: Married    Spouse name: Not on file  . Number of children: 6  . Years of education: Not on file  . Highest education level: Not on file  Occupational History  . Not on file  Tobacco Use  . Smoking status: Never Smoker  . Smokeless tobacco: Never Used  Vaping Use  . Vaping Use: Never used  Substance and Sexual Activity  . Alcohol use: No    Alcohol/week: 0.0 standard drinks  . Drug use: No  . Sexual activity: Never  Other Topics Concern  . Not on file  Social History Narrative  . Not on file   Social Determinants of Health   Financial Resource Strain:   . Difficulty of Paying Living Expenses:   Food Insecurity:   . Worried About Programme researcher, broadcasting/film/video in the Last Year:   . Barista in the Last Year:   Transportation Needs:   . Freight forwarder (Medical):   Marland Kitchen Lack of Transportation (Non-Medical):   Physical Activity:   . Days of Exercise per Week:   . Minutes of Exercise per Session:   Stress:   . Feeling of Stress :   Social Connections:   .  Frequency of Communication with Friends and Family:   . Frequency of Social Gatherings with Friends and Family:   . Attends Religious Services:   . Active Member of Clubs or Organizations:   . Attends Archivist Meetings:   Marland Kitchen Marital Status:   Intimate Partner Violence:   . Fear of Current or Ex-Partner:   . Emotionally Abused:   Marland Kitchen Physically Abused:   . Sexually Abused:      Constitutional: Denies fever, malaise, fatigue, headache or abrupt weight changes.  Respiratory: Denies difficulty breathing, shortness of breath, cough or sputum production.   Cardiovascular: Denies chest pain, chest tightness, palpitations or swelling in the hands or feet.  Gastrointestinal: Denies abdominal pain, bloating, constipation, diarrhea or blood in the stool.  GU: Pt reports dysuria, dark urine. Denies  urgency, frequency, burning sensation, blood in urine, odor or discharge. Musculoskeletal: Pt reports low back pain. Denies decrease in range of motion, difficulty with gait, or joint pain and swelling.  Skin: Denies redness, rashes, lesions or ulcercations.  Neurological: Denies dizziness, difficulty with memory, difficulty with speech or problems with balance and coordination.    No other specific complaints in a complete review of systems (except as listed in HPI above).  Objective:   Physical Exam  BP 118/70   Pulse 69   Temp (!) 97.5 F (36.4 C) (Temporal)   Wt 113 lb (51.3 kg)   SpO2 98%   BMI 18.80 kg/m   Wt Readings from Last 3 Encounters:  07/27/19 119 lb 4.3 oz (54.1 kg)  05/09/19 119 lb 4 oz (54.1 kg)  02/27/19 121 lb (54.9 kg)    General: Appears her stated age, well developed, well nourished in NAD. Skin: Warm, dry and intact.  Cardiovascular: Normal rate. Pulmonary/Chest: Normal effort. Abdomen: Soft and nontender. No CVA tenderness. MSK: Normal flexion, extension and rotation of the spine. No bony tenderness noted over the spine. Strength 5/5 BLE. Able to stand on heels and toes. Neurological: Alert and oriented.    BMET    Component Value Date/Time   NA 141 07/27/2019 1810   K 3.3 (L) 07/27/2019 1810   CL 102 07/27/2019 1810   CO2 29 07/27/2019 1810   GLUCOSE 109 (H) 07/27/2019 1810   BUN 28 (H) 07/27/2019 1810   CREATININE 1.22 (H) 07/27/2019 1810   CALCIUM 10.0 07/27/2019 1810   CALCIUM 12.0 (H) 11/24/2007 1700   GFRNONAA 46 (L) 07/27/2019 1810   GFRAA 53 (L) 07/27/2019 1810    Lipid Panel     Component Value Date/Time   CHOL 179 03/08/2017 1120   TRIG 120.0 03/08/2017 1120   HDL 50.10 03/08/2017 1120   CHOLHDL 4 03/08/2017 1120   VLDL 24.0 03/08/2017 1120   LDLCALC 105 (H) 03/08/2017 1120    CBC    Component Value Date/Time   WBC 7.4 07/27/2019 1810   RBC 4.34 07/27/2019 1810   HGB 12.8 07/27/2019 1810   HCT 39.1 07/27/2019 1810     PLT 234 07/27/2019 1810   MCV 90.1 07/27/2019 1810   MCH 29.5 07/27/2019 1810   MCHC 32.7 07/27/2019 1810   RDW 13.0 07/27/2019 1810   LYMPHSABS 0.8 06/17/2017 2306   MONOABS 0.3 06/17/2017 2306   EOSABS 0.0 06/17/2017 2306   BASOSABS 0.0 06/17/2017 2306    Hgb A1C Lab Results  Component Value Date   HGBA1C  11/23/2007    5.0 (NOTE)   The ADA recommends the following therapeutic goals for glycemic   control  related to Hgb A1C measurement:   Goal of Therapy:   < 7.0% Hgb A1C   Action Suggested:  > 8.0% Hgb A1C   Ref:  Diabetes Care, 22, Suppl. 1, 1999           Assessment & Plan:  Acute Midline Low Back Pain, Dysuria:  Xray lumbar spine today Urinalysis: 1+ blood Will send urine culture Will send wet prep Continue Tylenol as needed for comfort  Will follow up after xray and urine culture is back, return precautions discussed  Nicki Reaper, NP This visit occurred during the SARS-CoV-2 public health emergency.  Safety protocols were in place, including screening questions prior to the visit, additional usage of staff PPE, and extensive cleaning of exam room while observing appropriate contact time as indicated for disinfecting solutions.

## 2020-02-28 NOTE — Patient Instructions (Signed)

## 2020-02-28 NOTE — Addendum Note (Signed)
Addended by: Roena Malady on: 02/28/2020 12:15 PM   Modules accepted: Orders

## 2020-02-29 ENCOUNTER — Other Ambulatory Visit: Payer: Self-pay | Admitting: Internal Medicine

## 2020-02-29 LAB — URINE CULTURE
MICRO NUMBER:: 10576511
SPECIMEN QUALITY:: ADEQUATE

## 2020-02-29 LAB — WET PREP BY MOLECULAR PROBE
Candida species: NOT DETECTED
Gardnerella vaginalis: NOT DETECTED
MICRO NUMBER:: 10576510
SPECIMEN QUALITY:: ADEQUATE
Trichomonas vaginosis: NOT DETECTED

## 2020-03-15 ENCOUNTER — Encounter (HOSPITAL_COMMUNITY): Payer: Self-pay | Admitting: Radiology

## 2020-03-15 ENCOUNTER — Other Ambulatory Visit: Payer: Self-pay

## 2020-03-15 ENCOUNTER — Emergency Department (HOSPITAL_COMMUNITY)
Admission: EM | Admit: 2020-03-15 | Discharge: 2020-03-15 | Disposition: A | Discharge status: Home / Self Care | Payer: Medicare Other | Attending: Emergency Medicine | Admitting: Emergency Medicine

## 2020-03-15 ENCOUNTER — Emergency Department (HOSPITAL_COMMUNITY): Payer: Medicare Other

## 2020-03-15 DIAGNOSIS — I110 Hypertensive heart disease with heart failure: Secondary | ICD-10-CM | POA: Diagnosis not present

## 2020-03-15 DIAGNOSIS — Z79899 Other long term (current) drug therapy: Secondary | ICD-10-CM | POA: Diagnosis not present

## 2020-03-15 DIAGNOSIS — I509 Heart failure, unspecified: Secondary | ICD-10-CM | POA: Insufficient documentation

## 2020-03-15 DIAGNOSIS — R0789 Other chest pain: Secondary | ICD-10-CM | POA: Diagnosis not present

## 2020-03-15 DIAGNOSIS — R079 Chest pain, unspecified: Secondary | ICD-10-CM | POA: Diagnosis not present

## 2020-03-15 DIAGNOSIS — R519 Headache, unspecified: Secondary | ICD-10-CM | POA: Diagnosis not present

## 2020-03-15 DIAGNOSIS — R042 Hemoptysis: Secondary | ICD-10-CM | POA: Diagnosis not present

## 2020-03-15 LAB — URINALYSIS, ROUTINE W REFLEX MICROSCOPIC
Bilirubin Urine: NEGATIVE
Glucose, UA: NEGATIVE mg/dL
Hgb urine dipstick: NEGATIVE
Ketones, ur: 5 mg/dL — AB
Nitrite: NEGATIVE
Protein, ur: NEGATIVE mg/dL
Specific Gravity, Urine: 1.023 (ref 1.005–1.030)
pH: 5 (ref 5.0–8.0)

## 2020-03-15 LAB — COMPREHENSIVE METABOLIC PANEL
ALT: 15 U/L (ref 0–44)
AST: 27 U/L (ref 15–41)
Albumin: 4.5 g/dL (ref 3.5–5.0)
Alkaline Phosphatase: 39 U/L (ref 38–126)
Anion gap: 10 (ref 5–15)
BUN: 17 mg/dL (ref 8–23)
CO2: 28 mmol/L (ref 22–32)
Calcium: 9.9 mg/dL (ref 8.9–10.3)
Chloride: 102 mmol/L (ref 98–111)
Creatinine, Ser: 0.98 mg/dL (ref 0.44–1.00)
GFR calc Af Amer: >60 mL/min (ref >60)
GFR calc non Af Amer: 59 mL/min — ABNORMAL LOW (ref >60)
Glucose, Bld: 105 mg/dL — ABNORMAL HIGH (ref 70–99)
Potassium: 3.8 mmol/L (ref 3.5–5.1)
Sodium: 140 mmol/L (ref 135–145)
Total Bilirubin: 0.6 mg/dL (ref 0.3–1.2)
Total Protein: 7.8 g/dL (ref 6.5–8.1)

## 2020-03-15 LAB — CBC
HCT: 40.6 % (ref 36.0–46.0)
Hemoglobin: 13 g/dL (ref 12.0–15.0)
MCH: 30 pg (ref 26.0–34.0)
MCHC: 32 g/dL (ref 30.0–36.0)
MCV: 93.8 fL (ref 80.0–100.0)
Platelets: 249 10*3/uL (ref 150–400)
RBC: 4.33 MIL/uL (ref 3.87–5.11)
RDW: 13.1 % (ref 11.5–15.5)
WBC: 8.3 10*3/uL (ref 4.0–10.5)
nRBC: 0 % (ref 0.0–0.2)

## 2020-03-15 LAB — TROPONIN I (HIGH SENSITIVITY)
Troponin I (High Sensitivity): 4 ng/L (ref <18)
Troponin I (High Sensitivity): 3 ng/L (ref <18)

## 2020-03-15 LAB — D-DIMER, QUANTITATIVE: D-Dimer, Quant: 2.89 ug/mL-FEU — ABNORMAL HIGH (ref 0.00–0.50)

## 2020-03-15 LAB — LIPASE, BLOOD: Lipase: 41 U/L (ref 11–51)

## 2020-03-15 MED ORDER — SODIUM CHLORIDE 0.9% FLUSH: 3.0000 mL | Freq: Once | INTRAVENOUS | Status: DC

## 2020-03-15 MED ORDER — ACETAMINOPHEN 500 MG PO TABS
1000.0000 mg | ORAL_TABLET | Freq: Once | ORAL | Status: AC
  Administered 2020-03-15: 1000 mg via ORAL
  Filled 2020-03-15: qty 2

## 2020-03-15 MED ORDER — METOCLOPRAMIDE HCL 5 MG/ML IJ SOLN
10.0000 mg | Freq: Once | INTRAMUSCULAR | Status: AC
  Administered 2020-03-15: 10 mg via INTRAVENOUS
  Filled 2020-03-15: qty 2

## 2020-03-15 NOTE — ED Notes (Signed)
Pt reports her headache is better, reporting 0/10 pain but continues to refuse CT scan.

## 2020-03-15 NOTE — ED Triage Notes (Addendum)
Pt presents to ED POV. Pt c/o HA all day, now CP, abd pain, and hematemesis. Pt could not specify how much blood it was. NAD

## 2020-03-15 NOTE — ED Notes (Signed)
Pt reports some improvement with her headache. Reports she needs some more time before she is ready for CT. Will reassess

## 2020-03-15 NOTE — Discharge Instructions (Addendum)
No signs of heart attack today as the cause of your chest pain.  However there was concern that you could have a blood clot in your lung because of the coughing up blood and the pain in your chest and the abnormal blood test.  However at this time you do not want to have the CAT scan done because of the IV contrast.  If you do change your mind and want to have the CAT scan done you can always return to the emergency room or if you develop shortness of breath or have any episodes of passing out.  If you do have a blood clot in we do not find it there is a possibility he could get bigger causing chest pain, shortness of breath or death.  It will be very important for you to follow-up with your doctor next week especially if you continue to have the symptoms so they can recheck and see if there is any further testing that needs to be done or if you need to see a specialist.

## 2020-03-15 NOTE — ED Provider Notes (Signed)
[facility] EMERGENCY DEPARTMENT Provider Note   CSN: [id number] Arrival date & time: 03/15/20  0000     History Chief Complaint  Patient presents with  . Headache  . Hematemesis  . Chest Pain    Madison Miller is a 69 y.o. female.  HPI    Pt comes in with cc of chest pain, headaches and bloody spit. Pt has midsternal chest pain, off and on for 1 week. Pain is non radiating, unprovoked, sharp/heavy. Pain is worse with moving. No cough. Pt has CHF, it seems to her that it is in control. No weight gain or leg swelling. Pt started "spitting" blood today. No cough. Spit is dark red, not pink. No recent infection. Not on blood thinners. Pt has no hx of PE, DVT and denies any exogenous hormone (testosterone / estrogen) use, long distance travels or surgery in the past 6 weeks, active cancer, recent immobilization. Pt is not a smoker.  Past Medical History:  Diagnosis Date  . CHF (congestive heart failure) (HCC)   . GERD (gastroesophageal reflux disease)   . HTN (hypertension)   . Hyperparathyroidism   . Migraine, unspecified, with intractable migraine, so stated, without mention of status migrainosus   . Right bundle branch block and left anterior fascicular block   . SVT (supraventricular tachycardia) [facility])     Patient Active Problem List   Diagnosis Date Noted  . CHF (congestive heart failure) (HCC) 10/17/2017  . GERD (gastroesophageal reflux disease)   . HTN (hypertension) 06/11/2014  . ALLERGIC RHINITIS, SEASONAL 07/03/2008  . Intractable migraine 12/15/2006    Past Surgical History:  Procedure Laterality Date  . LUMBAR LAMINECTOMY  2010   Dr. Phoebe Perch  . PARATHYROIDECTOMY  2009     OB History   No obstetric history on file.     Family History  Problem Relation Age of Onset  . Other Mother        barin tumor  . Stroke Father   . Colon cancer Neg Hx   . Rectal cancer Neg Hx   . Stomach cancer Neg Hx     Social History   Tobacco  Use  . Smoking status: Never Smoker  . Smokeless tobacco: Never Used  Vaping Use  . Vaping Use: Never used  Substance Use Topics  . Alcohol use: No    Alcohol/week: 0.0 standard drinks  . Drug use: No    Home Medications Prior to Admission medications   Medication Sig Start Date End Date Taking? Authorizing Provider  acetaminophen (TYLENOL) 500 MG tablet Take 500 mg by mouth as needed for moderate pain or headache.    Yes [provider]  fluticasone (FLONASE) 50 MCG/ACT nasal spray SPRAY 2 SPRAYS INTO EACH NOSTRIL EVERY DAY Patient taking differently: Place 2 sprays into both nostrils as needed for allergies.  06/01/19  Yes Eustaquio Boyden, MD  furosemide (LASIX) 40 MG tablet TAKE 1 TABLET (40 MG TOTAL) BY MOUTH DAILY. MUST SCHEDULE PHYSICAL EXAM FOR REFILLS. Patient taking differently: Take 40 mg by mouth daily.  02/25/20  Yes Baity, Salvadore Oxford, NP  lisinopril (ZESTRIL) 5 MG tablet TAKE 1 TABLET (5 MG TOTAL) BY MOUTH DAILY. MUST SCHEDULE PHYSICAL 02/29/20  Yes Baity, Salvadore Oxford, NP  metoprolol succinate (TOPROL-XL) 25 MG 24 hr tablet TAKE 1/2 TABLET BY MOUTH EVERY DAY Patient taking differently: Take 12.5 mg by mouth as needed (heart failure).  03/21/19  Yes Lorre Munroe, NP    Allergies  Lactose intolerance (gi)  Review of Systems   Review of Systems  Constitutional: Positive for activity change.  Cardiovascular: Positive for chest pain.  Gastrointestinal: Negative for blood in stool, nausea and vomiting.  Hematological: Does not bruise/bleed easily.  All other systems reviewed and are negative.   Physical Exam Updated Vital Signs BP (!) 116/102   Pulse 71   Temp 98.2 F (36.8 C) (Oral)   Resp 16   Ht 5\' 4"  (1.626 m)   SpO2 100%   BMI 19.40 kg/m   Physical Exam Vitals and nursing note reviewed.  Constitutional:      Appearance: She is well-developed.  HENT:     Head: Normocephalic and atraumatic.  Cardiovascular:     Rate and Rhythm: Normal rate.    Pulmonary:     Effort: Pulmonary effort is normal.  Abdominal:     General: Bowel sounds are normal.  Musculoskeletal:     Cervical back: Normal range of motion and neck supple.  Skin:    General: Skin is warm and dry.  Neurological:     Mental Status: She is alert and oriented to person, place, and time.     ED Results / Procedures / Treatments   Labs (all labs ordered are listed, but only abnormal results are displayed) Labs Reviewed  COMPREHENSIVE METABOLIC PANEL - Abnormal; Notable for the following components:      Result Value   Glucose, Bld 105 (*)    GFR calc non Af Amer 59 (*)    All other components within normal limits  URINALYSIS, ROUTINE W REFLEX MICROSCOPIC - Abnormal; Notable for the following components:   Ketones, ur 5 (*)    Leukocytes,Ua TRACE (*)    Bacteria, UA RARE (*)    All other components within normal limits  D-DIMER, QUANTITATIVE (NOT AT [facility]) - Abnormal; Notable for the following components:   D-Dimer, Quant 2.89 (*)    All other components within normal limits  LIPASE, BLOOD  CBC  TROPONIN I (HIGH SENSITIVITY)  TROPONIN I (HIGH SENSITIVITY)    EKG EKG Interpretation  Date/Time:  Saturday March 15 2020 00:02:34 EDT Ventricular Rate:  74 PR Interval:  144 QRS Duration: 132 QT Interval:  422 QTC Calculation: 468 R Axis:   -83 Text Interpretation: Normal sinus rhythm Right bundle branch block Left anterior fascicular block Abnormal ECG No significant change since last tracing Reconfirmed by 07[birth year]1983 [phone]) on 03/15/2020 6:21:17 AM   Radiology DG Chest 2 View  Result Date: 03/15/2020 CLINICAL DATA:  Headache, chest pain, hematemesis EXAM: CHEST - 2 VIEW COMPARISON:  07/27/2019 FINDINGS: The heart size and mediastinal contours are within normal limits. Both lungs are clear. The visualized skeletal structures are unremarkable. IMPRESSION: No active cardiopulmonary disease. Electronically Signed   By: 13/02/2019 M.D.   On:  03/15/2020 00:52    Procedures Procedures (including critical care time)  Medications Ordered in ED Medications - No data to display  ED Course  I have reviewed the triage vital signs and the nursing notes.  Pertinent labs & imaging results that were available during my care of the patient were reviewed by me and considered in my medical decision making (see chart for details).    MDM Rules/Calculators/A&P                          69 year old female with history of CHF comes in a chief complaint of chest pain, headaches and spitting out  blood.  She denies any nausea, vomiting, hematemesis.  No bloody stools either.  She is having chest pain but no cough.  The chest pain is not pleuritic.  She reports that she has been spitting out blood, small amount but bright red and not blood-tinged.  Plan is to order basic labs and D-dimer.  If the D-dimer is positive then we will get a CT PE.  If the CT PE is negative then she will get GI follow-up and omeprazole.  Dr. Maryan Rued to follow-up on the results.  Final Clinical Impression(s) / ED Diagnoses Final diagnoses:  None    Rx / DC Orders ED Discharge Orders    None       Varney Biles, MD 03/15/20 [phone]

## 2020-03-15 NOTE — ED Provider Notes (Addendum)
Assumed care from Dr. Rhunette Croft at 7:30 AM.  Patient was scheduled to have a CT of her chest to rule out PE due to an elevated D-dimer.  Patient was taken to CT however she refused the scan stating she does not want the IV contrast or the radiation.  She is complaining of a headache and states her stat chest is still hurting some and she is having some sinus pressure.  Patient was given Tylenol and Reglan which did resolve her headache and discussed with her about getting the CT of her chest to rule out PE.  Patient does not want the CAT scan and does understand that we could be missing a blood clot and that this could worsen and cause morbidity and mortality but that this time does not want to seek further evaluation.  Will have patient follow-up with her doctor and will start on Prilosec in case there is some bleeding from her stomach.  Did encourage patient to return if she develops shortness of breath, passing out or other significant symptoms.   Gwyneth Sprout, MD 03/15/20 1201    Gwyneth Sprout, MD 03/15/20 1205

## 2020-03-20 ENCOUNTER — Encounter: Payer: Self-pay | Admitting: Internal Medicine

## 2020-03-20 ENCOUNTER — Ambulatory Visit (INDEPENDENT_AMBULATORY_CARE_PROVIDER_SITE_OTHER): Payer: Medicare Other | Admitting: Internal Medicine

## 2020-03-20 ENCOUNTER — Other Ambulatory Visit: Payer: Self-pay

## 2020-03-20 VITALS — BP 116/78 | HR 71 | Temp 97.1°F | Ht 63.0 in | Wt 113.0 lb

## 2020-03-20 DIAGNOSIS — K219 Gastro-esophageal reflux disease without esophagitis: Secondary | ICD-10-CM | POA: Diagnosis not present

## 2020-03-20 DIAGNOSIS — Z0000 Encounter for general adult medical examination without abnormal findings: Secondary | ICD-10-CM | POA: Diagnosis not present

## 2020-03-20 DIAGNOSIS — G43C1 Periodic headache syndromes in child or adult, intractable: Secondary | ICD-10-CM | POA: Diagnosis not present

## 2020-03-20 DIAGNOSIS — I509 Heart failure, unspecified: Secondary | ICD-10-CM | POA: Diagnosis not present

## 2020-03-20 NOTE — Assessment & Plan Note (Signed)
No issues Will monitor 

## 2020-03-20 NOTE — Assessment & Plan Note (Signed)
Compensated CMET reviewed Continue Lisinopril, Metoprolol and Furosemide

## 2020-03-20 NOTE — Assessment & Plan Note (Signed)
Avoid triggers Will monitor 

## 2020-03-20 NOTE — Progress Notes (Signed)
HPI:  Pt presents to the clinic today for her subsequent annual Medicare Wellness Exam. She is also due to follow up chronic conditions.  CHF: She denies chronic cough or SOB. Managed on Lisinopril, Metoprolol and Furosemide. Echo from 10/2012 reviewed.  GERD: She is not sure what triggers this.  She is not taking any medication for this. Upper GI from 03/2014 reviewed.  Migraines: She has not had a migraine lately. She is on Metoprolol for prevention. She takes Tylenol as needed with good relief of symptoms.  Hx of SVT: Managed on Metoprolol. ECG from 02/2020 reviewed.  Past Medical History:  Diagnosis Date  . CHF (congestive heart failure) (HCC)   . GERD (gastroesophageal reflux disease)   . HTN (hypertension)   . Hyperparathyroidism   . Migraine, unspecified, with intractable migraine, so stated, without mention of status migrainosus   . Right bundle branch block and left anterior fascicular block   . SVT (supraventricular tachycardia) (HCC)     Current Outpatient Medications  Medication Sig Dispense Refill  . acetaminophen (TYLENOL) 500 MG tablet Take 500 mg by mouth as needed for moderate pain or headache.     . fluticasone (FLONASE) 50 MCG/ACT nasal spray SPRAY 2 SPRAYS INTO EACH NOSTRIL EVERY DAY (Patient taking differently: Place 2 sprays into both nostrils as needed for allergies. ) 16 mL 1  . furosemide (LASIX) 40 MG tablet TAKE 1 TABLET (40 MG TOTAL) BY MOUTH DAILY. MUST SCHEDULE PHYSICAL EXAM FOR REFILLS. (Patient taking differently: Take 40 mg by mouth daily. ) 30 tablet 0  . lisinopril (ZESTRIL) 5 MG tablet TAKE 1 TABLET (5 MG TOTAL) BY MOUTH DAILY. MUST SCHEDULE PHYSICAL 90 tablet 0  . metoprolol succinate (TOPROL-XL) 25 MG 24 hr tablet TAKE 1/2 TABLET BY MOUTH EVERY DAY (Patient taking differently: Take 12.5 mg by mouth as needed (heart failure). ) 45 tablet 0   No current facility-administered medications for this visit.    Allergies  Allergen Reactions  . Lactose  Intolerance (Gi) Other (See Comments)    Pain,bloating    Family History  Problem Relation Age of Onset  . Other Mother        barin tumor  . Stroke Father   . Colon cancer Neg Hx   . Rectal cancer Neg Hx   . Stomach cancer Neg Hx     Social History   Socioeconomic History  . Marital status: Married    Spouse name: Not on file  . Number of children: 6  . Years of education: Not on file  . Highest education level: Not on file  Occupational History  . Not on file  Tobacco Use  . Smoking status: Never Smoker  . Smokeless tobacco: Never Used  Vaping Use  . Vaping Use: Never used  Substance and Sexual Activity  . Alcohol use: No    Alcohol/week: 0.0 standard drinks  . Drug use: No  . Sexual activity: Never  Other Topics Concern  . Not on file  Social History Narrative  . Not on file   Social Determinants of Health   Financial Resource Strain:   . Difficulty of Paying Living Expenses:   Food Insecurity:   . Worried About Programme researcher, broadcasting/film/video in the Last Year:   . Barista in the Last Year:   Transportation Needs:   . Freight forwarder (Medical):   Marland Kitchen Lack of Transportation (Non-Medical):   Physical Activity:   . Days of Exercise per Week:   .  Minutes of Exercise per Session:   Stress:   . Feeling of Stress :   Social Connections:   . Frequency of Communication with Friends and Family:   . Frequency of Social Gatherings with Friends and Family:   . Attends Religious Services:   . Active Member of Clubs or Organizations:   . Attends Banker Meetings:   Marland Kitchen Marital Status:   Intimate Partner Violence:   . Fear of Current or Ex-Partner:   . Emotionally Abused:   Marland Kitchen Physically Abused:   . Sexually Abused:     Hospitiliaztions: 02/2020, atypical chest pain, hemopthysis  Health Maintenance:    Flu: never  Tetanus: never  Pneumovax: never  Prevnar: never  Zostavax: never  Shingrix: never  Covid: never  Mammogram: unsure  Pap Smear:  unsure  Bone Density: unsure  Colon Screening: 04/2010  Eye Doctor: every 2 years  Dental Exam: as needed   Providers:   PCP: Nicki Reaper, NP  Podiatry: Dr. Logan Bores   I have personally reviewed and have noted:  1. The patient's medical and social history 2. Their use of alcohol, tobacco or illicit drugs 3. Their current medications and supplements 4. The patient's functional ability including ADL's, fall risks, home safety risks and hearing or visual impairment. 5. Diet and physical activities 6. Evidence for depression or mood disorder  Subjective:   Review of Systems:   Constitutional: Denies fever, malaise, fatigue, headache or abrupt weight changes.  HEENT: Denies eye pain, eye redness, ear pain, ringing in the ears, wax buildup, runny nose, nasal congestion, bloody nose, or sore throat. Respiratory: Denies difficulty breathing, shortness of breath, cough or sputum production.   Cardiovascular: Pt reports intermittent chest pain. Denies chest pain, chest tightness, palpitations or swelling in the hands or feet.  Gastrointestinal: Denies abdominal pain, bloating, constipation, diarrhea or blood in the stool.  GU: Denies urgency, frequency, pain with urination, burning sensation, blood in urine, odor or discharge. Musculoskeletal: Denies decrease in range of motion, difficulty with gait, muscle pain or joint pain and swelling.  Skin: Denies redness, rashes, lesions or ulcercations.  Neurological: Denies dizziness, difficulty with memory, difficulty with speech or problems with balance and coordination.  Psych: Denies anxiety, depression, SI/HI.  No other specific complaints in a complete review of systems (except as listed in HPI above).  Objective:  PE:   BP 116/78   Pulse 71   Temp (!) 97.1 F (36.2 C) (Temporal)   Ht 5\' 3"  (1.6 m)   Wt 113 lb (51.3 kg)   SpO2 98%   BMI 20.02 kg/m   Wt Readings from Last 3 Encounters:  02/28/20 113 lb (51.3 kg)  07/27/19 119 lb  4.3 oz (54.1 kg)  05/09/19 119 lb 4 oz (54.1 kg)    General: Appears her stated age, well developed, well nourished in NAD. Skin: Warm, dry and intact. No rashes noted. HEENT: Head: normal shape and size; Eyes: sclera white, no icterus, conjunctiva pink, PERRLA and EOMs intact;  Neck: Neck supple, trachea midline. No masses, lumps or thyromegaly present.  Cardiovascular: Normal rate and rhythm. S1,S2 noted.  No murmur, rubs or gallops noted. No JVD or BLE edema. No carotid bruits noted. Pulmonary/Chest: Normal effort and positive vesicular breath sounds. No respiratory distress. No wheezes, rales or ronchi noted.  Abdomen: Soft and nontender. Normal bowel sounds. No distention or masses noted. Liver, spleen and kidneys non palpable. Musculoskeletal:  Strength 5/5 BUE/BLE. No difficulty with gait. Neurological: Alert and oriented.  Cranial nerves II-XII grossly intact. Coordination normal.  Psychiatric: Mood and affect normal. Behavior is normal. Judgment and thought content normal.     BMET    Component Value Date/Time   NA 140 03/15/2020 0017   K 3.8 03/15/2020 0017   CL 102 03/15/2020 0017   CO2 28 03/15/2020 0017   GLUCOSE 105 (H) 03/15/2020 0017   BUN 17 03/15/2020 0017   CREATININE 0.98 03/15/2020 0017   CALCIUM 9.9 03/15/2020 0017   CALCIUM 12.0 (H) 11/24/2007 1700   GFRNONAA 59 (L) 03/15/2020 0017   GFRAA >60 03/15/2020 0017    Lipid Panel     Component Value Date/Time   CHOL 179 03/08/2017 1120   TRIG 120.0 03/08/2017 1120   HDL 50.10 03/08/2017 1120   CHOLHDL 4 03/08/2017 1120   VLDL 24.0 03/08/2017 1120   LDLCALC 105 (H) 03/08/2017 1120    CBC    Component Value Date/Time   WBC 8.3 03/15/2020 0017   RBC 4.33 03/15/2020 0017   HGB 13.0 03/15/2020 0017   HCT 40.6 03/15/2020 0017   PLT 249 03/15/2020 0017   MCV 93.8 03/15/2020 0017   MCH 30.0 03/15/2020 0017   MCHC 32.0 03/15/2020 0017   RDW 13.1 03/15/2020 0017   LYMPHSABS 0.8 06/17/2017 2306   MONOABS  0.3 06/17/2017 2306   EOSABS 0.0 06/17/2017 2306   BASOSABS 0.0 06/17/2017 2306    Hgb A1C Lab Results  Component Value Date   HGBA1C  11/23/2007    5.0 (NOTE)   The ADA recommends the following therapeutic goals for glycemic   control related to Hgb A1C measurement:   Goal of Therapy:   < 7.0% Hgb A1C   Action Suggested:  > 8.0% Hgb A1C   Ref:  Diabetes Care, 22, Suppl. 1, 1999      Assessment and Plan:   Medicare Annual Wellness Visit:  Diet: She does eat meat .She consumes fruits and veggies daily. She occasionally eats fried foods. She drinks mostly water and juice. Physical activity: Sedentary Depression/mood screen: Negative, PHQ 9 score of 0 Hearing: Intact to whispered voice Visual acuity: Grossly normal ADLs: Capable Fall risk: None Home safety: Good Cognitive evaluation: Intact to orientation, naming, recall and repetition EOL planning: No adv directives, full code/ I agree  Preventative Medicine: She declines flu, tetanus, pneumovax, prevnar, shingrix or covid vaccines. She declines pap smear, mammogram, colon cancer screening or bone density. Encouraged her to consume a balanaced diet and exercise regimen. Advised her to see an eye doctor and dentist annually. Labs reviewed, no additional albs indicated. Due date for screening exam given to patient as part of her AVS.   Next appointment: 1 year, Medicare Wellness Exam   Nicki Reaper, NP This visit occurred during the SARS-CoV-2 public health emergency.  Safety protocols were in place, including screening questions prior to the visit, additional usage of staff PPE, and extensive cleaning of exam room while observing appropriate contact time as indicated for disinfecting solutions.

## 2020-03-20 NOTE — Patient Instructions (Signed)

## 2020-03-22 ENCOUNTER — Other Ambulatory Visit: Payer: Self-pay | Admitting: Internal Medicine

## 2020-04-18 DIAGNOSIS — H5319 Other subjective visual disturbances: Secondary | ICD-10-CM | POA: Diagnosis not present

## 2020-05-22 ENCOUNTER — Other Ambulatory Visit: Payer: Self-pay | Admitting: Internal Medicine

## 2020-06-12 ENCOUNTER — Encounter: Payer: Self-pay | Admitting: Internal Medicine

## 2020-06-12 ENCOUNTER — Other Ambulatory Visit: Payer: Self-pay

## 2020-06-12 ENCOUNTER — Ambulatory Visit (INDEPENDENT_AMBULATORY_CARE_PROVIDER_SITE_OTHER): Payer: Medicare Other | Admitting: Internal Medicine

## 2020-06-12 VITALS — BP 118/78 | HR 68 | Temp 96.7°F | Wt 113.0 lb

## 2020-06-12 DIAGNOSIS — G43C1 Periodic headache syndromes in child or adult, intractable: Secondary | ICD-10-CM

## 2020-06-12 DIAGNOSIS — M21619 Bunion of unspecified foot: Secondary | ICD-10-CM | POA: Diagnosis not present

## 2020-06-12 DIAGNOSIS — I10 Essential (primary) hypertension: Secondary | ICD-10-CM | POA: Diagnosis not present

## 2020-06-12 DIAGNOSIS — B351 Tinea unguium: Secondary | ICD-10-CM | POA: Diagnosis not present

## 2020-06-12 DIAGNOSIS — I471 Supraventricular tachycardia: Secondary | ICD-10-CM

## 2020-06-12 DIAGNOSIS — I509 Heart failure, unspecified: Secondary | ICD-10-CM | POA: Diagnosis not present

## 2020-06-12 DIAGNOSIS — M2041 Other hammer toe(s) (acquired), right foot: Secondary | ICD-10-CM | POA: Diagnosis not present

## 2020-06-12 MED ORDER — METOPROLOL SUCCINATE ER 25 MG PO TB24: 12.5000 mg | ORAL_TABLET | ORAL | 1 refills | Status: DC | PRN

## 2020-06-12 NOTE — Progress Notes (Signed)
Subjective:    Patient ID: Madison Miller, female    DOB: 1951/03/25, 69 y.o.   MRN: [id number]  HPI   Pt presnts to the clinic with c/o right toe pain and deformity. She noticed this 1 week ago. She describes the pain as throbbing. The pain is worse when pressure is applied such as putting on her shoe or weight bearing. She has noticed some discoloration of the 3rd toe, right foot. She denies any injury to the area. She would like a referral to podiatry for further evaluation.  She would also like a refill of her Metoprolol today and referral to cardiology. She has a history of HTN, SVT and CHF. She is taking Lisinopril and Furosemide as prescribed. She reports she only takes the Metoprolol every so often (only when she feels bad) because taking it daily also makes her "feel bad". She denies lower extremity edema, cough, shortness of breath or chest pain. Echo from 10/2012 reviewed. ECG from 02/2020 reviewed.  She would also like a MRI of her brain. She reports intermittent headaches which are not new or worsening. She denies dizziness, visual changes, difficulty with memory, speech or coordination. She takes Tylenol as needed with good relief of her symptoms. She is not following with neurology at [facility] time.    Review of Systems      Past Medical History:  Diagnosis Date  . CHF (congestive heart failure) (HCC)   . GERD (gastroesophageal reflux disease)   . HTN (hypertension)   . Hyperparathyroidism   . Migraine, unspecified, with intractable migraine, so stated, without mention of status migrainosus   . Right bundle branch block and left anterior fascicular block   . SVT (supraventricular tachycardia) (HCC)     Current Outpatient Medications  Medication Sig Dispense Refill  . acetaminophen (TYLENOL) 500 MG tablet Take 500 mg by mouth as needed for moderate pain or headache.     . fluticasone (FLONASE) 50 MCG/ACT nasal spray SPRAY 2 SPRAYS INTO EACH NOSTRIL EVERY DAY (Patient  taking differently: Place 2 sprays into both nostrils as needed for allergies. ) 16 mL 1  . furosemide (LASIX) 40 MG tablet Take 1 tablet (40 mg total) by mouth daily. 90 tablet 2  . lisinopril (ZESTRIL) 5 MG tablet Take 1 tablet (5 mg total) by mouth daily. 90 tablet 2  . metoprolol succinate (TOPROL-XL) 25 MG 24 hr tablet TAKE 1/2 TABLET BY MOUTH EVERY DAY (Patient taking differently: Take 12.5 mg by mouth as needed (heart failure). ) 45 tablet 0   No current facility-administered medications for this visit.    Allergies  Allergen Reactions  . Lactose Intolerance (Gi) Other (See Comments)    Pain,bloating    Family History  Problem Relation Age of Onset  . Other Mother        barin tumor  . Stroke Father   . Colon cancer Neg Hx   . Rectal cancer Neg Hx   . Stomach cancer Neg Hx     Social History   Socioeconomic History  . Marital status: Married    Spouse name: Not on file  . Number of children: 6  . Years of education: Not on file  . Highest education level: Not on file  Occupational History  . Not on file  Tobacco Use  . Smoking status: Never Smoker  . Smokeless tobacco: Never Used  Vaping Use  . Vaping Use: Never used  Substance and Sexual Activity  . Alcohol use: No  Alcohol/week: 0.0 standard drinks  . Drug use: No  . Sexual activity: Never  Other Topics Concern  . Not on file  Social History Narrative  . Not on file   Social Determinants of Health   Financial Resource Strain:   . Difficulty of Paying Living Expenses: Not on file  Food Insecurity:   . Worried About Programme researcher, broadcasting/film/video in the Last Year: Not on file  . Ran Out of Food in the Last Year: Not on file  Transportation Needs:   . Lack of Transportation (Medical): Not on file  . Lack of Transportation (Non-Medical): Not on file  Physical Activity:   . Days of Exercise per Week: Not on file  . Minutes of Exercise per Session: Not on file  Stress:   . Feeling of Stress : Not on file    Social Connections:   . Frequency of Communication with Friends and Family: Not on file  . Frequency of Social Gatherings with Friends and Family: Not on file  . Attends Religious Services: Not on file  . Active Member of Clubs or Organizations: Not on file  . Attends Banker Meetings: Not on file  . Marital Status: Not on file  Intimate Partner Violence:   . Fear of Current or Ex-Partner: Not on file  . Emotionally Abused: Not on file  . Physically Abused: Not on file  . Sexually Abused: Not on file     Constitutional: Pt reports intermittent headaches.  Denies fever, malaise, fatigue,or abrupt weight changes.  Respiratory: Denies difficulty breathing, shortness of breath, cough or sputum production.   Cardiovascular: Denies chest pain, chest tightness, palpitations or swelling in the hands or feet. Musculoskeletal: Pt reports toe pain, right foot. Denies decrease in range of motion, difficulty with gait.  Skin: Pt reports discoloration of 3rd toe, right foot. Denies redness, rashes, lesions or ulcercations.  Neurological: Denies dizziness, difficulty with memory, difficulty with speech, numbness, tingling, weakness or problems with balance and coordination.    No other specific complaints in a complete review of systems (except as listed in HPI above).  Objective:   Physical Exam BP 118/78   Pulse 68   Temp (!) 96.7 F (35.9 C) (Temporal)   Wt 113 lb (51.3 kg)   SpO2 98%   BMI 20.02 kg/m   Wt Readings from Last 3 Encounters:  03/20/20 113 lb (51.3 kg)  02/28/20 113 lb (51.3 kg)  07/27/19 119 lb 4.3 oz (54.1 kg)    General: Appears older than her stated age,  in NAD. Skin: Warm, dry and intact. Hyperpigmentation noted of 3rd toe, right foot. Fungal toenails.  Cardiovascular: Normal rate and rhythm. S1,S2 noted.  No murmur, rubs or gallops noted. No JVD or BLE edema.  Pulmonary/Chest: Normal effort and positive vesicular breath sounds. No respiratory  distress. No wheezes, rales or ronchi noted.  Musculoskeletal: Pt has bilateral bunions. Hammer toes noted of bilateral feet.  No difficulty with gait.  Neurological: Alert and oriented.    BMET    Component Value Date/Time   NA 140 03/15/2020 0017   K 3.8 03/15/2020 0017   CL 102 03/15/2020 0017   CO2 28 03/15/2020 0017   GLUCOSE 105 (H) 03/15/2020 0017   BUN 17 03/15/2020 0017   CREATININE 0.98 03/15/2020 0017   CALCIUM 9.9 03/15/2020 0017   CALCIUM 12.0 (H) 11/24/2007 1700   GFRNONAA 59 (L) 03/15/2020 0017   GFRAA >60 03/15/2020 0017    Lipid Panel  Component Value Date/Time   CHOL 179 03/08/2017 1120   TRIG 120.0 03/08/2017 1120   HDL 50.10 03/08/2017 1120   CHOLHDL 4 03/08/2017 1120   VLDL 24.0 03/08/2017 1120   LDLCALC 105 (H) 03/08/2017 1120    CBC    Component Value Date/Time   WBC 8.3 03/15/2020 0017   RBC 4.33 03/15/2020 0017   HGB 13.0 03/15/2020 0017   HCT 40.6 03/15/2020 0017   PLT 249 03/15/2020 0017   MCV 93.8 03/15/2020 0017   MCH 30.0 03/15/2020 0017   MCHC 32.0 03/15/2020 0017   RDW 13.1 03/15/2020 0017   LYMPHSABS 0.8 06/17/2017 2306   MONOABS 0.3 06/17/2017 2306   EOSABS 0.0 06/17/2017 2306   BASOSABS 0.0 06/17/2017 2306    Hgb A1C Lab Results  Component Value Date   HGBA1C  11/23/2007    5.0 (NOTE)   The ADA recommends the following therapeutic goals for glycemic   control related to Hgb A1C measurement:   Goal of Therapy:   < 7.0% Hgb A1C   Action Suggested:  > 8.0% Hgb A1C   Ref:  Diabetes Care, 22, Suppl. 1, 1999           Assessment & Plan:  Bunion, Hammer Toes, Fungal Nails b/l Feet:  Referral to podiatry per patient request  HTN, SVT, CHF:  BP controlled, CHF compensated Metoprolol refilled- this is XL, advised her she should not be cutting this in half but reports this is how cardiology prescribed years ago Referral to cardiology placed per patient request  Migraines:  No new or worsening features- no  indication for MRI at this time Continue Tylenol Will monitor  Return precautions discussed Nicki Reaper, NP This visit occurred during the SARS-CoV-2 public health emergency.  Safety protocols were in place, including screening questions prior to the visit, additional usage of staff PPE, and extensive cleaning of exam room while observing appropriate contact time as indicated for disinfecting solutions.

## 2020-06-12 NOTE — Patient Instructions (Signed)
Toe Deformity Repair  Toe deformity repair is a surgery to reposition a toe that stays bent in an unnatural position. For example, a hammer toe is a deformity that causes the middle joint of a toe to stay bent. A toe deformity can cause pain and may interfere with walking. You may need surgery if other treatments have not helped to straighten the toe and relieve your symptoms. Tell a health care provider about:  Any allergies you have.  All medicines you are taking, including vitamins, herbs, eye drops, creams, and over-the-counter medicines.  Any problems you or family members have had with anesthetic medicines.  Any blood disorders you have.  Any surgeries you have had.  Any medical conditions you have.  Whether you are pregnant or may be pregnant. What are the risks? Generally, this is a safe procedure. However, problems may occur, including:  Infection.  Bleeding.  Allergic reactions to medicines.  Damage to nerves.  Swelling.  Pain.  Numbness.  Scarring.  Toe positioning that is not straight (poor toe alignment). What happens before the procedure? Staying hydrated Follow instructions from your health care provider about hydration, which may include:  Up to 2 hours before the procedure - you may continue to drink clear liquids, such as water, clear fruit juice, black coffee, and plain tea.  Eating and drinking restrictions Follow instructions from your health care provider about eating and drinking, which may include:  8 hours before the procedure - stop eating heavy meals or foods such as meat, fried foods, or fatty foods.  6 hours before the procedure - stop eating light meals or foods, such as toast or cereal.  6 hours before the procedure - stop drinking milk or drinks that contain milk.  2 hours before the procedure - stop drinking clear liquids. Medicines  Ask your health care provider about: ? Changing or stopping your regular medicines. This is  especially important if you are taking diabetes medicines or blood thinners. ? Taking medicines such as aspirin and ibuprofen. These medicines can thin your blood. Do not take these medicines unless your health care provider tells you to take them. ? Taking over-the-counter medicines, vitamins, herbs, and supplements.  You may be given antibiotic medicine to help prevent an infection. General instructions  Plan to have someone take you home from the hospital or clinic.  Plan to have a responsible adult care for you for at least 24 hours after you leave the hospital or clinic. This is important.  Ask your health care provider how your surgical site will be marked or identified.  You may be asked to shower with a germ-killing soap. What happens during the procedure?   To lower your risk of infection: ? Your health care team will wash or sanitize their hands. ? Hair may be removed from the surgical area. ? Your skin will be washed with soap.  An IV will be inserted into one of your veins.  You will be given one of the following: ? A medicine to help you relax (sedative). ? A medicine to numb the area (local anesthetic). The medicine will be injected directly into your toe. ? A medicine to make you fall asleep (general anesthetic).  The surgeon will make one or more incisions on your affected toe.  If extra bone or extra soft tissue is causing the deformity, it will be removed.  If connective tissues such as tendons or ligaments are causing the deformity, they will be removed or relocated.  Surgical pins or screws will be placed to hold your toe in place during the healing process.  The incisions will be closed with stitches (sutures).  A bandage (dressing) will be placed over the incision area. The procedure may vary among health care providers and hospitals. What happens after the procedure?  Your blood pressure, heart rate, breathing rate, and blood oxygen level will be  monitored until the medicines you were given have worn off.  You will be given pain medicine as needed.  You may have a post-operative shoe placed on your foot.  Do not drive for 24 hours if you were given a sedative during your procedure. Summary  A toe that stays bent in an unnatural position can be repaired with surgery (toe deformity repair).  You may need surgery if other treatments have not helped to straighten the toe and relieve your symptoms.  Before the procedure, follow instructions from your health care provider about eating and drinking.  During the procedure, you may be given a medicine to numb the toe area or a medicine to make you fall asleep. This information is not intended to replace advice given to you by your health care provider. Make sure you discuss any questions you have with your health care provider. Document Revised: 12/27/2018 Document Reviewed: 05/17/2017 Elsevier Patient Education  2020 [employer].

## 2020-06-24 ENCOUNTER — Ambulatory Visit (INDEPENDENT_AMBULATORY_CARE_PROVIDER_SITE_OTHER)
Admission: RE | Admit: 2020-06-24 | Discharge: 2020-06-24 | Disposition: A | Discharge status: Home / Self Care | Payer: Medicare Other | Source: Ambulatory Visit | Attending: Internal Medicine | Admitting: Internal Medicine

## 2020-06-24 ENCOUNTER — Encounter: Payer: Self-pay | Admitting: Internal Medicine

## 2020-06-24 ENCOUNTER — Other Ambulatory Visit: Payer: Self-pay

## 2020-06-24 ENCOUNTER — Ambulatory Visit (INDEPENDENT_AMBULATORY_CARE_PROVIDER_SITE_OTHER): Payer: Medicare Other | Admitting: Internal Medicine

## 2020-06-24 VITALS — BP 116/72 | HR 68 | Temp 96.9°F | Wt 115.0 lb

## 2020-06-24 DIAGNOSIS — M25511 Pain in right shoulder: Secondary | ICD-10-CM | POA: Diagnosis not present

## 2020-06-24 MED ORDER — DEXAMETHASONE SODIUM PHOSPHATE 100 MG/10ML IJ SOLN
10.0000 mg | Freq: Once | INTRAMUSCULAR | Status: AC
  Administered 2020-06-24: 10 mg via INTRAMUSCULAR

## 2020-06-24 NOTE — Addendum Note (Signed)
Addended by: Roena Malady on: 06/24/2020 03:08 PM   Modules accepted: Orders

## 2020-06-24 NOTE — Patient Instructions (Signed)
Shoulder Exercises Ask your health care provider which exercises are safe for you. Do exercises exactly as told by your health care provider and adjust them as directed. It is normal to feel mild stretching, pulling, tightness, or discomfort as you do these exercises. Stop right away if you feel sudden pain or your pain gets worse. Do not begin these exercises until told by your health care provider. Stretching exercises External rotation and abduction This exercise is sometimes called corner stretch. This exercise rotates your arm outward (external rotation) and moves your arm out from your body (abduction). 1. Stand in a doorway with one of your feet slightly in front of the other. This is called a staggered stance. If you cannot reach your forearms to the door frame, stand facing a corner of a room. 2. Choose one of the following positions as told by your health care provider: ? Place your hands and forearms on the door frame above your head. ? Place your hands and forearms on the door frame at the height of your head. ? Place your hands on the door frame at the height of your elbows. 3. Slowly move your weight onto your front foot until you feel a stretch across your chest and in the front of your shoulders. Keep your head and chest upright and keep your abdominal muscles tight. 4. Hold for __________ seconds. 5. To release the stretch, shift your weight to your back foot. Repeat __________ times. Complete this exercise __________ times a day. Extension, standing 1. Stand and hold a broomstick, a cane, or a similar object behind your back. ? Your hands should be a little wider than shoulder width apart. ? Your palms should face away from your back. 2. Keeping your elbows straight and your shoulder muscles relaxed, move the stick away from your body until you feel a stretch in your shoulders (extension). ? Avoid shrugging your shoulders while you move the stick. Keep your shoulder blades tucked  down toward the middle of your back. 3. Hold for __________ seconds. 4. Slowly return to the starting position. Repeat __________ times. Complete this exercise __________ times a day. Range-of-motion exercises Pendulum  1. Stand near a wall or a surface that you can hold onto for balance. 2. Bend at the waist and let your left / right arm hang straight down. Use your other arm to support you. Keep your back straight and do not lock your knees. 3. Relax your left / right arm and shoulder muscles, and move your hips and your trunk so your left / right arm swings freely. Your arm should swing because of the motion of your body, not because you are using your arm or shoulder muscles. 4. Keep moving your hips and trunk so your arm swings in the following directions, as told by your health care provider: ? Side to side. ? Forward and backward. ? In clockwise and counterclockwise circles. 5. Continue each motion for __________ seconds, or for as long as told by your health care provider. 6. Slowly return to the starting position. Repeat __________ times. Complete this exercise __________ times a day. Shoulder flexion, standing  1. Stand and hold a broomstick, a cane, or a similar object. Place your hands a little more than shoulder width apart on the object. Your left / right hand should be palm up, and your other hand should be palm down. 2. Keep your elbow straight and your shoulder muscles relaxed. Push the stick up with your healthy arm to   raise your left / right arm in front of your body, and then over your head until you feel a stretch in your shoulder (flexion). ? Avoid shrugging your shoulder while you raise your arm. Keep your shoulder blade tucked down toward the middle of your back. 3. Hold for __________ seconds. 4. Slowly return to the starting position. Repeat __________ times. Complete this exercise __________ times a day. Shoulder abduction, standing 1. Stand and hold a broomstick,  a cane, or a similar object. Place your hands a little more than shoulder width apart on the object. Your left / right hand should be palm up, and your other hand should be palm down. 2. Keep your elbow straight and your shoulder muscles relaxed. Push the object across your body toward your left / right side. Raise your left / right arm to the side of your body (abduction) until you feel a stretch in your shoulder. ? Do not raise your arm above shoulder height unless your health care provider tells you to do that. ? If directed, raise your arm over your head. ? Avoid shrugging your shoulder while you raise your arm. Keep your shoulder blade tucked down toward the middle of your back. 3. Hold for __________ seconds. 4. Slowly return to the starting position. Repeat __________ times. Complete this exercise __________ times a day. Internal rotation  1. Place your left / right hand behind your back, palm up. 2. Use your other hand to dangle an exercise band, a towel, or a similar object over your shoulder. Grasp the band with your left / right hand so you are holding on to both ends. 3. Gently pull up on the band until you feel a stretch in the front of your left / right shoulder. The movement of your arm toward the center of your body is called internal rotation. ? Avoid shrugging your shoulder while you raise your arm. Keep your shoulder blade tucked down toward the middle of your back. 4. Hold for __________ seconds. 5. Release the stretch by letting go of the band and lowering your hands. Repeat __________ times. Complete this exercise __________ times a day. Strengthening exercises External rotation  1. Sit in a stable chair without armrests. 2. Secure an exercise band to a stable object at elbow height on your left / right side. 3. Place a soft object, such as a folded towel or a small pillow, between your left / right upper arm and your body to move your elbow about 4 inches (10 cm) away  from your side. 4. Hold the end of the exercise band so it is tight and there is no slack. 5. Keeping your elbow pressed against the soft object, slowly move your forearm out, away from your abdomen (external rotation). Keep your body steady so only your forearm moves. 6. Hold for __________ seconds. 7. Slowly return to the starting position. Repeat __________ times. Complete this exercise __________ times a day. Shoulder abduction  1. Sit in a stable chair without armrests, or stand up. 2. Hold a __________ weight in your left / right hand, or hold an exercise band with both hands. 3. Start with your arms straight down and your left / right palm facing in, toward your body. 4. Slowly lift your left / right hand out to your side (abduction). Do not lift your hand above shoulder height unless your health care provider tells you that this is safe. ? Keep your arms straight. ? Avoid shrugging your shoulder while you   do this movement. Keep your shoulder blade tucked down toward the middle of your back. 5. Hold for __________ seconds. 6. Slowly lower your arm, and return to the starting position. Repeat __________ times. Complete this exercise __________ times a day. Shoulder extension 1. Sit in a stable chair without armrests, or stand up. 2. Secure an exercise band to a stable object in front of you so it is at shoulder height. 3. Hold one end of the exercise band in each hand. Your palms should face each other. 4. Straighten your elbows and lift your hands up to shoulder height. 5. Step back, away from the secured end of the exercise band, until the band is tight and there is no slack. 6. Squeeze your shoulder blades together as you pull your hands down to the sides of your thighs (extension). Stop when your hands are straight down by your sides. Do not let your hands go behind your body. 7. Hold for __________ seconds. 8. Slowly return to the starting position. Repeat __________ times.  Complete this exercise __________ times a day. Shoulder row 1. Sit in a stable chair without armrests, or stand up. 2. Secure an exercise band to a stable object in front of you so it is at waist height. 3. Hold one end of the exercise band in each hand. Position your palms so that your thumbs are facing the ceiling (neutral position). 4. Bend each of your elbows to a 90-degree angle (right angle) and keep your upper arms at your sides. 5. Step back until the band is tight and there is no slack. 6. Slowly pull your elbows back behind you. 7. Hold for __________ seconds. 8. Slowly return to the starting position. Repeat __________ times. Complete this exercise __________ times a day. Shoulder press-ups  1. Sit in a stable chair that has armrests. Sit upright, with your feet flat on the floor. 2. Put your hands on the armrests so your elbows are bent and your fingers are pointing forward. Your hands should be about even with the sides of your body. 3. Push down on the armrests and use your arms to lift yourself off the chair. Straighten your elbows and lift yourself up as much as you comfortably can. ? Move your shoulder blades down, and avoid letting your shoulders move up toward your ears. ? Keep your feet on the ground. As you get stronger, your feet should support less of your body weight as you lift yourself up. 4. Hold for __________ seconds. 5. Slowly lower yourself back into the chair. Repeat __________ times. Complete this exercise __________ times a day. Wall push-ups  1. Stand so you are facing a stable wall. Your feet should be about one arm-length away from the wall. 2. Lean forward and place your palms on the wall at shoulder height. 3. Keep your feet flat on the floor as you bend your elbows and lean forward toward the wall. 4. Hold for __________ seconds. 5. Straighten your elbows to push yourself back to the starting position. Repeat __________ times. Complete this exercise  __________ times a day. This information is not intended to replace advice given to you by your health care provider. Make sure you discuss any questions you have with your health care provider. Document Revised: 12/29/2018 Document Reviewed: 10/06/2018 Elsevier Patient Education  2020 [employer]  

## 2020-06-24 NOTE — Progress Notes (Signed)
Subjective:    Patient ID: Madison Miller, female    DOB: May 31, 1951, 69 y.o.   MRN: [id number]  HPI  Pt presents to the clinic today with c/o right shoulder pain. This started 2 days ago. She describes the pain as achy and sore. The pain does not radiate. She denies numbness, tingling or weakness of the right upper extremity. She denies any injury that she is aware of. She has tried Tylenol OTC with minimal relief.  Review of Systems      Past Medical History:  Diagnosis Date  . CHF (congestive heart failure) (HCC)   . GERD (gastroesophageal reflux disease)   . HTN (hypertension)   . Hyperparathyroidism   . Migraine, unspecified, with intractable migraine, so stated, without mention of status migrainosus   . Right bundle branch block and left anterior fascicular block   . SVT (supraventricular tachycardia) (HCC)     Current Outpatient Medications  Medication Sig Dispense Refill  . acetaminophen (TYLENOL) 500 MG tablet Take 500 mg by mouth as needed for moderate pain or headache.     . fluticasone (FLONASE) 50 MCG/ACT nasal spray SPRAY 2 SPRAYS INTO EACH NOSTRIL EVERY DAY (Patient taking differently: Place 2 sprays into both nostrils as needed for allergies. ) 16 mL 1  . furosemide (LASIX) 40 MG tablet Take 1 tablet (40 mg total) by mouth daily. 90 tablet 2  . lisinopril (ZESTRIL) 5 MG tablet Take 1 tablet (5 mg total) by mouth daily. 90 tablet 2  . metoprolol succinate (TOPROL-XL) 25 MG 24 hr tablet Take 0.5 tablets (12.5 mg total) by mouth as needed (heart failure). 30 tablet 1   No current facility-administered medications for this visit.    Allergies  Allergen Reactions  . Lactose Intolerance (Gi) Other (See Comments)    Pain,bloating    Family History  Problem Relation Age of Onset  . Other Mother        barin tumor  . Stroke Father   . Colon cancer Neg Hx   . Rectal cancer Neg Hx   . Stomach cancer Neg Hx     Social History   Socioeconomic History  .  Marital status: Married    Spouse name: Not on file  . Number of children: 6  . Years of education: Not on file  . Highest education level: Not on file  Occupational History  . Not on file  Tobacco Use  . Smoking status: Never Smoker  . Smokeless tobacco: Never Used  Vaping Use  . Vaping Use: Never used  Substance and Sexual Activity  . Alcohol use: No    Alcohol/week: 0.0 standard drinks  . Drug use: No  . Sexual activity: Never  Other Topics Concern  . Not on file  Social History Narrative  . Not on file   Social Determinants of Health   Financial Resource Strain:   . Difficulty of Paying Living Expenses: Not on file  Food Insecurity:   . Worried About Programme researcher, broadcasting/film/video in the Last Year: Not on file  . Ran Out of Food in the Last Year: Not on file  Transportation Needs:   . Lack of Transportation (Medical): Not on file  . Lack of Transportation (Non-Medical): Not on file  Physical Activity:   . Days of Exercise per Week: Not on file  . Minutes of Exercise per Session: Not on file  Stress:   . Feeling of Stress : Not on file  Social Connections:   .  Frequency of Communication with Friends and Family: Not on file  . Frequency of Social Gatherings with Friends and Family: Not on file  . Attends Religious Services: Not on file  . Active Member of Clubs or Organizations: Not on file  . Attends Banker Meetings: Not on file  . Marital Status: Not on file  Intimate Partner Violence:   . Fear of Current or Ex-Partner: Not on file  . Emotionally Abused: Not on file  . Physically Abused: Not on file  . Sexually Abused: Not on file     Constitutional: Denies fever, malaise, fatigue, headache or abrupt weight changes.  Respiratory: Denies difficulty breathing, shortness of breath, cough or sputum production.   Cardiovascular: Denies chest pain, chest tightness, palpitations or swelling in the hands or feet.  Musculoskeletal: Patient reports right shoulder  pain, decreased range of motion.  Denies difficulty with gait, muscle pain or joint swelling.  Skin: Denies redness, rashes, lesions or ulcercations.  Neurological: Denies numbness, tingling, weakness or problems with balance and coordination.    No other specific complaints in a complete review of systems (except as listed in HPI above).  Objective:   Physical Exam  BP 116/72   Pulse 68   Temp (!) 96.9 F (36.1 C) (Temporal)   Wt 115 lb (52.2 kg)   SpO2 98%   BMI 20.37 kg/m   Wt Readings from Last 3 Encounters:  06/12/20 113 lb (51.3 kg)  03/20/20 113 lb (51.3 kg)  02/28/20 113 lb (51.3 kg)    General: Appears her stated age, well developed, well nourished in NAD. Skin: Warm, dry and intact. No rashes noted. Cardiovascular: Normal rate and rhythm.  Pulmonary/Chest: Normal effort and positive vesicular breath sounds. No respiratory distress. No wheezes, rales or ronchi noted.  Musculoskeletal: Normal flexion and rotation of the cervical spine.  Decreased extension of the cervical spine secondary to pain.  No bony tenderness noted over the cervical spine.  Decreased active external and internal rotation secondary to pain.  Normal passive external and internal rotation of the right shoulder.  Pain with palpation of the right AC joint and anterior biceps tendon.  Negative drop can test on the right.  Strength 4/5 RUE, 5/5 LUE.  Handgrips equal. Neurological: Alert and oriented.  Coordination normal.    BMET    Component Value Date/Time   NA 140 03/15/2020 0017   K 3.8 03/15/2020 0017   CL 102 03/15/2020 0017   CO2 28 03/15/2020 0017   GLUCOSE 105 (H) 03/15/2020 0017   BUN 17 03/15/2020 0017   CREATININE 0.98 03/15/2020 0017   CALCIUM 9.9 03/15/2020 0017   CALCIUM 12.0 (H) 11/24/2007 1700   GFRNONAA 59 (L) 03/15/2020 0017   GFRAA >60 03/15/2020 0017    Lipid Panel     Component Value Date/Time   CHOL 179 03/08/2017 1120   TRIG 120.0 03/08/2017 1120   HDL 50.10  03/08/2017 1120   CHOLHDL 4 03/08/2017 1120   VLDL 24.0 03/08/2017 1120   LDLCALC 105 (H) 03/08/2017 1120    CBC    Component Value Date/Time   WBC 8.3 03/15/2020 0017   RBC 4.33 03/15/2020 0017   HGB 13.0 03/15/2020 0017   HCT 40.6 03/15/2020 0017   PLT 249 03/15/2020 0017   MCV 93.8 03/15/2020 0017   MCH 30.0 03/15/2020 0017   MCHC 32.0 03/15/2020 0017   RDW 13.1 03/15/2020 0017   LYMPHSABS 0.8 06/17/2017 2306   MONOABS 0.3 06/17/2017 2306  EOSABS 0.0 06/17/2017 2306   BASOSABS 0.0 06/17/2017 2306    Hgb A1C Lab Results  Component Value Date   HGBA1C  11/23/2007    5.0 (NOTE)   The ADA recommends the following therapeutic goals for glycemic   control related to Hgb A1C measurement:   Goal of Therapy:   < 7.0% Hgb A1C   Action Suggested:  > 8.0% Hgb A1C   Ref:  Diabetes Care, 22, Suppl. 1, 1999           Assessment & Plan:  Acute Right Shoulder Pain:  Decadron 10 mg IM today Xray right shoulder today Stretching exercises given Encouraged ice for 10 min 2 x day Continue Tylenol prn  Will follow up after xray, return precautions discussed  Nicki Reaper, NP This visit occurred during the SARS-CoV-2 public health emergency.  Safety protocols were in place, including screening questions prior to the visit, additional usage of staff PPE, and extensive cleaning of exam room while observing appropriate contact time as indicated for disinfecting solutions.

## 2020-07-03 ENCOUNTER — Ambulatory Visit (INDEPENDENT_AMBULATORY_CARE_PROVIDER_SITE_OTHER): Payer: Medicare Other

## 2020-07-03 ENCOUNTER — Ambulatory Visit (INDEPENDENT_AMBULATORY_CARE_PROVIDER_SITE_OTHER): Payer: Medicare Other | Admitting: Podiatry

## 2020-07-03 ENCOUNTER — Encounter: Payer: Self-pay | Admitting: Podiatry

## 2020-07-03 ENCOUNTER — Other Ambulatory Visit: Payer: Self-pay

## 2020-07-03 DIAGNOSIS — M2042 Other hammer toe(s) (acquired), left foot: Secondary | ICD-10-CM

## 2020-07-03 DIAGNOSIS — M2010 Hallux valgus (acquired), unspecified foot: Secondary | ICD-10-CM

## 2020-07-03 DIAGNOSIS — M2041 Other hammer toe(s) (acquired), right foot: Secondary | ICD-10-CM

## 2020-07-03 DIAGNOSIS — M7751 Other enthesopathy of right foot: Secondary | ICD-10-CM | POA: Diagnosis not present

## 2020-07-03 NOTE — Progress Notes (Signed)
Subjective:  Patient ID: Madison Miller, female    DOB: 01-19-1951,  MRN: [id number]  Chief Complaint  Patient presents with  . Hammer Toe    patient presents today for hammertoe, bunions bilat, sometimes has pain, painful toenails left 4th and right 3rd  . Bunions    69 y.o. female presents with the above complaint.  Patient presents with complaint of right third toe pain.  Patient states is painful to touch.  Patient states that there is some squeezing associated with it leading to pain.  She states that she has not tried offloading.  She denies any history of rheumatoid arthritis however she does have ulnar deviation of the toes without any history of rheumatoid arthritis.  She denies any other acute complaints.  She would like to discuss treatment options.  She states that the bunions and hammertoes have been very painful to touch.  She denies any other acute complaints.  She does not want any surgery.   Review of Systems: Negative except as noted in the HPI. Denies N/V/F/Ch.  Past Medical History:  Diagnosis Date  . CHF (congestive heart failure) (HCC)   . GERD (gastroesophageal reflux disease)   . HTN (hypertension)   . Hyperparathyroidism   . Migraine, unspecified, with intractable migraine, so stated, without mention of status migrainosus   . Right bundle branch block and left anterior fascicular block   . SVT (supraventricular tachycardia) (HCC)     Current Outpatient Medications:  .  acetaminophen (TYLENOL) 500 MG tablet, Take 500 mg by mouth as needed for moderate pain or headache. , Disp: , Rfl:  .  fluticasone (FLONASE) 50 MCG/ACT nasal spray, SPRAY 2 SPRAYS INTO EACH NOSTRIL EVERY DAY (Patient taking differently: Place 2 sprays into both nostrils as needed for allergies. ), Disp: 16 mL, Rfl: 1 .  furosemide (LASIX) 40 MG tablet, Take 1 tablet (40 mg total) by mouth daily., Disp: 90 tablet, Rfl: 2 .  lisinopril (ZESTRIL) 5 MG tablet, Take 1 tablet (5 mg total) by  mouth daily., Disp: 90 tablet, Rfl: 2 .  metoprolol succinate (TOPROL-XL) 25 MG 24 hr tablet, Take 0.5 tablets (12.5 mg total) by mouth as needed (heart failure)., Disp: 30 tablet, Rfl: 1  Social History   Tobacco Use  Smoking Status Never Smoker  Smokeless Tobacco Never Used    Allergies  Allergen Reactions  . Lactose Intolerance (Gi) Other (See Comments)    Pain,bloating   Objective:  There were no vitals filed for this visit. There is no height or weight on file to calculate BMI. Constitutional Well developed. Well nourished.  Vascular Dorsalis pedis pulses palpable bilaterally. Posterior tibial pulses palpable bilaterally. Capillary refill normal to all digits.  No cyanosis or clubbing noted. Pedal hair growth normal.  Neurologic Normal speech. Oriented to person, place, and time. Epicritic sensation to light touch grossly present bilaterally.  Dermatologic Nails well groomed and normal in appearance. No open wounds. No skin lesions.  Orthopedic:  Pain on palpation to bilateral bunion deformity that appears to be severe in nature.  This is a track bound deformity nontracking deformity.  There is mild crepitus to the first metatarsophalangeal joint as well.  Hammertoe contracture rigid with lateral deviation noted to bilateral toes.  Pain on palpation to right third digit PIPJ joint.  Pain with range of motion of the joint.  No pain at the MPJ joint of the thyroid.   Radiographs: 3 views of skeletally mature adult bilateral foot: There is decreasing calcaneal  inclination angle increase in talar declination angle anterior break in the cyma line.  There is ulnar deviation of the digits with hammertoe contractures noted.  There is subluxation of the first metatarsophalangeal joint with severe nature of the bunion deformity.  These findings are consistent with rheumatoid foot. Assessment:   1. Valgus deformity of great toe, unspecified laterality   2. Hammertoes of both feet     Plan:  Patient was evaluated and treated and all questions answered.  Right third digit PIPJ capsulitis -I explained patient the etiology of capsulitis versus treatment options were discussed.  I believe patient will benefit from a steroid injection to help decrease acute inflammatory component associated pain.  Patient agrees with the plan would like to proceed with a steroid injection. -A steroid injection was performed at right third PIPJ joint using 1% plain Lidocaine and 10 mg of Kenalog. This was well tolerated.  Bilateral bunions/hammertoes with underlying rheumatoid foot -I explained the patient the etiology of rheumatoid foot with development of bunions and hammertoes and various treatment options were discussed.  At this time patient only wants to discuss conservative care nonsurgical.  I discussed with her shoe gear modification including obtain new balance extrawide sneakers.  Patient states understanding.  She does not have any history that she knows of of rheumatoid arthritis.  She has not been worked up for rheumatoid arthritis.  At this time patient is not interested in follow-up with a rheumatologist  No follow-ups on file.

## 2020-07-16 ENCOUNTER — Telehealth: Payer: Self-pay

## 2020-07-16 NOTE — Telephone Encounter (Signed)
Agree with advice given

## 2020-07-16 NOTE — Telephone Encounter (Signed)
Pt calling and wants to know if she should get her arteries cked;pt had med wellness 03/20/20 and pt has not had a problem was just thinking might should get arteries cked. Then pt requested a cardiac referral and pt said starting last wk on and off pt has had lt side of chest tightness and dull pain. Last time occurred was 2 days ago. No problems now and no SOB.Pt has no covid symptoms and no known exposure to + covid. Offered pt appts sooner than 07/21/20 but pt said no she wants to wait until next wk to be seen pt scheduled 30' in office appt on 07/21/20 UC & ED precautions given and pt voiced understanding. Sending note to Pamala Hurry NP.

## 2020-07-21 ENCOUNTER — Ambulatory Visit: Payer: Medicare Other | Admitting: Internal Medicine

## 2020-07-21 ENCOUNTER — Other Ambulatory Visit: Payer: Self-pay

## 2020-07-24 ENCOUNTER — Ambulatory Visit: Payer: Medicare Other | Admitting: Internal Medicine

## 2020-07-31 ENCOUNTER — Ambulatory Visit (INDEPENDENT_AMBULATORY_CARE_PROVIDER_SITE_OTHER): Payer: Medicare Other | Admitting: Internal Medicine

## 2020-07-31 ENCOUNTER — Other Ambulatory Visit: Payer: Self-pay

## 2020-07-31 VITALS — BP 110/70 | HR 66 | Temp 97.1°F | Wt 114.0 lb

## 2020-07-31 DIAGNOSIS — R0789 Other chest pain: Secondary | ICD-10-CM

## 2020-07-31 DIAGNOSIS — R252 Cramp and spasm: Secondary | ICD-10-CM | POA: Diagnosis not present

## 2020-07-31 NOTE — Progress Notes (Signed)
Subjective:    Patient ID: Madison Miller, female    DOB: [birth year], 69 y.o.   MRN: [id number]  HPI  Pt presents to the clinic today with c/o chest tightness.  She reports this started back in June, had ER visit for the same.  Labs at that time were unremarkable.  Chest x-ray was normal.  ECG was unchanged from prior.  She reports this can occur with exertion and rest.  She reports the pain is not worse with exertion.  The pain does not radiate.  She denies associated dizziness, visual changes, shortness of breath, nausea, sweating or syncope.  She reports this occurs about 1 time a week and last for about 15 minutes before resolving.  She reports she will take metoprolol and her symptoms will improve.  She reports she cannot take metoprolol daily because it makes her feel so bad.  She denies feeling anxious or overwhelmed.  She denies reflux or heartburn.  She is also concerned about having arterial disease.  She reports muscle cramps in her legs.  This improves with ambulation and is worse with rest.  She denies numbness or tingling.  She has never smoked.   Review of Systems      Past Medical History:  Diagnosis Date  . CHF (congestive heart failure) (HCC)   . GERD (gastroesophageal reflux disease)   . HTN (hypertension)   . Hyperparathyroidism   . Migraine, unspecified, with intractable migraine, so stated, without mention of status migrainosus   . Right bundle branch block and left anterior fascicular block   . SVT (supraventricular tachycardia) (HCC)     Current Outpatient Medications  Medication Sig Dispense Refill  . acetaminophen (TYLENOL) 500 MG tablet Take 500 mg by mouth as needed for moderate pain or headache.     . fluticasone (FLONASE) 50 MCG/ACT nasal spray SPRAY 2 SPRAYS INTO EACH NOSTRIL EVERY DAY (Patient taking differently: Place 2 sprays into both nostrils as needed for allergies. ) 16 mL 1  . furosemide (LASIX) 40 MG tablet Take 1 tablet (40 mg total) by  mouth daily. 90 tablet 2  . lisinopril (ZESTRIL) 5 MG tablet Take 1 tablet (5 mg total) by mouth daily. 90 tablet 2  . metoprolol succinate (TOPROL-XL) 25 MG 24 hr tablet Take 0.5 tablets (12.5 mg total) by mouth as needed (heart failure). 30 tablet 1   No current facility-administered medications for this visit.    Allergies  Allergen Reactions  . Lactose Intolerance (Gi) Other (See Comments)    Pain,bloating    Family History  Problem Relation Age of Onset  . Other Mother        barin tumor  . Stroke Father   . Colon cancer Neg Hx   . Rectal cancer Neg Hx   . Stomach cancer Neg Hx     Social History   Socioeconomic History  . Marital status: Married    Spouse name: Not on file  . Number of children: 6  . Years of education: Not on file  . Highest education level: Not on file  Occupational History  . Not on file  Tobacco Use  . Smoking status: Never Smoker  . Smokeless tobacco: Never Used  Vaping Use  . Vaping Use: Never used  Substance and Sexual Activity  . Alcohol use: No    Alcohol/week: 0.0 standard drinks  . Drug use: No  . Sexual activity: Never  Other Topics Concern  . Not on file  Social History  Narrative  . Not on file   Social Determinants of Health   Financial Resource Strain:   . Difficulty of Paying Living Expenses: Not on file  Food Insecurity:   . Worried About Programme researcher, broadcasting/film/video in the Last Year: Not on file  . Ran Out of Food in the Last Year: Not on file  Transportation Needs:   . Lack of Transportation (Medical): Not on file  . Lack of Transportation (Non-Medical): Not on file  Physical Activity:   . Days of Exercise per Week: Not on file  . Minutes of Exercise per Session: Not on file  Stress:   . Feeling of Stress : Not on file  Social Connections:   . Frequency of Communication with Friends and Family: Not on file  . Frequency of Social Gatherings with Friends and Family: Not on file  . Attends Religious Services: Not on file    . Active Member of Clubs or Organizations: Not on file  . Attends Banker Meetings: Not on file  . Marital Status: Not on file  Intimate Partner Violence:   . Fear of Current or Ex-Partner: Not on file  . Emotionally Abused: Not on file  . Physically Abused: Not on file  . Sexually Abused: Not on file     Constitutional: Denies fever, malaise, fatigue, headache or abrupt weight changes.  Respiratory: Denies difficulty breathing, shortness of breath, cough or sputum production.   Cardiovascular: Pt reports chest tightness. Denies chest pain, palpitations or swelling in the hands or feet.  Gastrointestinal: Denies abdominal pain, bloating, constipation, diarrhea or blood in the stool.  Musculoskeletal: Patient reports muscle cramps in legs.  Denies decrease in range of motion, difficulty with gait, or joint pain and swelling.  Skin: Denies redness, rashes, lesions or ulcercations.  Neurological: Denies numbness, tingling, weakness or problems with balance and coordination.  Psych: Denies anxiety, depression, SI/HI.  No other specific complaints in a complete review of systems (except as listed in HPI above).  Objective:   Physical Exam   BP 110/70   Pulse 66   Temp (!) 97.1 F (36.2 C) (Temporal)   Wt 114 lb (51.7 kg)   SpO2 97%   BMI 20.19 kg/m   Wt Readings from Last 3 Encounters:  06/24/20 115 lb (52.2 kg)  06/12/20 113 lb (51.3 kg)  03/20/20 113 lb (51.3 kg)    General: Appears her stated age, well developed, well nourished in NAD. Cardiovascular: Normal rate and rhythm. S1,S2 noted.  No murmur, rubs or gallops noted. No JVD or BLE edema. No carotid bruits noted. Pedal pulses 2+ bilaterally. Pulmonary/Chest: Normal effort and positive vesicular breath sounds. No respiratory distress. No wheezes, rales or ronchi noted.  Abdomen: Soft and nontender. Normal bowel sounds. No distention or masses noted.  Musculoskeletal:. No difficulty with gait.   Neurological: Alert and oriented.    BMET    Component Value Date/Time   NA 140 03/15/2020 0017   K 3.8 03/15/2020 0017   CL 102 03/15/2020 0017   CO2 28 03/15/2020 0017   GLUCOSE 105 (H) 03/15/2020 0017   BUN 17 03/15/2020 0017   CREATININE 0.98 03/15/2020 0017   CALCIUM 9.9 03/15/2020 0017   CALCIUM 12.0 (H) 11/24/2007 1700   GFRNONAA 59 (L) 03/15/2020 0017   GFRAA >60 03/15/2020 0017    Lipid Panel     Component Value Date/Time   CHOL 179 03/08/2017 1120   TRIG 120.0 03/08/2017 1120   HDL 50.10 03/08/2017  1120   CHOLHDL 4 03/08/2017 1120   VLDL 24.0 03/08/2017 1120   LDLCALC 105 (H) 03/08/2017 1120    CBC    Component Value Date/Time   WBC 8.3 03/15/2020 0017   RBC 4.33 03/15/2020 0017   HGB 13.0 03/15/2020 0017   HCT 40.6 03/15/2020 0017   PLT 249 03/15/2020 0017   MCV 93.8 03/15/2020 0017   MCH 30.0 03/15/2020 0017   MCHC 32.0 03/15/2020 0017   RDW 13.1 03/15/2020 0017   LYMPHSABS 0.8 06/17/2017 2306   MONOABS 0.3 06/17/2017 2306   EOSABS 0.0 06/17/2017 2306   BASOSABS 0.0 06/17/2017 2306    Hgb A1C Lab Results  Component Value Date   HGBA1C  11/23/2007    5.0 (NOTE)   The ADA recommends the following therapeutic goals for glycemic   control related to Hgb A1C measurement:   Goal of Therapy:   < 7.0% Hgb A1C   Action Suggested:  > 8.0% Hgb A1C   Ref:  Diabetes Care, 22, Suppl. 1, 1999           Assessment & Plan:   Chest Tightness:  Recent labs reviewed No indication to repeat ECG, chest x-ray at this time If metoprolol effective, would recommend that she take metoprolol daily but she does not want to do this Referral to cardiology for further evaluation  Muscle Cramps in Legs:  Does not sound like PAD but will obtain ABIs Potassium normal Could try magnesium 400 mg p.o. nightly  We will follow-up after procedures and consults, return precautions discussed Nicki Reaper, NP This visit occurred during the SARS-CoV-2 public health  emergency.  Safety protocols were in place, including screening questions prior to the visit, additional usage of staff PPE, and extensive cleaning of exam room while observing appropriate contact time as indicated for disinfecting solutions.

## 2020-08-03 ENCOUNTER — Encounter: Payer: Self-pay | Admitting: Internal Medicine

## 2020-08-03 NOTE — Patient Instructions (Signed)
Angina  Angina is very bad discomfort or pain in the chest, neck, arm, jaw, or back. The discomfort is caused by a lack of blood in the middle layer of the heart wall (myocardium). What are the causes? This condition is caused by a buildup of fat and cholesterol (plaque) in your arteries (atherosclerosis). This buildup narrows the arteries and makes it hard for blood to flow. What increases the risk? You are more likely to develop this condition if:  You have high levels of cholesterol in your blood.  You have high blood pressure (hypertension).  You have diabetes.  You have a family history of heart disease.  You are not active, or you do not exercise enough.  You feel sad (depressed).  You have been treated with high energy rays (radiation) on the left side of your chest. Other risk factors are:  Using tobacco.  Being very overweight (obese).  Eating a diet high in unhealthy fats (saturated fats).  Having stress, or being exposed to things that cause stress.  Using drugs, such as cocaine. Women have a greater risk for angina if:  They are older than 55.  They have stopped having their period (are in postmenopause). What are the signs or symptoms? Common symptoms of this condition in both men and women may include:  Chest pain, which may: ? Feel like a crushing or squeezing in the chest. ? Feel like a tightness, pressure, fullness, or heaviness in the chest. ? Last for more than a few minutes at a time. ? Stop and come back (recur) after a few minutes.  Pain in the neck, arm, jaw, or back.  Heartburn or upset stomach (indigestion) for no reason.  Being short of breath.  Feeling sick to your stomach (nauseous).  Sudden cold sweats. Women and people with diabetes may have other symptoms that are not usual, such as feeling:  Tired (fatigue).  Worried or nervous (anxious) for no reason.  Weak for no reason.  Dizzy or passing out (fainting). How is this  treated? This condition may be treated with:  Medicines. These are given to: ? Prevent blood clots. ? Prevent heart attack. ? Relax blood vessels and improve blood flow to the heart (nitrates). ? Reduce blood pressure. ? Improve the pumping action of the heart. ? Reduce fat and cholesterol in the blood.  A procedure to widen a narrowed or blocked artery in the heart (angioplasty).  Surgery to allow blood to go around a blocked artery (coronary artery bypass surgery). Follow these instructions at home: Medicines  Take over-the-counter and prescription medicines only as told by your doctor.  Do not take these medicines unless your doctor says that you can: ? NSAIDs. These include:  Ibuprofen.  Naproxen. ? Vitamin supplements that have vitamin A, vitamin E, or both. ? Hormone therapy that contains estrogen with or without progestin. Eating and drinking   Eat a heart-healthy diet that includes: ? Lots of fresh fruits and vegetables. ? Whole grains. ? Low-fat (lean) protein. ? Low-fat dairy products.  Follow instructions from your doctor about what you cannot eat or drink. Activity  Follow an exercise program that your doctor tells you.  Talk with your doctor about joining a program to help improve the health of your heart (cardiac rehab).  When you feel tired, take a break. Plan breaks if you know you are going to feel tired. Lifestyle   Do not use any products that contain nicotine or tobacco. This includes cigarettes, e-cigarettes, and   chewing tobacco. If you need help quitting, ask your doctor.  If your doctor says you can drink alcohol: ? Limit how much you use to:  0-1 drink a day for women who are not pregnant.  0-2 drinks a day for men. ? Be aware of how much alcohol is in your drink. In the [place] one drink equals:  One 12 oz bottle of beer (355 mL).  One 5 oz glass of wine (148 mL).  One 1 oz glass of hard liquor (44 mL). General instructions  Stay  at a healthy weight. If your doctor tells you to do so, work with him or her to lose weight.  Learn to deal with stress. If you need help, ask your doctor.  Keep your vaccines up to date. Get a flu shot every year.  Talk with your doctor if you feel sad. Take a screening test to see if you are at risk for depression.  Work with your doctor to manage any other health problems that you have. These may include diabetes or high blood pressure.  Keep all follow-up visits as told by your doctor. This is important. Get help right away if:  You have pain in your chest, neck, arm, jaw, or back, and the pain: ? Lasts more than a few minutes. ? Comes back. ? Does not get better after you take medicine under your tongue (sublingual nitroglycerin). ? Keeps getting worse. ? Comes more often.  You have any of these problems for no reason: ? Sweating a lot. ? Heartburn or upset stomach. ? Shortness of breath. ? Trouble breathing. ? Feeling sick to your stomach. ? Throwing up (vomiting). ? Feeling more tired than normal. ? Feeling nervous or worrying more than normal. ? Weakness.  You are suddenly dizzy or light-headed.  You pass out. These symptoms may be an emergency. Do not wait to see if the symptoms will go away. Get medical help right away. Call your local emergency services (911 in the [place] Do not drive yourself to the hospital. Summary  Angina is very bad discomfort or pain in the chest, neck, arm, neck, or back.  Symptoms include chest pain, heartburn or upset stomach for no reason, and shortness of breath.  Women or people with diabetes may have symptoms that are not usual, such as feeling nervous or worried for no reason, weak for no reason, or tired.  Take all medicines only as told by your doctor.  You should eat a heart-healthy diet and follow an exercise program. This information is not intended to replace advice given to you by your health care provider. Make sure you  discuss any questions you have with your health care provider. Document Revised: 04/24/2018 Document Reviewed: 04/24/2018 Elsevier Patient Education  2020 [employer]vier Inc.  

## 2020-08-25 ENCOUNTER — Ambulatory Visit (INDEPENDENT_AMBULATORY_CARE_PROVIDER_SITE_OTHER): Payer: Medicare Other

## 2020-08-25 ENCOUNTER — Other Ambulatory Visit: Payer: Self-pay

## 2020-08-25 DIAGNOSIS — M79662 Pain in left lower leg: Secondary | ICD-10-CM | POA: Diagnosis not present

## 2020-08-25 DIAGNOSIS — R252 Cramp and spasm: Secondary | ICD-10-CM | POA: Diagnosis not present

## 2020-08-25 DIAGNOSIS — M79661 Pain in right lower leg: Secondary | ICD-10-CM

## 2020-09-17 ENCOUNTER — Other Ambulatory Visit: Payer: Self-pay | Admitting: Internal Medicine

## 2020-10-23 ENCOUNTER — Other Ambulatory Visit: Payer: Self-pay | Admitting: Student

## 2020-10-23 DIAGNOSIS — U071 COVID-19: Secondary | ICD-10-CM | POA: Diagnosis not present

## 2020-10-23 DIAGNOSIS — J329 Chronic sinusitis, unspecified: Secondary | ICD-10-CM | POA: Diagnosis not present

## 2020-10-23 DIAGNOSIS — R109 Unspecified abdominal pain: Secondary | ICD-10-CM | POA: Diagnosis not present

## 2020-10-23 DIAGNOSIS — R1013 Epigastric pain: Secondary | ICD-10-CM

## 2020-10-23 DIAGNOSIS — M545 Low back pain, unspecified: Secondary | ICD-10-CM | POA: Diagnosis not present

## 2020-10-23 DIAGNOSIS — N200 Calculus of kidney: Secondary | ICD-10-CM | POA: Diagnosis not present

## 2020-10-23 DIAGNOSIS — R059 Cough, unspecified: Secondary | ICD-10-CM | POA: Diagnosis not present

## 2020-10-23 DIAGNOSIS — B9689 Other specified bacterial agents as the cause of diseases classified elsewhere: Secondary | ICD-10-CM | POA: Diagnosis not present

## 2020-10-23 DIAGNOSIS — R748 Abnormal levels of other serum enzymes: Secondary | ICD-10-CM | POA: Diagnosis not present

## 2020-10-27 ENCOUNTER — Ambulatory Visit: Payer: Medicare Other

## 2020-11-07 ENCOUNTER — Other Ambulatory Visit: Payer: Self-pay

## 2020-11-07 ENCOUNTER — Ambulatory Visit
Admission: RE | Admit: 2020-11-07 | Discharge: 2020-11-07 | Disposition: A | Discharge status: Home / Self Care | Payer: Medicare HMO | Source: Ambulatory Visit | Attending: Student | Admitting: Student

## 2020-11-07 DIAGNOSIS — R1013 Epigastric pain: Secondary | ICD-10-CM | POA: Diagnosis not present

## 2020-11-07 DIAGNOSIS — N200 Calculus of kidney: Secondary | ICD-10-CM | POA: Diagnosis not present

## 2020-11-07 DIAGNOSIS — K8020 Calculus of gallbladder without cholecystitis without obstruction: Secondary | ICD-10-CM | POA: Diagnosis not present

## 2020-11-14 ENCOUNTER — Ambulatory Visit: Payer: Self-pay | Admitting: Urology

## 2020-11-20 ENCOUNTER — Ambulatory Visit: Payer: Self-pay | Admitting: Urology

## 2020-11-24 ENCOUNTER — Telehealth: Payer: Self-pay

## 2020-11-24 NOTE — Telephone Encounter (Signed)
She is not due for her physical until after 03/20/21

## 2020-11-24 NOTE — Telephone Encounter (Signed)
[facility] Night - Client Nonclinical Telephone Record  [facility] Client [facility] Night - Client Client Site  [facility] - Night Physician Nicki Reaper - NP Contact Type Call Who Is Calling Patient / Member / Family / Caregiver Caller Name [name] Caller Phone Number [phone] Patient Name Madison Miller Patient DOB [birth year] Call Type Message Only Information Provided Reason for Call Request to Schedule Office Appointment Initial Comment Caller states she would like to come to office for a physical. Additional Comment Caller states she would like to make an appointment for a physical. Could the office please contact the caller? Disp. Time Disposition Final User 11/22/2020 10:02:13 AM General Information Provided Yes King-Hussey, Berdi Call Closed By: Vivianne Master Transaction Date/Time: 11/22/2020 9:58:34 AM (ET)

## 2020-11-25 ENCOUNTER — Other Ambulatory Visit: Payer: Self-pay | Admitting: Internal Medicine

## 2020-12-03 ENCOUNTER — Other Ambulatory Visit: Payer: Self-pay

## 2020-12-03 ENCOUNTER — Encounter: Payer: Medicare HMO | Admitting: Internal Medicine

## 2020-12-04 ENCOUNTER — Ambulatory Visit: Payer: Medicare HMO | Admitting: Internal Medicine

## 2020-12-04 ENCOUNTER — Ambulatory Visit (INDEPENDENT_AMBULATORY_CARE_PROVIDER_SITE_OTHER): Payer: Medicare HMO | Admitting: Internal Medicine

## 2020-12-04 ENCOUNTER — Other Ambulatory Visit: Payer: Self-pay | Admitting: Internal Medicine

## 2020-12-04 VITALS — BP 116/68 | HR 62 | Temp 97.2°F | Wt 112.0 lb

## 2020-12-04 DIAGNOSIS — R0789 Other chest pain: Secondary | ICD-10-CM | POA: Diagnosis not present

## 2020-12-04 MED ORDER — NAPROXEN 375 MG PO TABS: 375.0000 mg | ORAL_TABLET | Freq: Two times a day (BID) | ORAL | 0 refills | Status: DC

## 2020-12-04 NOTE — Telephone Encounter (Signed)
Pt was seen earlier today and realized she now needs a refill of metoprolol sent to [facility]. [phone] if you have questions.

## 2020-12-04 NOTE — Progress Notes (Signed)
Subjective:    Patient ID: Madison Miller, female    DOB: 1951/09/05, 70 y.o.   MRN: [id number]  HPI  Patient presents the clinic today with complaint of chest pain.  She reports this started 2 days ago.  She reports the chest pain is constant.  She describes the pain as sore and achy.  The pain is not worse with movement.  It does seem worse with taking a deep breath.  She denies cough or shortness of breath.  She denies reflux or heartburn.  She denies any injury to the chest wall and does not recall lifting anything extremely heavy.  She reports she did lose her husband 2 weeks ago.  She reports she is sad but has not felt overtly depressed or anxious.  She has not taken anything OTC for her symptoms.   Review of Systems  Past Medical History:  Diagnosis Date   CHF (congestive heart failure) (HCC)    GERD (gastroesophageal reflux disease)    HTN (hypertension)    Hyperparathyroidism    Migraine, unspecified, with intractable migraine, so stated, without mention of status migrainosus    Right bundle branch block and left anterior fascicular block    SVT (supraventricular tachycardia) (HCC)     Current Outpatient Medications  Medication Sig Dispense Refill   acetaminophen (TYLENOL) 500 MG tablet Take 500 mg by mouth as needed for moderate pain or headache.      fluticasone (FLONASE) 50 MCG/ACT nasal spray SPRAY 2 SPRAYS INTO EACH NOSTRIL EVERY DAY (Patient taking differently: Place 2 sprays into both nostrils as needed for allergies.) 16 mL 1   furosemide (LASIX) 40 MG tablet TAKE 1 TABLET BY MOUTH EVERY DAY 90 tablet 0   lisinopril (ZESTRIL) 5 MG tablet Take 1 tablet (5 mg total) by mouth daily. 90 tablet 2   naproxen (NAPROSYN) 375 MG tablet Take 1 tablet (375 mg total) by mouth 2 (two) times daily with a meal. 6 tablet 0   metoprolol succinate (TOPROL-XL) 25 MG 24 hr tablet TAKE 1/2 TABLET BY MOUTH AS NEEDED (HEART FAILURE) 45 tablet 0   No current  facility-administered medications for this visit.    Allergies  Allergen Reactions   Lactose Intolerance (Gi) Other (See Comments)    Pain,bloating    Family History  Problem Relation Age of Onset   Other Mother        barin tumor   Stroke Father    Colon cancer Neg Hx    Rectal cancer Neg Hx    Stomach cancer Neg Hx     Social History   Socioeconomic History   Marital status: Married    Spouse name: Not on file   Number of children: 6   Years of education: Not on file   Highest education level: Not on file  Occupational History   Not on file  Tobacco Use   Smoking status: Never Smoker   Smokeless tobacco: Never Used  Vaping Use   Vaping Use: Never used  Substance and Sexual Activity   Alcohol use: No    Alcohol/week: 0.0 standard drinks   Drug use: No   Sexual activity: Never  Other Topics Concern   Not on file  Social History Narrative   Not on file   Social Determinants of Health   Financial Resource Strain: Not on file  Food Insecurity: Not on file  Transportation Needs: Not on file  Physical Activity: Not on file  Stress: Not on file  Social Connections: Not on file  Intimate Partner Violence: Not on file     Constitutional: Denies fever, malaise, fatigue, headache or abrupt weight changes.  Respiratory: Denies difficulty breathing, shortness of breath, cough or sputum production.   Cardiovascular: Denies chest pain, chest tightness, palpitations or swelling in the hands or feet.  Gastrointestinal: Denies abdominal pain, bloating, constipation, diarrhea or blood in the stool.  Musculoskeletal: Patient reports left upper chest pain.  Denies decrease in range of motion, difficulty with gait, or joint pain and swelling.  Skin: Denies redness, rashes, lesions or ulcercations.  Neurological: Denies dizziness, difficulty with memory, difficulty with speech or problems with balance and coordination.  Psych: Denies anxiety, depression,  SI/HI.  No other specific complaints in a complete review of systems (except as listed in HPI above).     Objective:   Physical Exam  BP 116/68    Pulse 62    Temp (!) 97.2 F (36.2 C) (Temporal)    Wt 50.8 kg    SpO2 97%    BMI 19.84 kg/m  Wt Readings from Last 3 Encounters:  12/04/20 50.8 kg  07/31/20 51.7 kg  06/24/20 52.2 kg    General: Appears her stated age, well developed, well nourished in NAD. Skin: Warm, dry and intact. No rashes noted. Cardiovascular: Normal rate and rhythm. S1,S2 noted.  No murmur, rubs or gallops noted. No JVD or BLE edema. Pulmonary/Chest: Normal effort and positive vesicular breath sounds. No respiratory distress. No wheezes, rales or ronchi noted.  Abdomen: Soft and nontender. Normal bowel sounds.  Musculoskeletal: Left upper chest wall tender to palpation between the second and third intercostal space.  No bony abnormality of the left anterior ribs. Neurological: Alert and oriented.    BMET    Component Value Date/Time   NA 140 03/15/2020 0017   K 3.8 03/15/2020 0017   CL 102 03/15/2020 0017   CO2 28 03/15/2020 0017   GLUCOSE 105 (H) 03/15/2020 0017   BUN 17 03/15/2020 0017   CREATININE 0.98 03/15/2020 0017   CALCIUM 9.9 03/15/2020 0017   CALCIUM 12.0 (H) 11/24/2007 1700   GFRNONAA 59 (L) 03/15/2020 0017   GFRAA >60 03/15/2020 0017    Lipid Panel     Component Value Date/Time   CHOL 179 03/08/2017 1120   TRIG 120.0 03/08/2017 1120   HDL 50.10 03/08/2017 1120   CHOLHDL 4 03/08/2017 1120   VLDL 24.0 03/08/2017 1120   LDLCALC 105 (H) 03/08/2017 1120    CBC    Component Value Date/Time   WBC 8.3 03/15/2020 0017   RBC 4.33 03/15/2020 0017   HGB 13.0 03/15/2020 0017   HCT 40.6 03/15/2020 0017   PLT 249 03/15/2020 0017   MCV 93.8 03/15/2020 0017   MCH 30.0 03/15/2020 0017   MCHC 32.0 03/15/2020 0017   RDW 13.1 03/15/2020 0017   LYMPHSABS 0.8 06/17/2017 2306   MONOABS 0.3 06/17/2017 2306   EOSABS 0.0 06/17/2017 2306    BASOSABS 0.0 06/17/2017 2306    Hgb A1C Lab Results  Component Value Date   HGBA1C  11/23/2007    5.0 (NOTE)   The ADA recommends the following therapeutic goals for glycemic   control related to Hgb A1C measurement:   Goal of Therapy:   < 7.0% Hgb A1C   Action Suggested:  > 8.0% Hgb A1C   Ref:  Diabetes Care, 22, Suppl. 1, 1999            Assessment & Plan:   Left  anterior Chest Wall Pain:  Muscular in origin Rx for Naproxen 375 mg p.o. twice daily, consume with food Encouraged ice for 10 minutes twice daily No indication that this is cardiac in origin at this time, however ER precautions discussed  Return precautions discussed Nicki Reaper, NP  This visit occurred during the SARS-CoV-2 public health emergency.  Safety protocols were in place, including screening questions prior to the visit, additional usage of staff PPE, and extensive cleaning of exam room while observing appropriate contact time as indicated for disinfecting solutions.

## 2020-12-04 NOTE — Patient Instructions (Signed)

## 2020-12-05 ENCOUNTER — Encounter: Payer: Self-pay | Admitting: Internal Medicine

## 2020-12-07 NOTE — Progress Notes (Signed)
Pt too early for medicare wellness. Wanted to be seen for chest pain. Nicki Reaper, NP

## 2020-12-18 DIAGNOSIS — R030 Elevated blood-pressure reading, without diagnosis of hypertension: Secondary | ICD-10-CM | POA: Diagnosis not present

## 2020-12-18 DIAGNOSIS — R519 Headache, unspecified: Secondary | ICD-10-CM | POA: Diagnosis not present

## 2020-12-18 DIAGNOSIS — R1084 Generalized abdominal pain: Secondary | ICD-10-CM | POA: Diagnosis not present

## 2020-12-29 DIAGNOSIS — Z03818 Encounter for observation for suspected exposure to other biological agents ruled out: Secondary | ICD-10-CM | POA: Diagnosis not present

## 2020-12-29 DIAGNOSIS — J069 Acute upper respiratory infection, unspecified: Secondary | ICD-10-CM | POA: Diagnosis not present

## 2021-01-09 ENCOUNTER — Other Ambulatory Visit: Payer: Self-pay

## 2021-01-09 ENCOUNTER — Ambulatory Visit (INDEPENDENT_AMBULATORY_CARE_PROVIDER_SITE_OTHER): Payer: Medicare HMO | Admitting: Internal Medicine

## 2021-01-09 ENCOUNTER — Encounter: Payer: Self-pay | Admitting: Internal Medicine

## 2021-01-09 DIAGNOSIS — R1030 Lower abdominal pain, unspecified: Secondary | ICD-10-CM | POA: Diagnosis not present

## 2021-01-09 NOTE — Progress Notes (Signed)
Subjective:    Patient ID: Madison Miller, female    DOB: 05-01-1951, 70 y.o.   MRN: [id number]  HPI Here due to ongoing abdominal pain This visit occurred during the SARS-CoV-2 public health emergency.  Safety protocols were in place, including screening questions prior to the visit, additional usage of staff PPE, and extensive cleaning of exam room while observing appropriate contact time as indicated for disinfecting solutions.   Having pain for about 3 days now Feels very sore--but sort of crampy Constant for the past 3 days More in LLQ but also on right  Not worse with eating  No N/V No heartburn or dysphagia Bowels move daily and she feels that she empties Did try the antibiotic they gave her at [facility] some weeks ago--may have helped briefly  Pain 2 months ago was different Ultrasound after that--- gallstones without cholecystitis  Current Outpatient Medications on File Prior to Visit  Medication Sig Dispense Refill  . acetaminophen (TYLENOL) 500 MG tablet Take 500 mg by mouth as needed for moderate pain or headache.     . fluticasone (FLONASE) 50 MCG/ACT nasal spray SPRAY 2 SPRAYS INTO EACH NOSTRIL EVERY DAY (Patient taking differently: Place 2 sprays into both nostrils as needed for allergies.) 16 mL 1  . furosemide (LASIX) 40 MG tablet TAKE 1 TABLET BY MOUTH EVERY DAY 90 tablet 0  . lisinopril (ZESTRIL) 5 MG tablet Take 1 tablet (5 mg total) by mouth daily. 90 tablet 2  . metoprolol succinate (TOPROL-XL) 25 MG 24 hr tablet TAKE 1/2 TABLET BY MOUTH AS NEEDED (HEART FAILURE) 45 tablet 0  . naproxen (NAPROSYN) 375 MG tablet Take 1 tablet (375 mg total) by mouth 2 (two) times daily with a meal. 6 tablet 0   No current facility-administered medications on file prior to visit.    Allergies  Allergen Reactions  . Lactose Intolerance (Gi) Other (See Comments)    Pain,bloating    Past Medical History:  Diagnosis Date  . CHF (congestive heart failure) (HCC)   .  GERD (gastroesophageal reflux disease)   . HTN (hypertension)   . Hyperparathyroidism   . Migraine, unspecified, with intractable migraine, so stated, without mention of status migrainosus   . Right bundle branch block and left anterior fascicular block   . SVT (supraventricular tachycardia) (HCC)     Past Surgical History:  Procedure Laterality Date  . LUMBAR LAMINECTOMY  2010   Dr. Phoebe Perch  . PARATHYROIDECTOMY  2009    Family History  Problem Relation Age of Onset  . Other Mother        barin tumor  . Stroke Father   . Colon cancer Neg Hx   . Rectal cancer Neg Hx   . Stomach cancer Neg Hx     Social History   Socioeconomic History  . Marital status: Married    Spouse name: Not on file  . Number of children: 6  . Years of education: Not on file  . Highest education level: Not on file  Occupational History  . Not on file  Tobacco Use  . Smoking status: Never Smoker  . Smokeless tobacco: Never Used  Vaping Use  . Vaping Use: Never used  Substance and Sexual Activity  . Alcohol use: No    Alcohol/week: 0.0 standard drinks  . Drug use: No  . Sexual activity: Never  Other Topics Concern  . Not on file  Social History Narrative  . Not on file   Social Determinants of  Health   Financial Resource Strain: Not on file  Food Insecurity: Not on file  Transportation Needs: Not on file  Physical Activity: Not on file  Stress: Not on file  Social Connections: Not on file  Intimate Partner Violence: Not on file   Review of Systems Appetite is okay May have lost some weight Also had some cough and congestion    Objective:   Physical Exam Constitutional:      Appearance: She is well-developed.  Cardiovascular:     Rate and Rhythm: Normal rate and regular rhythm.     Heart sounds: No murmur heard. No gallop.   Pulmonary:     Effort: Pulmonary effort is normal.     Breath sounds: Normal breath sounds. No wheezing or rales.  Abdominal:     General: Abdomen is  flat.     Palpations: Abdomen is soft. There is no mass.     Hernia: No hernia is present.     Comments: Diffuse moderate lower abdominal tenderness---bilateral  Musculoskeletal:     Cervical back: Neck supple.  Lymphadenopathy:     Cervical: No cervical adenopathy.  Neurological:     Mental Status: She is alert.            Assessment & Plan:

## 2021-01-09 NOTE — Assessment & Plan Note (Addendum)
Has had some varying abdominal pain for 2 months Ultrasound showed kidney and gallstones but no inflammation or clear cause of pain Now more constant and she has tenderness Has lost 4# in just a short period of time Last colonoscopy 10 years ago--was benign Recent labs okay--but no LFTs (though liver okay on ultrasound)  Will check CT scan GI referral as well Discussed heading to ER if worsens

## 2021-01-12 ENCOUNTER — Encounter: Payer: Self-pay | Admitting: *Deleted

## 2021-01-30 ENCOUNTER — Ambulatory Visit: Payer: Medicare HMO

## 2021-02-05 ENCOUNTER — Other Ambulatory Visit: Payer: Self-pay

## 2021-02-05 ENCOUNTER — Ambulatory Visit
Admission: RE | Admit: 2021-02-05 | Discharge: 2021-02-05 | Disposition: A | Discharge status: Home / Self Care | Payer: Medicare HMO | Source: Ambulatory Visit | Attending: Internal Medicine | Admitting: Internal Medicine

## 2021-02-05 DIAGNOSIS — N200 Calculus of kidney: Secondary | ICD-10-CM | POA: Diagnosis not present

## 2021-02-05 DIAGNOSIS — R634 Abnormal weight loss: Secondary | ICD-10-CM | POA: Diagnosis not present

## 2021-02-05 DIAGNOSIS — R1030 Lower abdominal pain, unspecified: Secondary | ICD-10-CM | POA: Diagnosis not present

## 2021-02-05 DIAGNOSIS — K8020 Calculus of gallbladder without cholecystitis without obstruction: Secondary | ICD-10-CM | POA: Diagnosis not present

## 2021-02-05 DIAGNOSIS — K8080 Other cholelithiasis without obstruction: Secondary | ICD-10-CM | POA: Diagnosis not present

## 2021-02-26 ENCOUNTER — Telehealth: Payer: Self-pay | Admitting: Internal Medicine

## 2021-02-26 MED ORDER — METOPROLOL SUCCINATE ER 25 MG PO TB24: ORAL_TABLET | ORAL | 1 refills | Status: DC

## 2021-02-26 MED ORDER — LISINOPRIL 5 MG PO TABS: 5.0000 mg | ORAL_TABLET | Freq: Every day | ORAL | 1 refills | Status: DC

## 2021-02-26 MED ORDER — FUROSEMIDE 40 MG PO TABS: 40.0000 mg | ORAL_TABLET | Freq: Every day | ORAL | 1 refills | Status: DC

## 2021-02-26 NOTE — Addendum Note (Signed)
Addended by: Worthy Rancher B on: 02/26/2021 01:05 PM   Modules accepted: Orders

## 2021-02-26 NOTE — Telephone Encounter (Signed)
  LAST APPOINTMENT DATE: 01/09/2021   NEXT APPOINTMENT DATE:@Visit  date not found  MEDICATION: metoprolol, lisinopril, furosemide  PH[facility]ACY: [facility] [address]  Let patient know to contact pharmacy at the end of the day to make sure medication is ready.  Please notify patient to allow 48-72 hours to process  Encourage patient to contact the pharmacy for refills or they can request refills through [facility]  CLINICAL FILLS OUT ALL BELOW:   LAST REFILL:  QTY:  REFILL DATE:    OTHER COMMENTS:    Okay for refill?  Please advise

## 2021-03-27 ENCOUNTER — Ambulatory Visit (INDEPENDENT_AMBULATORY_CARE_PROVIDER_SITE_OTHER): Payer: Medicare HMO | Admitting: Family Medicine

## 2021-03-27 ENCOUNTER — Encounter: Payer: Self-pay | Admitting: Family Medicine

## 2021-03-27 ENCOUNTER — Other Ambulatory Visit: Payer: Self-pay

## 2021-03-27 VITALS — BP 110/74 | HR 62 | Temp 97.2°F | Ht 63.0 in | Wt 104.1 lb

## 2021-03-27 DIAGNOSIS — Z0000 Encounter for general adult medical examination without abnormal findings: Secondary | ICD-10-CM | POA: Diagnosis not present

## 2021-03-27 DIAGNOSIS — I509 Heart failure, unspecified: Secondary | ICD-10-CM

## 2021-03-27 DIAGNOSIS — I10 Essential (primary) hypertension: Secondary | ICD-10-CM | POA: Diagnosis not present

## 2021-03-27 DIAGNOSIS — Z1211 Encounter for screening for malignant neoplasm of colon: Secondary | ICD-10-CM | POA: Diagnosis not present

## 2021-03-27 DIAGNOSIS — I471 Supraventricular tachycardia, unspecified: Secondary | ICD-10-CM

## 2021-03-27 LAB — CBC WITH DIFFERENTIAL/PLATELET
Basophils Absolute: 0 10*3/uL (ref 0.0–0.1)
Basophils Relative: 0.8 % (ref 0.0–3.0)
Eosinophils Absolute: 0 10*3/uL (ref 0.0–0.7)
Eosinophils Relative: 0.7 % (ref 0.0–5.0)
HCT: 38.1 % (ref 36.0–46.0)
Hemoglobin: 12.7 g/dL (ref 12.0–15.0)
Lymphocytes Relative: 26.3 % (ref 12.0–46.0)
Lymphs Abs: 1.5 10*3/uL (ref 0.7–4.0)
MCHC: 33.2 g/dL (ref 30.0–36.0)
MCV: 90.9 fl (ref 78.0–100.0)
Monocytes Absolute: 0.5 10*3/uL (ref 0.1–1.0)
Monocytes Relative: 7.7 % (ref 3.0–12.0)
Neutro Abs: 3.8 10*3/uL (ref 1.4–7.7)
Neutrophils Relative %: 64.5 % (ref 43.0–77.0)
Platelets: 214 10*3/uL (ref 150.0–400.0)
RBC: 4.19 Mil/uL (ref 3.87–5.11)
RDW: 14.3 % (ref 11.5–15.5)
WBC: 5.9 10*3/uL (ref 4.0–10.5)

## 2021-03-27 LAB — LIPID PANEL
Cholesterol: 210 mg/dL — ABNORMAL HIGH (ref 0–200)
HDL: 64.2 mg/dL (ref >39.00)
LDL Cholesterol: 128 mg/dL — ABNORMAL HIGH (ref 0–99)
NonHDL: 146.02
Total CHOL/HDL Ratio: 3
Triglycerides: 90 mg/dL (ref 0.0–149.0)
VLDL: 18 mg/dL (ref 0.0–40.0)

## 2021-03-27 LAB — COMPREHENSIVE METABOLIC PANEL
ALT: 7 U/L (ref 0–35)
AST: 15 U/L (ref 0–37)
Albumin: 4.7 g/dL (ref 3.5–5.2)
Alkaline Phosphatase: 39 U/L (ref 39–117)
BUN: 24 mg/dL — ABNORMAL HIGH (ref 6–23)
CO2: 31 mEq/L (ref 19–32)
Calcium: 10.1 mg/dL (ref 8.4–10.5)
Chloride: 103 mEq/L (ref 96–112)
Creatinine, Ser: 0.85 mg/dL (ref 0.40–1.20)
GFR: 69.85 mL/min (ref >60.00)
Glucose, Bld: 80 mg/dL (ref 70–99)
Potassium: 4 mEq/L (ref 3.5–5.1)
Sodium: 141 mEq/L (ref 135–145)
Total Bilirubin: 0.6 mg/dL (ref 0.2–1.2)
Total Protein: 7.8 g/dL (ref 6.0–8.3)

## 2021-03-27 LAB — TSH: TSH: 1.38 u[IU]/mL (ref 0.35–5.50)

## 2021-03-27 NOTE — Patient Instructions (Addendum)
I will set up the cologuard test for colon cancer screening -you will get a call   Make sure you get at least 2000 units of vitamin D daily for bone health   Please work on the advance directive (blue booklet)    Let us know if you need help with your feet/cutting nails   Let us know if you want to see a hearing specialist in the future   Take care   Labs today

## 2021-03-27 NOTE — Progress Notes (Signed)
Subjective:    Patient ID: Madison Miller, female    DOB: 1950/11/12, 70 y.o.   MRN: [id number]  This visit occurred during the SARS-CoV-2 public health emergency.  Safety protocols were in place, including screening questions prior to the visit, additional usage of staff PPE, and extensive cleaning of exam room while observing appropriate contact time as indicated for disinfecting solutions.   HPI 70 yo pt of NP Baity presents for amw and health mt exam with review of chronic medical problems  I have personally reviewed the Medicare Annual Wellness questionnaire and have noted 1. The patient's medical and social history 2. Their use of alcohol, tobacco or illicit drugs 3. Their current medications and supplements 4. The patient's functional ability including ADL's, fall risks, home safety risks and hearing or visual             impairment. 5. Diet and physical activities 6. Evidence for depression or mood disorders  The patients weight, height, BMI have been recorded in the chart and visual acuity is per eye clinic.  I have made referrals, counseling and provided education to the patient based review of the above and I have provided the pt with a written personalized care plan for preventive services. Reviewed and updated provider list, see scanned forms.  See scanned forms.  Routine anticipatory guidance given to patient.  See health maintenance. Colon cancer screening  colonoscopy 8/11- wants to wait / will do cologuard  Breast cancer screening -declines mammograms  Self breast exam- no lumps  Flu vaccine- no  Tetanus vaccine - had one a while ago  Pneumovax-no Covid shot also declines  Zoster vaccine- declines  Dexa 6/14 bmd normal  Falls-none  Fractures-none  Supplements- takes vit D  Exercise -sometimes/ walks and stationary bike   Advance directive- does not have / given materials to work on this Cognitive function addressed- see scanned forms- and if abnormal  then additional documentation follows.   No particular memory problems right now Reads  Handles own affairs  Not confused   PMH and SH reviewed  Meds, vitals, and allergies reviewed.   ROS: See HPI.  Otherwise negative.    Weight : Wt Readings from Last 3 Encounters:  03/27/21 104 lb 1 oz (47.2 kg)  01/09/21 108 lb (49 kg)  12/04/20 112 lb (50.8 kg)   18.43 kg/m No change in appetite  Getting a little more exercise  Husband passed in feb - she lost wt after that   Coping ok with grief overall  6 kids -some are close by  Keeps people around her    Hearing/vision Hearing Screening   500Hz  1000Hz  2000Hz  4000Hz   Right ear 0 40 40 40  Left ear 0 40 0 0   Vision Screening   Right eye Left eye Both eyes  Without correction     With correction 20/30 20/25 20/25    She does notice hearing is worse in L ear- not quite a year  No pain  Occ wax     Care team [facility]  -cardiology    H/o CHF and HTN and SVT Interestingly last echo in the chart looks nl   BP Readings from Last 3 Encounters:  03/27/21 110/74  01/09/21 110/70  12/04/20 116/68   Pulse Readings from Last 3 Encounters:  03/27/21 62  01/09/21 82  12/04/20 62   Takes lisinopril 5 mg daily and metoprolol xl 12.5 mg daily and lasix 40 mg daily  Occ she  still gets chest pain that can come on any time No cardiologist since 2017 ?   Due for labs   Patient Active Problem List   Diagnosis Date Noted   Paroxysmal SVT (supraventricular tachycardia) (HCC) 03/28/2021   Medicare annual wellness visit, initial 03/27/2021   Routine general medical examination at a health care facility 03/27/2021   Essential hypertension 03/27/2021   Colon cancer screening 03/27/2021   Lower abdominal pain 01/09/2021   CHF (congestive heart failure) (HCC) 10/17/2017   GERD (gastroesophageal reflux disease)    ALLERGIC RHINITIS, SEASONAL 07/03/2008   Past Medical History:  Diagnosis Date   CHF (congestive heart  failure) (HCC)    GERD (gastroesophageal reflux disease)    HTN (hypertension)    Hyperparathyroidism    Migraine, unspecified, with intractable migraine, so stated, without mention of status migrainosus    Right bundle branch block and left anterior fascicular block    SVT (supraventricular tachycardia) (HCC)    Past Surgical History:  Procedure Laterality Date   LUMBAR LAMINECTOMY  2010   Dr. Phoebe PerchHirsch   PARATHYROIDECTOMY  2009   Social History   Tobacco Use   Smoking status: Never   Smokeless tobacco: Never  Vaping Use   Vaping Use: Never used  Substance Use Topics   Alcohol use: No    Alcohol/week: 0.0 standard drinks   Drug use: No   Family History  Problem Relation Age of Onset   Other Mother        barin tumor   Stroke Father    Colon cancer Neg Hx    Rectal cancer Neg Hx    Stomach cancer Neg Hx    Allergies  Allergen Reactions   Lactose Intolerance (Gi) Other (See Comments)    Pain,bloating   Current Outpatient Medications on File Prior to Visit  Medication Sig Dispense Refill   acetaminophen (TYLENOL) 500 MG tablet Take 500 mg by mouth as needed for moderate pain or headache.      fluticasone (FLONASE) 50 MCG/ACT nasal spray SPRAY 2 SPRAYS INTO EACH NOSTRIL EVERY DAY (Patient taking differently: Place 2 sprays into both nostrils as needed for allergies.) 16 mL 1   furosemide (LASIX) 40 MG tablet Take 1 tablet (40 mg total) by mouth daily. 90 tablet 1   lisinopril (ZESTRIL) 5 MG tablet Take 1 tablet (5 mg total) by mouth daily. 90 tablet 1   metoprolol succinate (TOPROL-XL) 25 MG 24 hr tablet TAKE 1/2 TABLET BY MOUTH AS NEEDED (HEART FAILURE) 45 tablet 1   naproxen (NAPROSYN) 375 MG tablet Take 1 tablet (375 mg total) by mouth 2 (two) times daily with a meal. 6 tablet 0   No current facility-administered medications on file prior to visit.     Review of Systems  Constitutional:  Negative for activity change, appetite change, fatigue, fever and unexpected  weight change.  HENT:  Negative for congestion, ear pain, rhinorrhea, sinus pressure and sore throat.   Eyes:  Negative for pain, redness and visual disturbance.  Respiratory:  Negative for cough, shortness of breath and wheezing.   Cardiovascular:  Negative for chest pain and palpitations.  Gastrointestinal:  Negative for abdominal pain, blood in stool, constipation and diarrhea.  Endocrine: Negative for polydipsia and polyuria.  Genitourinary:  Negative for dysuria, frequency and urgency.  Musculoskeletal:  Negative for arthralgias, back pain and myalgias.  Skin:  Negative for pallor and rash.  Allergic/Immunologic: Negative for environmental allergies.  Neurological:  Negative for dizziness, syncope and headaches.  Hematological:  Negative for adenopathy. Does not bruise/bleed easily.  Psychiatric/Behavioral:  Negative for decreased concentration and dysphoric mood. The patient is not nervous/anxious.        Grief/lost husb in Feb      Objective:   Physical Exam Constitutional:      General: She is not in acute distress.    Appearance: Normal appearance. She is well-developed. She is not ill-appearing or diaphoretic.     Comments: Underweight but not frail appearing   HENT:     Head: Normocephalic and atraumatic.     Right Ear: Tympanic membrane, ear canal and external ear normal.     Left Ear: Tympanic membrane, ear canal and external ear normal.     Nose: Nose normal. No congestion.  Eyes:     General: No scleral icterus.    Extraocular Movements: Extraocular movements intact.     Conjunctiva/sclera: Conjunctivae normal.     Pupils: Pupils are equal, round, and reactive to light.  Neck:     Thyroid: No thyromegaly.     Vascular: No carotid bruit or JVD.  Cardiovascular:     Rate and Rhythm: Normal rate and regular rhythm.     Pulses: Normal pulses.     Heart sounds: Normal heart sounds.    No gallop.  Pulmonary:     Effort: Pulmonary effort is normal. No respiratory  distress.     Breath sounds: Normal breath sounds. No wheezing.     Comments: Good air exch Chest:     Chest wall: No tenderness.  Abdominal:     General: Bowel sounds are normal. There is no distension or abdominal bruit.     Palpations: Abdomen is soft. There is no mass.     Tenderness: There is no abdominal tenderness.     Hernia: No hernia is present.  Genitourinary:    Comments: Breast exam: No mass, nodules, thickening, tenderness, bulging, retraction, inflamation, nipple discharge or skin changes noted.  No axillary or clavicular LA.     Musculoskeletal:        General: No tenderness. Normal range of motion.     Cervical back: Normal range of motion and neck supple. No rigidity or tenderness. No muscular tenderness.     Right lower leg: No edema.     Left lower leg: No edema.     Comments: No kyphosis   Lymphadenopathy:     Cervical: No cervical adenopathy.  Skin:    General: Skin is warm and dry.     Coloration: Skin is not pale.     Findings: No erythema or rash.  Neurological:     Mental Status: She is alert. Mental status is at baseline.     Cranial Nerves: No cranial nerve deficit.     Motor: No abnormal muscle tone.     Coordination: Coordination normal.     Gait: Gait normal.     Deep Tendon Reflexes: Reflexes are normal and symmetric. Reflexes normal.  Psychiatric:        Mood and Affect: Mood normal.        Cognition and Memory: Cognition and memory normal.     Comments: Pleasant           Assessment & Plan:   Problem List Items Addressed This Visit       Cardiovascular and Mediastinum   CHF (congestive heart failure) (HCC)    Pt is new to me today  She is unsure of last cardiology visit  Remote  h/o SVT w/o re occurrence Last echo looked normal  No cliical change with current medicines       Essential hypertension    bp in fair control at this time  BP Readings from Last 1 Encounters:  03/27/21 110/74  No changes needed Most recent labs  reviewed  Disc lifstyle change with low sodium diet and exercise  Plan to continue Lisinopril 5 mg daily and metoprolol xl 12.5 mg daily and lasix 40 mg daily         Relevant Orders   CBC with Differential/Platelet (Completed)   Comprehensive metabolic panel (Completed)   Lipid panel (Completed)   TSH (Completed)   Paroxysmal SVT (supraventricular tachycardia) (HCC)    Taking metoprolol xl 12.5 mg daily  Denies any re occ lately  Rev last cardiology visit 2017         Other   Medicare annual wellness visit, initial - Primary    Reviewed health habits including diet and exercise and skin cancer prevention Reviewed appropriate screening tests for age  Also reviewed health mt list, fam hx and immunization status , as well as social and family history   See HPI Labs ordered Declines mammogram  cologuard ordered (declines colonoscopy) Declines all vaccines incl covid and zoster/ questions answered Nl dexa 6/14 and no falls or fx Enc to continue vitamin D Given materials to work on advance directive No cognitive concerns, handles her own affairs Hearing screen abn in L ear -pt declines further eval of this  Vision screen stable  Disc need for more calories/protein to gain weight (she has eaten less sine her husband died in November 25, 2022)  Overall pt thinks she is doing well with grief and declines counseling or other help        Routine general medical examination at a health care facility    Reviewed health habits including diet and exercise and skin cancer prevention Reviewed appropriate screening tests for age  Also reviewed health mt list, fam hx and immunization status , as well as social and family history   See HPI Labs ordered Declines mammogram  cologuard ordered (declines colonoscopy) Declines all vaccines incl covid and zoster/ questions answered Nl dexa 6/14 and no falls or fx Enc to continue vitamin D Given materials to work on advance directive No cognitive  concerns, handles her own affairs Hearing screen abn in L ear -pt declines further eval of this  Vision screen stable  Disc need for more calories/protein to gain weight (she has eaten less sine her husband died in 25-Nov-2022)  Overall pt thinks she is doing well with grief and declines counseling or other help        Colon cancer screening    Pt declines colonoscopy (last one was 2011) but open to cologuard cologuard ordered       Relevant Orders   Cologuard

## 2021-03-28 DIAGNOSIS — I471 Supraventricular tachycardia: Secondary | ICD-10-CM | POA: Insufficient documentation

## 2021-03-28 NOTE — Assessment & Plan Note (Signed)
Pt is new to me today  She is unsure of last cardiology visit  Remote h/o SVT w/o re occurrence Last echo looked normal  No cliical change with current medicines

## 2021-03-28 NOTE — Assessment & Plan Note (Signed)
bp in fair control at this time  BP Readings from Last 1 Encounters:  03/27/21 110/74   No changes needed Most recent labs reviewed  Disc lifstyle change with low sodium diet and exercise  Plan to continue Lisinopril 5 mg daily and metoprolol xl 12.5 mg daily and lasix 40 mg daily

## 2021-03-28 NOTE — Assessment & Plan Note (Signed)
Taking metoprolol xl 12.5 mg daily  Denies any re occ lately  Rev last cardiology visit 2017

## 2021-03-28 NOTE — Assessment & Plan Note (Signed)
Pt declines colonoscopy (last one was 2011) but open to cologuard cologuard ordered

## 2021-03-28 NOTE — Assessment & Plan Note (Signed)
Reviewed health habits including diet and exercise and skin cancer prevention Reviewed appropriate screening tests for age  Also reviewed health mt list, fam hx and immunization status , as well as social and family history   See HPI Labs ordered Declines mammogram  cologuard ordered (declines colonoscopy) Declines all vaccines incl covid and zoster/ questions answered Nl dexa 6/14 and no falls or fx Enc to continue vitamin D Given materials to work on advance directive No cognitive concerns, handles her own affairs Hearing screen abn in L ear -pt declines further eval of this  Vision screen stable  Disc need for more calories/protein to gain weight (she has eaten less sine her husband died in February)  Overall pt thinks she is doing well with grief and declines counseling or other help  

## 2021-03-28 NOTE — Assessment & Plan Note (Signed)
Reviewed health habits including diet and exercise and skin cancer prevention Reviewed appropriate screening tests for age  Also reviewed health mt list, fam hx and immunization status , as well as social and family history   See HPI Labs ordered Declines mammogram  cologuard ordered (declines colonoscopy) Declines all vaccines incl covid and zoster/ questions answered Nl dexa 6/14 and no falls or fx Enc to continue vitamin D Given materials to work on advance directive No cognitive concerns, handles her own affairs Hearing screen abn in L ear -pt declines further eval of this  Vision screen stable  Disc need for more calories/protein to gain weight (she has eaten less sine her husband died in Nov 14, 2022)  Overall pt thinks she is doing well with grief and declines counseling or other help

## 2021-03-30 ENCOUNTER — Encounter: Payer: Self-pay | Admitting: *Deleted

## 2021-04-07 DIAGNOSIS — Z1211 Encounter for screening for malignant neoplasm of colon: Secondary | ICD-10-CM | POA: Diagnosis not present

## 2021-04-11 LAB — COLOGUARD: Cologuard: NEGATIVE

## 2021-04-13 ENCOUNTER — Encounter: Payer: Self-pay | Admitting: *Deleted

## 2021-09-03 ENCOUNTER — Other Ambulatory Visit: Payer: Self-pay | Admitting: Internal Medicine

## 2021-09-03 ENCOUNTER — Other Ambulatory Visit: Payer: Self-pay | Admitting: Family

## 2021-09-03 MED ORDER — FUROSEMIDE 40 MG PO TABS: 40.0000 mg | ORAL_TABLET | Freq: Every day | ORAL | 1 refills | Status: DC

## 2021-09-03 NOTE — Telephone Encounter (Signed)
°  Encourage patient to contact the pharmacy for refills or they can request refills through [facility]  LAST APPOINTMENT DATE:  Please schedule appointment if longer than 1 year  NEXT APPOINTMENT DATE:no future appt  MEDICATION:furosemide (LASIX) 40 MG tablet   Is the patient out of medication? YES  PHARMACY:[id number] - [place], [place] - [address]  Let patient know to contact pharmacy at the end of the day to make sure medication is ready.  Please notify patient to allow 48-72 hours to process  CLINICAL FILLS OUT ALL BELOW:   LAST REFILL:  QTY:  REFILL DATE:    OTHER COMMENTS:    Okay for refill?  Please advise

## 2021-09-03 NOTE — Telephone Encounter (Signed)
Name of Medication: furosemide 40 mg Name of Pharmacy: [facility] [place] Last Fill or Written Date and Quantity: #90 x 1 on 02/26/21 Last Office Visit and Type: 03/27/2021 medicare wellness Next Office Visit and Type: none scheduled  Pt is out of med, sending note to [facility]

## 2021-09-11 ENCOUNTER — Telehealth: Payer: Self-pay

## 2021-09-11 NOTE — Telephone Encounter (Signed)
I spoke with pt and she has mid chest pain that goes up into her neck. Advised pt she needs to go to Ed for evaluation and testing. Pt said she does not have power right now and will go if she gets worse. I offered to call 911 for pt but she said she will get a way to hospital if needed. Explained to pt with her symptoms that she is having now she does not want to get worse before going. Pt said she might go to [facility] but she is not sure at this time. Pt said she will call back later for TOC since R Baity NP has left [facility]. Sending note to Dr Alphonsus Sias who is in office this morning and [facility] CMA. Will teams [place] as well.

## 2021-09-11 NOTE — Telephone Encounter (Signed)
[facility] Day - Client TELEPHONE ADVICE RECORD Access[facility]urse Patient Name: Madison Miller Gender: Female DOB: 01-29-1951 Age: 70 Y 11 M 26 D Return Phone Number: [phone] (Primary) Address: City/ State/ Zip: [place]  [address] Client [facility] Day - Client Client Site [facility] [place] - Day Provider AA - PHYSICIAN, Crissie Figures- MD Contact Type Call Who Is Calling Patient / Member / Family / Caregiver Call Type Triage / Clinical Relationship To Patient Self Return Phone Number [phone] (Primary) Chief Complaint BREATHING - shortness of breath or sounds breathless Reason for Call Symptomatic / Request for Health Information Initial Comment Caller states she has SOB and chest pain. Translation No Nurse Assessment Nurse: Thana Farr, RN, Ladona Ridgel Date/Time Lamount Cohen Time): 09/11/2021 8:18:22 AM Confirm and document reason for call. If symptomatic, describe symptoms. ---Caller states she has SOB and chest pain coming and going. Symptoms started a few days ago. Current pain level 5/10. Does the patient have any new or worsening symptoms? ---Yes Will a triage be completed? ---Yes Related visit to physician within the last 2 weeks? ---N/A Does the PT have any chronic conditions? (i.e. diabetes, asthma, this includes High risk factors for pregnancy, etc.) ---Unknown Is this a behavioral health or substance abuse call? ---No Guidelines Guideline Title Affirmed Question Affirmed Notes Nurse Date/Time (Eastern Time) Chest Pain [1] Chest pain (or "angina") comes and goes AND [2] is happening more often (increasing in frequency) or getting worse (increasing in severity) (Exception: chest pains that last only a few seconds) Thana Farr, RN, Ladona Ridgel 09/11/2021 8:19:41 AM PLEASE NOTE: All timestamps contained within this report are represented as [place] Standard Time. CONFIDENTIALTY NOTICE: This fax  transmission is intended only for the addressee. It contains information that is legally privileged, confidential or otherwise protected from use or disclosure. If you are not the intended recipient, you are strictly prohibited from reviewing, disclosing, copying using or disseminating any of this information or taking any action in reliance on or regarding this information. If you have received this fax in error, please notify us immediately by telephone so that we can arrange for its return to [place]. Phone: [phone], Toll-Free: [phone], Fax: [phone] Page: 2 of 2 Call Id: [id number] Disp. Time Lamount Cohen Time) Disposition Final User 09/11/2021 8:16:45 AM Send to Urgent Queue Maury Dus 09/11/2021 8:23:50 AM Go to ED Now Yes Thana Farr, RN, Ursula Beath Disagree/Comply Comply Caller Understands Yes PreDisposition Call Doctor Care Advice Given Per Guideline * Leave now. Drive carefully. * Go to the ED at ___________ Hospital. * You need to be seen in the Emergency Department. GO TO ED NOW: ANOTHER ADULT SHOULD DRIVE: * It is better and safer if another adult drives instead of you. CALL EMS IF: Referrals [facility]

## 2021-09-15 NOTE — Telephone Encounter (Signed)
I am glad to hear she is feeling better. Can hold off on evaluation

## 2021-09-15 NOTE — Telephone Encounter (Signed)
Patient called back, states she is feeling better.

## 2021-09-15 NOTE — Telephone Encounter (Signed)
Per DPR left v/m requesting pt to cb with update. Sending note to triage and [facility] CMA.

## 2021-10-29 DIAGNOSIS — R319 Hematuria, unspecified: Secondary | ICD-10-CM | POA: Diagnosis not present

## 2021-10-29 DIAGNOSIS — M79622 Pain in left upper arm: Secondary | ICD-10-CM | POA: Diagnosis not present

## 2021-11-10 DIAGNOSIS — M79622 Pain in left upper arm: Secondary | ICD-10-CM | POA: Diagnosis not present

## 2021-11-13 ENCOUNTER — Encounter: Payer: Self-pay | Admitting: Family

## 2021-11-13 ENCOUNTER — Other Ambulatory Visit: Payer: Self-pay

## 2021-11-13 ENCOUNTER — Ambulatory Visit (INDEPENDENT_AMBULATORY_CARE_PROVIDER_SITE_OTHER): Payer: Medicare HMO | Admitting: Family

## 2021-11-13 VITALS — BP 102/68 | HR 64 | Ht 63.0 in | Wt 122.0 lb

## 2021-11-13 DIAGNOSIS — N200 Calculus of kidney: Secondary | ICD-10-CM | POA: Diagnosis not present

## 2021-11-13 DIAGNOSIS — E782 Mixed hyperlipidemia: Secondary | ICD-10-CM | POA: Diagnosis not present

## 2021-11-13 DIAGNOSIS — J301 Allergic rhinitis due to pollen: Secondary | ICD-10-CM

## 2021-11-13 DIAGNOSIS — I471 Supraventricular tachycardia: Secondary | ICD-10-CM | POA: Diagnosis not present

## 2021-11-13 DIAGNOSIS — I509 Heart failure, unspecified: Secondary | ICD-10-CM

## 2021-11-13 DIAGNOSIS — Z87440 Personal history of urinary (tract) infections: Secondary | ICD-10-CM | POA: Diagnosis not present

## 2021-11-13 DIAGNOSIS — I10 Essential (primary) hypertension: Secondary | ICD-10-CM | POA: Diagnosis not present

## 2021-11-13 DIAGNOSIS — H1013 Acute atopic conjunctivitis, bilateral: Secondary | ICD-10-CM

## 2021-11-13 DIAGNOSIS — R82998 Other abnormal findings in urine: Secondary | ICD-10-CM | POA: Diagnosis not present

## 2021-11-13 LAB — COMPREHENSIVE METABOLIC PANEL
ALT: 21 U/L (ref 0–35)
AST: 25 U/L (ref 0–37)
Albumin: 4.6 g/dL (ref 3.5–5.2)
Alkaline Phosphatase: 51 U/L (ref 39–117)
BUN: 21 mg/dL (ref 6–23)
CO2: 37 mEq/L — ABNORMAL HIGH (ref 19–32)
Calcium: 10 mg/dL (ref 8.4–10.5)
Chloride: 100 mEq/L (ref 96–112)
Creatinine, Ser: 1 mg/dL (ref 0.40–1.20)
GFR: 57.22 mL/min — ABNORMAL LOW (ref >60.00)
Glucose, Bld: 89 mg/dL (ref 70–99)
Potassium: 3.7 mEq/L (ref 3.5–5.1)
Sodium: 139 mEq/L (ref 135–145)
Total Bilirubin: 0.7 mg/dL (ref 0.2–1.2)
Total Protein: 8.2 g/dL (ref 6.0–8.3)

## 2021-11-13 LAB — CBC WITH DIFFERENTIAL/PLATELET
Basophils Absolute: 0.1 10*3/uL (ref 0.0–0.1)
Basophils Relative: 0.9 % (ref 0.0–3.0)
Eosinophils Absolute: 0.1 10*3/uL (ref 0.0–0.7)
Eosinophils Relative: 1.5 % (ref 0.0–5.0)
HCT: 37.9 % (ref 36.0–46.0)
Hemoglobin: 12.5 g/dL (ref 12.0–15.0)
Lymphocytes Relative: 26.4 % (ref 12.0–46.0)
Lymphs Abs: 1.6 10*3/uL (ref 0.7–4.0)
MCHC: 33 g/dL (ref 30.0–36.0)
MCV: 90.8 fl (ref 78.0–100.0)
Monocytes Absolute: 0.5 10*3/uL (ref 0.1–1.0)
Monocytes Relative: 7.7 % (ref 3.0–12.0)
Neutro Abs: 3.9 10*3/uL (ref 1.4–7.7)
Neutrophils Relative %: 63.5 % (ref 43.0–77.0)
Platelets: 220 10*3/uL (ref 150.0–400.0)
RBC: 4.18 Mil/uL (ref 3.87–5.11)
RDW: 14.6 % (ref 11.5–15.5)
WBC: 6.2 10*3/uL (ref 4.0–10.5)

## 2021-11-13 LAB — LIPID PANEL
Cholesterol: 204 mg/dL — ABNORMAL HIGH (ref 0–200)
HDL: 64.1 mg/dL (ref >39.00)
LDL Cholesterol: 116 mg/dL — ABNORMAL HIGH (ref 0–99)
NonHDL: 139.44
Total CHOL/HDL Ratio: 3
Triglycerides: 115 mg/dL (ref 0.0–149.0)
VLDL: 23 mg/dL (ref 0.0–40.0)

## 2021-11-13 MED ORDER — OLOPATADINE HCL 0.1 % OP SOLN: 1.0000 [drp] | Freq: Two times a day (BID) | OPHTHALMIC | 0 refills | Status: AC

## 2021-11-13 NOTE — Patient Instructions (Signed)
Eye drop sent to pharmacy for suspected allergies to eyes.   Stop by the lab prior to leaving today. I will notify you of your results once received.   It was a pleasure seeing you today! Please do not hesitate to reach out with any questions and or concerns.  Regards,   Eugenia Pancoast FNP-C

## 2021-11-13 NOTE — Progress Notes (Signed)
Kidney function mildly decreased pending results of urine culture increase water intake   Cholesterol only mildly elevated work on low cholesterol diet

## 2021-11-13 NOTE — Progress Notes (Signed)
Established Patient Office Visit  Subjective:  Patient ID: Madison Miller, female    DOB: 1950/11/16  Age: 71 y.o. MRN: [id number]  CC:  Chief Complaint  Patient presents with   transfer of care    HPI Madison Miller is here for a transition of care visit.  Prior provider was: Nicki Reaper, FNP  Pt is with acute concerns:   Colnoscopy : 2011, overdue.   Some gritty sensation in the last few days, with crusting in the am.  No yellow/green discharge.  No eye pain. Sometimes itch.   chronic concerns:  CHF: occasional chest pain, not recently, a few times a month. Occurs on left side of chest, squeezing sensation at times. Has not seen a cardiologist recently. Overdue. Needs referral.  D dimer one year ago 2.89. last note from cardiologist back in 2013 with Dr. Shirlee Latch.  Echocardiogram back 11/02/13 with 56% EF , LVH, tr and mr  Paroxysmal SVT: controlled on toprol XL. Chronic dx. No recent exacerbations per pt, denies racing heart rate. Last note does reveal long h/o atypical chest pain, adenosine myoview in 2010 with probable breast attenuation.  The study was thought to be normal  GERD: pt states heartrburn has been controlled.    Past Medical History:  Diagnosis Date   CHF (congestive heart failure) (HCC)    GERD (gastroesophageal reflux disease)    HTN (hypertension)    Hyperparathyroidism    Migraine, unspecified, with intractable migraine, so stated, without mention of status migrainosus    Right bundle branch block and left anterior fascicular block    SVT (supraventricular tachycardia) (HCC)     Past Surgical History:  Procedure Laterality Date   LUMBAR LAMINECTOMY  2010   Dr. Phoebe Perch   PARATHYROIDECTOMY  2009    Family History  Problem Relation Age of Onset   Other Mother        barin tumor   Stroke Father    Colon cancer Neg Hx    Rectal cancer Neg Hx    Stomach cancer Neg Hx     Social History   Socioeconomic History   Marital status:  Widowed    Spouse name: Not on file   Number of children: 6   Years of education: Not on file   Highest education level: Not on file  Occupational History   Not on file  Tobacco Use   Smoking status: Never   Smokeless tobacco: Never  Vaping Use   Vaping Use: Never used  Substance and Sexual Activity   Alcohol use: No    Alcohol/week: 0.0 standard drinks   Drug use: No   Sexual activity: Never  Other Topics Concern   Not on file  Social History Narrative   Not on file   Social Determinants of Health   Financial Resource Strain: Not on file  Food Insecurity: Not on file  Transportation Needs: Not on file  Physical Activity: Not on file  Stress: Not on file  Social Connections: Not on file  Intimate Partner Violence: Not on file    Outpatient Medications Prior to Visit  Medication Sig Dispense Refill   acetaminophen (TYLENOL) 500 MG tablet Take 500 mg by mouth as needed for moderate pain or headache.      furosemide (LASIX) 40 MG tablet Take 1 tablet (40 mg total) by mouth daily. 90 tablet 1   lisinopril (ZESTRIL) 5 MG tablet TAKE 1 TABLET (5 MG TOTAL) BY MOUTH DAILY. 90 tablet 1  metoprolol succinate (TOPROL-XL) 25 MG 24 hr tablet TAKE 1/2 TABLET BY MOUTH AS NEEDED (HEART FAILURE) 45 tablet 1   fluticasone (FLONASE) 50 MCG/ACT nasal spray SPRAY 2 SPRAYS INTO EACH NOSTRIL EVERY DAY (Patient taking differently: Place 2 sprays into both nostrils as needed for allergies.) 16 mL 1   naproxen (NAPROSYN) 375 MG tablet Take 1 tablet (375 mg total) by mouth 2 (two) times daily with a meal. 6 tablet 0   No facility-administered medications prior to visit.    Allergies  Allergen Reactions   Lactose Intolerance (Gi) Other (See Comments)    Pain,bloating    ROS Review of Systems  Review of Systems  Respiratory:  Negative for shortness of breath.   Cardiovascular:  occasional chest pain not at current, when occurs squeezing sensation left side of check, no le edema no  palpiations no sob.  Gastrointestinal:  Negative for constipation and diarrhea.  Genitourinary:  Negative for dysuria, frequency and urgency.  Musculoskeletal:  Negative for myalgias.  Psychiatric/Behavioral:  Negative for depression and suicidal ideas.   All other systems reviewed and are negative.    Objective:    Physical Exam  Gen: NAD, resting comfortably CV: RRR with no murmurs appreciated Pulm: NWOB, CTAB with no crackles, wheezes, or rhonchi Skin: warm, dry Psych: Normal affect and thought content  BP 102/68    Pulse 64    Ht 5\' 3"  (1.6 m)    Wt 122 lb (55.3 kg)    SpO2 98%    BMI 21.61 kg/m  Wt Readings from Last 3 Encounters:  11/13/21 122 lb (55.3 kg)  03/27/21 104 lb 1 oz (47.2 kg)  01/09/21 108 lb (49 kg)     Health Maintenance Due  Topic Date Due   Pneumonia Vaccine 61+ Years old (1 - PCV) Never done   INFLUENZA VACCINE  Never done    There are no preventive care reminders to display for this patient.  Lab Results  Component Value Date   TSH 1.38 03/27/2021   Lab Results  Component Value Date   WBC 6.2 11/13/2021   HGB 12.5 11/13/2021   HCT 37.9 11/13/2021   MCV 90.8 11/13/2021   PLT 220.0 11/13/2021   Lab Results  Component Value Date   NA 139 11/13/2021   K 3.7 11/13/2021   CO2 37 (H) 11/13/2021   GLUCOSE 89 11/13/2021   BUN 21 11/13/2021   CREATININE 1.00 11/13/2021   BILITOT 0.7 11/13/2021   ALKPHOS 51 11/13/2021   AST 25 11/13/2021   ALT 21 11/13/2021   PROT 8.2 11/13/2021   ALBUMIN 4.6 11/13/2021   CALCIUM 10.0 11/13/2021   ANIONGAP 10 03/15/2020   GFR 57.22 (L) 11/13/2021   Lab Results  Component Value Date   CHOL 204 (H) 11/13/2021   Lab Results  Component Value Date   HDL 64.10 11/13/2021   Lab Results  Component Value Date   LDLCALC 116 (H) 11/13/2021   Lab Results  Component Value Date   TRIG 115.0 11/13/2021   Lab Results  Component Value Date   CHOLHDL 3 11/13/2021   Lab Results  Component Value Date    HGBA1C  11/23/2007    5.0 (NOTE)   The ADA recommends the following therapeutic goals for glycemic   control related to Hgb A1C measurement:   Goal of Therapy:   < 7.0% Hgb A1C   Action Suggested:  > 8.0% Hgb A1C   Ref:  Diabetes Care, 22, Suppl. 1, 1999  Assessment & Plan:   Problem List Items Addressed This Visit       Cardiovascular and Mediastinum   CHF (congestive heart failure) [facility])    Referral to cardiologist as patient to continue ongoing supervision from a cardiologist from this as well.  Monitor weight daily if any increasing greater than 2 pounds please let me know immediately.  Currently with no lower extremity edema no dyspnea on exertion or shortness of breath.      Relevant Orders   Ambulatory referral to Cardiology   CBC with Differential (Completed)   Essential hypertension    Continue metoprolol and lisinopril.  Low-sodium diet      Paroxysmal SVT (supraventricular tachycardia) (HCC)    Continue metoprolol rate controlled at visit      Relevant Orders   Ambulatory referral to Cardiology   CBC with Differential (Completed)     Respiratory   ALLERGIC RHINITIS, SEASONAL    Patient given Pataday for allergic conjunctivitis hopefully this will help with allergy to control as well Advised to start Flonase no spray once daily        Genitourinary   Nephrolithiasis   Relevant Orders   Comprehensive metabolic panel (Completed)     Other   Mixed hyperlipidemia - Primary    Ordering lipid panel pending results      Relevant Orders   Lipid panel (Completed)   History of chronic urinary tract infection    Currently asymptomatic urine culture ordered pending results      Relevant Orders   Urine Culture (Completed)   Comprehensive metabolic panel (Completed)   Allergic conjunctivitis of both eyes    Eyedrops sent to pharmacy patient to start with Flonase      Relevant Medications   olopatadine (PATADAY) 0.1 % ophthalmic solution    Meds ordered  this encounter  Medications   olopatadine (PATADAY) 0.1 % ophthalmic solution    Sig: Place 1 drop into both eyes 2 (two) times daily.    Dispense:  3 mL    Refill:  0    Order Specific Question:   Supervising Provider    Answer:   Ermalene Searing, AMY E [[id number]]    Follow-up: Return in about 3 months (around 02/10/2022) for annual physical exam.    Mort Sawyers, FNP

## 2021-11-15 LAB — URINE CULTURE
MICRO NUMBER:: 13054626
SPECIMEN QUALITY:: ADEQUATE

## 2021-11-16 ENCOUNTER — Other Ambulatory Visit: Payer: Self-pay | Admitting: Family

## 2021-11-16 DIAGNOSIS — H1013 Acute atopic conjunctivitis, bilateral: Secondary | ICD-10-CM | POA: Insufficient documentation

## 2021-11-16 DIAGNOSIS — Z87440 Personal history of urinary (tract) infections: Secondary | ICD-10-CM | POA: Insufficient documentation

## 2021-11-16 DIAGNOSIS — N200 Calculus of kidney: Secondary | ICD-10-CM | POA: Insufficient documentation

## 2021-11-16 DIAGNOSIS — N3000 Acute cystitis without hematuria: Secondary | ICD-10-CM

## 2021-11-16 DIAGNOSIS — E782 Mixed hyperlipidemia: Secondary | ICD-10-CM | POA: Insufficient documentation

## 2021-11-16 MED ORDER — AMOXICILLIN-POT CLAVULANATE 875-125 MG PO TABS: 1.0000 | ORAL_TABLET | Freq: Two times a day (BID) | ORAL | 0 refills | Status: AC

## 2021-11-16 NOTE — Assessment & Plan Note (Signed)
Ordering lipid panel pending results

## 2021-11-16 NOTE — Assessment & Plan Note (Signed)
Currently asymptomatic urine culture ordered pending results

## 2021-11-16 NOTE — Progress Notes (Signed)
Please do not send through MyChart  Patient was found to have positive UTI I have sent antibiotic to pharmacy please notify her to get started on this

## 2021-11-16 NOTE — Assessment & Plan Note (Signed)
Continue metoprolol rate controlled at visit

## 2021-11-16 NOTE — Assessment & Plan Note (Signed)
Referral to cardiologist as patient to continue ongoing supervision from a cardiologist from this as well.  Monitor weight daily if any increasing greater than 2 pounds please let me know immediately.  Currently with no lower extremity edema no dyspnea on exertion or shortness of breath.

## 2021-11-16 NOTE — Assessment & Plan Note (Addendum)
Patient given Pataday for allergic conjunctivitis hopefully this will help with allergy to control as well Advised to start Flonase no spray once daily

## 2021-11-16 NOTE — Assessment & Plan Note (Signed)
Continue metoprolol and lisinopril.  Low-sodium diet

## 2021-11-16 NOTE — Assessment & Plan Note (Signed)
Eyedrops sent to pharmacy patient to start with Flonase

## 2021-11-18 ENCOUNTER — Telehealth: Payer: Self-pay | Admitting: Family

## 2021-11-18 NOTE — Telephone Encounter (Signed)
Please advise 

## 2021-11-18 NOTE — Telephone Encounter (Signed)
?  Encourage patient to contact the pharmacy for refills or they can request refills through [facility] ? ?LAST APPOINTMENT DATE:  Please schedule appointment if longer than 1 year ? ?NEXT APPOINTMENT DATE: ? ?MEDICATION: metoprolol succinate (TOPROL-XL) 25 MG 24 hr tablet ? ?Is the patient out of medication? yes ? ?PHARMACY: [facility] ? ?Let patient know to contact pharmacy at the end of the day to make sure medication is ready. ? ?Please notify patient to allow 48-72 hours to process ? ?CLINICAL FILLS OUT ALL BELOW:  ? ?LAST REFILL: ? ?QTY: ? ?REFILL DATE: ? ? ? ?OTHER COMMENTS:  ? ? ?Okay for refill? ? ?Please advise ? ? ? ? ?

## 2021-11-19 ENCOUNTER — Other Ambulatory Visit: Payer: Self-pay | Admitting: Family

## 2021-11-19 DIAGNOSIS — I10 Essential (primary) hypertension: Secondary | ICD-10-CM

## 2021-11-19 MED ORDER — METOPROLOL SUCCINATE ER 25 MG PO TB24: 12.5000 mg | ORAL_TABLET | Freq: Every day | ORAL | 1 refills | Status: DC

## 2021-11-26 ENCOUNTER — Other Ambulatory Visit: Payer: Self-pay

## 2021-11-26 ENCOUNTER — Ambulatory Visit (INDEPENDENT_AMBULATORY_CARE_PROVIDER_SITE_OTHER)
Admission: RE | Admit: 2021-11-26 | Discharge: 2021-11-26 | Disposition: A | Discharge status: Home / Self Care | Payer: Medicare HMO | Source: Ambulatory Visit | Attending: Family Medicine | Admitting: Family Medicine

## 2021-11-26 ENCOUNTER — Ambulatory Visit (INDEPENDENT_AMBULATORY_CARE_PROVIDER_SITE_OTHER): Payer: Medicare HMO | Admitting: Family Medicine

## 2021-11-26 ENCOUNTER — Ambulatory Visit: Payer: Medicare HMO | Admitting: Family

## 2021-11-26 VITALS — BP 104/70 | HR 66 | Temp 98.0°F | Resp 14 | Ht 63.0 in | Wt 122.6 lb

## 2021-11-26 DIAGNOSIS — M542 Cervicalgia: Secondary | ICD-10-CM | POA: Diagnosis not present

## 2021-11-26 DIAGNOSIS — L989 Disorder of the skin and subcutaneous tissue, unspecified: Secondary | ICD-10-CM | POA: Insufficient documentation

## 2021-11-26 DIAGNOSIS — M2578 Osteophyte, vertebrae: Secondary | ICD-10-CM | POA: Diagnosis not present

## 2021-11-26 MED ORDER — DICLOFENAC SODIUM 1 % EX GEL: 2.0000 g | Freq: Four times a day (QID) | CUTANEOUS | 0 refills | Status: DC

## 2021-11-26 NOTE — Assessment & Plan Note (Signed)
Suspect likely some arthritis, given bony tenderness and age we will go ahead and get an x-ray today.  Advised trial of physical therapy she declined and said she would like to see what the x-ray shows.  Offered prednisone given some concern for tingling numbness though unable to elicit on exam due to discomfort with Spurling maneuver, she declined.  Advised Tylenol and prescription for Voltaren gel per provided due to some mild kidney disease. ?

## 2021-11-26 NOTE — Patient Instructions (Addendum)
X-ray today ?- Continue tylenol [phone] mg every 8 hours ?- Try voltaren gel topically as needed ?- consider physical therapy if not improving ? ?Dermatology for the scalp lesion ? ?Neck Exercises ?Ask your health care provider which exercises are safe for you. Do exercises exactly as told by your health care provider and adjust them as directed. It is normal to feel mild stretching, pulling, tightness, or discomfort as you do these exercises. Stop right away if you feel sudden pain or your pain gets worse. Do not begin these exercises until told by your health care provider. ?Neck exercises can be important for many reasons. They can improve strength and maintain flexibility in your neck, which will help your upper back and prevent neck pain. ?Stretching exercises ?Rotation neck stretching ? ?Sit in a chair or stand up. ?Place your feet flat on the floor, shoulder-width apart. ?Slowly turn your head (rotate) to the right until a slight stretch is felt. Turn it all the way to the right so you can look over your right shoulder. Do not tilt or tip your head. ?Hold this position for 10-30 seconds. ?Slowly turn your head (rotate) to the left until a slight stretch is felt. Turn it all the way to the left so you can look over your left shoulder. Do not tilt or tip your head. ?Hold this position for 10-30 seconds. ?Repeat __________ times. Complete this exercise __________ times a day. ?Neck retraction ? ?Sit in a sturdy chair or stand up. ?Look straight ahead. Do not bend your neck. ?Use your fingers to push your chin backward (retraction). Do not bend your neck for this movement. Continue to face straight ahead. If you are doing the exercise properly, you will feel a slight sensation in your throat and a stretch at the back of your neck. ?Hold the stretch for 1-2 seconds. ?Repeat __________ times. Complete this exercise __________ times a day. ?Strengthening exercises ?Neck press ? ?Lie on your back on a firm bed or on  the floor with a pillow under your head. ?Use your neck muscles to push your head down on the pillow and straighten your spine. ?Hold the position as well as you can. Keep your head facing up (in a neutral position) and your chin tucked. ?Slowly count to 5 while holding this position. ?Repeat __________ times. Complete this exercise __________ times a day. ?Isometrics ?These are exercises in which you strengthen the muscles in your neck while keeping your neck still (isometrics). ?Sit in a supportive chair and place your hand on your forehead. ?Keep your head and face facing straight ahead. Do not flex or extend your neck while doing isometrics. ?Push forward with your head and neck while pushing back with your hand. Hold for 10 seconds. ?Do the sequence again, this time putting your hand against the back of your head. Use your head and neck to push backward against the hand pressure. ?Finally, do the same exercise on either side of your head, pushing sideways against the pressure of your hand. ?Repeat __________ times. Complete this exercise __________ times a day. ?Prone head lifts ? ?Lie face-down (prone position), resting on your elbows so that your chest and upper back are raised. ?Start with your head facing downward, near your chest. Position your chin either on or near your chest. ?Slowly lift your head upward. Lift until you are looking straight ahead. Then continue lifting your head as far back as you can comfortably stretch. ?Hold your head up for 5 seconds.  Then slowly lower it to your starting position. ?Repeat __________ times. Complete this exercise __________ times a day. ?Supine head lifts ? ?Lie on your back (supine position), bending your knees to point to the ceiling and keeping your feet flat on the floor. ?Lift your head slowly off the floor, raising your chin toward your chest. ?Hold for 5 seconds. ?Repeat __________ times. Complete this exercise __________ times a day. ?Scapular  retraction ? ?Stand with your arms at your sides. Look straight ahead. ?Slowly pull both shoulders (scapulae) backward and downward (retraction) until you feel a stretch between your shoulder blades in your upper back. ?Hold for 10-30 seconds. ?Relax and repeat. ?Repeat __________ times. Complete this exercise __________ times a day. ?Contact a health care provider if: ?Your neck pain or discomfort gets worse when you do an exercise. ?Your neck pain or discomfort does not improve within 2 hours after you exercise. ?If you have any of these problems, stop exercising right away. Do not do the exercises again unless your health care provider says that you can. ?Get help right away if: ?You develop sudden, severe neck pain. ?If this happens, stop exercising right away. Do not do the exercises again unless your health care provider says that you can. ?This information is not intended to replace advice given to you by your health care provider. Make sure you discuss any questions you have with your health care provider. ?Document Revised: 03/03/2021 Document Reviewed: 03/03/2021 ?Elsevier Patient Education ? 2022 Elsevier Inc. ? ?

## 2021-11-26 NOTE — Progress Notes (Signed)
? ?Subjective:  ? ?  ?Madison Miller is a 71 y.o. female presenting for Acute Visit (Pt c/o neck pain ongoing for awhile, states that is has been getting worse. Denies any injury or falls. Pain comes and goes.) ?  ? ? ?Neck Pain  ?This is a new problem. The current episode started 1 to 4 weeks ago (worse over the last month). The problem occurs daily. The problem has been gradually worsening. The pain is associated with nothing. The pain is present in the midline. Quality: stiffness. The pain is at a severity of 5/10. Nothing aggravates the symptoms. The pain is Same all the time. Associated symptoms include numbness and tingling (arms, feet). Pertinent negatives include no fever, headaches, syncope, trouble swallowing, weakness or weight loss. She has tried acetaminophen, NSAIDs, heat and ice for the symptoms. The treatment provided mild relief.  ? ?#scalp lesion ?- comes and goes ?- will use shampoo - it will go away and then return ? ?Review of Systems  ?Constitutional:  Negative for fever and weight loss.  ?HENT:  Negative for trouble swallowing.   ?Cardiovascular:  Negative for syncope.  ?Musculoskeletal:  Positive for neck pain.  ?Neurological:  Positive for tingling (arms, feet) and numbness. Negative for weakness and headaches.  ? ? ?Social History  ? ?Tobacco Use  ?Smoking Status Never  ?Smokeless Tobacco Never  ? ? ? ?   ?Objective:  ?  ?BP Readings from Last 3 Encounters:  ?11/26/21 104/70  ?11/13/21 102/68  ?03/27/21 110/74  ? ?Wt Readings from Last 3 Encounters:  ?11/26/21 122 lb 9.6 oz (55.6 kg)  ?11/13/21 122 lb (55.3 kg)  ?03/27/21 104 lb 1 oz (47.2 kg)  ? ? ?BP 104/70 (BP Location: Left Arm, Patient Position: Sitting, Cuff Size: Normal)   Pulse 66   Temp 98 ?F (36.7 ?C) (Oral)   Resp 14   Ht 5\' 3"  (1.6 m)   Wt 122 lb 9.6 oz (55.6 kg)   SpO2 100%   BMI 21.72 kg/m?  ? ? ?Physical Exam ?Constitutional:   ?   General: She is not in acute distress. ?   Appearance: She is well-developed. She  is not diaphoretic.  ?HENT:  ?   Right Ear: External ear normal.  ?   Left Ear: External ear normal.  ?Eyes:  ?   Conjunctiva/sclera: Conjunctivae normal.  ?Cardiovascular:  ?   Rate and Rhythm: Normal rate.  ?Pulmonary:  ?   Effort: Pulmonary effort is normal.  ?Musculoskeletal:  ?   Cervical back: Rigidity present. Pain with movement and spinous process tenderness present. No muscular tenderness. Decreased range of motion (all directions decreased, but worse for rotation and flexion).  ?Skin: ?   General: Skin is warm and dry.  ?   Capillary Refill: Capillary refill takes less than 2 seconds.  ?   Comments: Scalp with scaly stuck on lesion with black coloration.   ?Neurological:  ?   Mental Status: She is alert. Mental status is at baseline.  ?Psychiatric:     ?   Mood and Affect: Mood normal.     ?   Behavior: Behavior normal.  ? ? ? ? ? ?   ?Assessment & Plan:  ? ?Problem List Items Addressed This Visit   ? ?  ? Musculoskeletal and Integument  ? Scalp lesion - Primary  ?  Probably a Seb K vs AK vs squamous cell.  Given how dark the lesion was even though raised advise dermatology referral  for further evaluation.  Did discuss that we could try freezing it but would recommend dermatology first. ?  ?  ? Relevant Orders  ? Ambulatory referral to Dermatology  ?  ? Other  ? Neck pain  ?  Suspect likely some arthritis, given bony tenderness and age we will go ahead and get an x-ray today.  Advised trial of physical therapy she declined and said she would like to see what the x-ray shows.  Offered prednisone given some concern for tingling numbness though unable to elicit on exam due to discomfort with Spurling maneuver, she declined.  Advised Tylenol and prescription for Voltaren gel per provided due to some mild kidney disease. ?  ?  ? Relevant Medications  ? diclofenac Sodium (VOLTAREN) 1 % GEL  ? Other Relevant Orders  ? DG Cervical Spine Complete  ? ? ? ?Return in about 6 weeks (around 01/07/2022). ? ?Lesleigh Noe, MD ? ?This visit occurred during the SARS-CoV-2 public health emergency.  Safety protocols were in place, including screening questions prior to the visit, additional usage of staff PPE, and extensive cleaning of exam room while observing appropriate contact time as indicated for disinfecting solutions.  ? ?

## 2021-11-26 NOTE — Assessment & Plan Note (Signed)
Probably a Seb K vs AK vs squamous cell.  Given how dark the lesion was even though raised advise dermatology referral for further evaluation.  Did discuss that we could try freezing it but would recommend dermatology first. ?

## 2021-11-27 ENCOUNTER — Encounter: Payer: Self-pay | Admitting: Family Medicine

## 2021-11-27 DIAGNOSIS — M8580 Other specified disorders of bone density and structure, unspecified site: Secondary | ICD-10-CM | POA: Insufficient documentation

## 2021-11-27 NOTE — Progress Notes (Signed)
Thanks, appreciate you seeing her.

## 2021-11-27 NOTE — Progress Notes (Signed)
Thanks, appreciate you seeing her.

## 2021-12-01 ENCOUNTER — Telehealth: Payer: Self-pay | Admitting: Family

## 2021-12-01 NOTE — Telephone Encounter (Signed)
Pt would like a call back to discuss her Xray concerning  her neck. Please advise. ?

## 2021-12-01 NOTE — Telephone Encounter (Signed)
Notified pt again regarding Vitamin D [phone]. ?

## 2021-12-28 ENCOUNTER — Ambulatory Visit (INDEPENDENT_AMBULATORY_CARE_PROVIDER_SITE_OTHER): Payer: Medicare HMO | Admitting: Cardiovascular Disease

## 2021-12-28 ENCOUNTER — Encounter: Payer: Self-pay | Admitting: Cardiovascular Disease

## 2021-12-28 VITALS — BP 90/70 | HR 67 | Ht 65.0 in | Wt 118.0 lb

## 2021-12-28 DIAGNOSIS — R079 Chest pain, unspecified: Secondary | ICD-10-CM

## 2021-12-28 DIAGNOSIS — I471 Supraventricular tachycardia: Secondary | ICD-10-CM | POA: Diagnosis not present

## 2021-12-28 DIAGNOSIS — I509 Heart failure, unspecified: Secondary | ICD-10-CM | POA: Diagnosis not present

## 2021-12-28 DIAGNOSIS — I10 Essential (primary) hypertension: Secondary | ICD-10-CM

## 2021-12-28 DIAGNOSIS — E782 Mixed hyperlipidemia: Secondary | ICD-10-CM

## 2021-12-28 MED ORDER — POTASSIUM CHLORIDE ER 10 MEQ PO TBCR: 10.0000 meq | EXTENDED_RELEASE_TABLET | Freq: Every day | ORAL | 3 refills | Status: DC

## 2021-12-28 NOTE — Patient Instructions (Addendum)
Medication Instructions:  ? ?Start Potassium 10 meq once daily ? ?If you need a refill on your cardiac medications before your next appointment, please call your pharmacy.  ? ?Lab work: ?No new labs needed ? ?Testing/Procedures: ? ?Your physician has requested that you have an echocardiogram. Echocardiography is a painless test that uses sound waves to create images of your heart. It provides your doctor with information about the size and shape of your heart and how well your heart?s chambers and valves are working. This procedure takes approximately one hour. There are no restrictions for this procedure.  ? ?Follow-Up: ?At [facility], you and your health needs are our priority.  As part of our continuing mission to provide you with exceptional heart care, we have created designated Provider Care Teams.  These Care Teams include your primary Cardiologist (physician) and Advanced Practice Providers (APPs -  Physician Assistants and Nurse Practitioners) who all work together to provide you with the care you need, when you need it. ? ?You will need a follow up appointment in 12 months ? ?Providers on your designated Care Team:   ?Nicolasa Ducking, NP ?Eula Listen, PA-C ?Cadence Fransico Michael, PA-C ? ?COVID-19 Vaccine Information can be found at: [email or web address] For questions related to vaccine distribution or appointments, please email [email or web address] or call [phone].  ? ?

## 2021-12-28 NOTE — Progress Notes (Signed)
Cardiology Office Note ? ?Date:  12/28/2021  ? ?ID:  Madison Miller, DOB [birth year], MRN [id number] ? ?PCP:  Mort Sawyers, FNP  ? ?Chief Complaint  ?Patient presents with  ? New Patient (Initial Visit)  ?  Ref by Mort Sawyers, FNP for unspecified heart failure & Paroxysmal SVT. Medications reviewed by the patient verbally. Patient c/o chest pain that comes and goes with occasional rapid heartbeats.   ? ? ?HPI:  ?Madison Miller is a 71 year old woman with past medical history of ?Hypertension ?SVT ?CHF on Lasix ?Atypical chest pain ?Referred by Mort Sawyers for consultation of her CHF, SVT ? ?Seen by cardiology in 2013, Dr. Shirlee Latch ?Prior medical history that he follow-up at that time ?1. Hyperparathyroidism. status post parathyroidectomy in March 2009.  ?2. History of SVT in the setting of hypercalcemia. Holter monitor done in January 2010, showed occasional PAC. There were no runs of SVT.  ?3. Lumbar spine disease.  lumbar spondylosis, lumbar degenerative disk disease, radiculopathy with pain down her right leg. s/p laminectomy in 2010.  ?4. Cardiac MRI in March 2009. This was done to evaluate for source of SVT. There is no evidence for infiltrative disease. EF was 58%.  ?5. Gastroesophageal reflux disease.  ?6. Chest pain. Adenosine Myoview done in January 2010, EF was 65%. There was some evidence for some breast attenuation. No evidence for ischemia.  ?7. RBB W/ LFAB (ICD-426.52)  ?9. MIGRAINE NOS W/INTRACTABLE MIGRAINE (ICD-346.91)  ?10. nephrolithiasis  ? ?Last echocardiogram February 2014, done at outside facility ?Normal ejection fraction at that time ? ?In follow-up today reports that she is doing relatively well ?Remains on Lasix 40 mg daily ?Blood pressure running low but no orthostasis ?Rare chest pain, last episode was few months ago ?Active but no regular exercise program ? ?EKG personally reviewed by myself on todays visit ?Normal sinus rhythm rate 67 bpm right bundle branch block no  significant ST-T wave changes ? ?PMH:   has a past medical history of CHF (congestive heart failure) (HCC), GERD (gastroesophageal reflux disease), HTN (hypertension), Hyperparathyroidism, Migraine, unspecified, with intractable migraine, so stated, without mention of status migrainosus, Right bundle branch block and left anterior fascicular block, and SVT (supraventricular tachycardia) (HCC). ? ?PSH:    ?Past Surgical History:  ?Procedure Laterality Date  ? LUMBAR LAMINECTOMY  2010  ? Dr. Phoebe Perch  ? PARATHYROIDECTOMY  2009  ? ? ?Current Outpatient Medications  ?Medication Sig Dispense Refill  ? acetaminophen (TYLENOL) 500 MG tablet Take 500 mg by mouth as needed for moderate pain or headache.     ? diclofenac Sodium (VOLTAREN) 1 % GEL Apply 2 g topically 4 (four) times daily. 50 g 0  ? furosemide (LASIX) 40 MG tablet Take 1 tablet (40 mg total) by mouth daily. 90 tablet 1  ? lisinopril (ZESTRIL) 5 MG tablet TAKE 1 TABLET (5 MG TOTAL) BY MOUTH DAILY. 90 tablet 1  ? meloxicam (MOBIC) 15 MG tablet Take 15 mg by mouth daily as needed.    ? metoprolol succinate (TOPROL-XL) 25 MG 24 hr tablet Take 0.5 tablets (12.5 mg total) by mouth daily. 90 tablet 1  ? ?No current facility-administered medications for this visit.  ? ? ? ?Allergies:   Lactose and Lactose intolerance (gi)  ? ?Social History:  The patient  reports that she has never smoked. She has never used smokeless tobacco. She reports that she does not drink alcohol and does not use drugs.  ? ?Family History:   family history includes Other  in her mother; Stroke in her father.  ? ? ?Review of Systems: ?Review of Systems  ?Constitutional: Negative.   ?HENT: Negative.    ?Respiratory: Negative.    ?Cardiovascular: Negative.   ?Gastrointestinal: Negative.   ?Musculoskeletal: Negative.   ?Neurological: Negative.   ?Psychiatric/Behavioral: Negative.    ?All other systems reviewed and are negative. ? ? ?PHYSICAL EXAM: ?VS:  BP 90/70 (BP Location: Right Arm, Patient  Position: Sitting, Cuff Size: Normal)   Pulse 67   Ht 5\' 5"  (1.651 m)   Wt 53.5 kg   SpO2 98%   BMI 19.64 kg/m?  , BMI Body mass index is 19.64 kg/m? ?Constitutional:  oriented to person, place, and time. No distress.  ?HENT:  ?Head: Grossly normal ?Eyes:  no discharge. No scleral icterus.  ?Neck: No JVD, no carotid bruits  ?Cardiovascular: Regular rate and rhythm, no murmurs appreciated ?Pulmonary/Chest: Clear to auscultation bilaterally, no wheezes or rails ?Abdominal: Soft.  no distension.  no tenderness.  ?Musculoskeletal: Normal range of motion ?Neurological:  normal muscle tone. Coordination normal. No atrophy ?Skin: Skin warm and dry ?Psychiatric: normal affect, pleasant ? ?Recent Labs: ?03/27/2021: TSH 1.38 ?11/13/2021: ALT 21; BUN 21; Creatinine, Ser 1.00; Hemoglobin 12.5; Platelets 220.0; Potassium 3.7; Sodium 139  ? ? ?Lipid Panel ?Lab Results  ?Component Value Date  ? CHOL 204 (H) 11/13/2021  ? HDL 64.10 11/13/2021  ? LDLCALC 116 (H) 11/13/2021  ? TRIG 115.0 11/13/2021  ? ?  ? ?Wt Readings from Last 3 Encounters:  ?12/28/21 53.5 kg  ?11/26/21 55.6 kg  ?11/13/21 55.3 kg  ?  ? ?ASSESSMENT AND PLAN: ? ?Problem List Items Addressed This Visit   ? ? CHF (congestive heart failure) (HCC)  ? Essential hypertension  ? Paroxysmal SVT (supraventricular tachycardia) (HCC) - Primary  ? Mixed hyperlipidemia  ? ?Other Visit Diagnoses   ? ? Chest pain of uncertain etiology      ? ?  ? ?CHF, unspecified ?Previously seen by cardiology 10 years ago, reports she has been maintained on Lasix 40 mg daily ?Denies significant shortness of breath, no leg swelling no orthopnea PND ?Renal function stable ?-Prior echocardiogram 2014, repeat echo ordered to update our records ?Decrease Lasix down to 20 mg daily ?We did add potassium 10 today's visit ? ?Paroxysmal SVT ?Denies any recent episodes tachyarrhythmia ?On beta-blocker.  No changes to dosing given low blood pressure ? ?Atypical chest pain ?Rare episodes, not associated  with exertion, 1 episode several months ago ?Less likely ischemia, no further work-up at this time apart from echo as above ? ?Hypertension ?Possibly secondary to low weight ?Denies orthostasis symptoms ?No changes to medications at this time ? ? Total encounter time more than 60 minutes ? Greater than 50% was spent in counseling and coordination of care with the patient ? ? ? ?Signed, ?2015, M.D., Ph.D. ?[facility] [place], [place] ?[phone] ?

## 2022-01-18 ENCOUNTER — Ambulatory Visit (INDEPENDENT_AMBULATORY_CARE_PROVIDER_SITE_OTHER): Payer: Medicare HMO

## 2022-01-18 DIAGNOSIS — I471 Supraventricular tachycardia: Secondary | ICD-10-CM

## 2022-01-18 DIAGNOSIS — I509 Heart failure, unspecified: Secondary | ICD-10-CM | POA: Diagnosis not present

## 2022-01-18 LAB — ECHOCARDIOGRAM COMPLETE
AR max vel: 2.18 cm2
AV Area VTI: 2.65 cm2
AV Area mean vel: 2.39 cm2
AV Mean grad: 3 mmHg
AV Peak grad: 4.8 mmHg
Ao pk vel: 1.1 m/s
Area-P 1/2: 2.76 cm2
Calc EF: 57.8 %
S' Lateral: 2.6 cm
Single Plane A2C EF: 58.1 %
Single Plane A4C EF: 56.8 %

## 2022-01-19 ENCOUNTER — Telehealth: Payer: Self-pay | Admitting: Emergency Medicine

## 2022-01-19 NOTE — Telephone Encounter (Signed)
Called and spoke with patient. Results reviewed with patient, pt verbalized understanding,  questions (if any) answered.   ?

## 2022-01-19 NOTE — Telephone Encounter (Signed)
-----   Message from Minna Merritts, MD sent at 01/18/2022  5:45 PM EDT ----- ?Echocardiogram ?Low normal ejection fraction EF 50 to 55% ?Mild to moderate tricuspid valve regurgitation ?No significant changes from prior study ?

## 2022-02-01 ENCOUNTER — Ambulatory Visit (INDEPENDENT_AMBULATORY_CARE_PROVIDER_SITE_OTHER): Payer: Medicare HMO | Admitting: Family Medicine

## 2022-02-01 VITALS — BP 80/60 | HR 51 | Temp 97.2°F | Ht 63.0 in | Wt 118.4 lb

## 2022-02-01 DIAGNOSIS — I10 Essential (primary) hypertension: Secondary | ICD-10-CM

## 2022-02-01 DIAGNOSIS — N1831 Chronic kidney disease, stage 3a: Secondary | ICD-10-CM | POA: Insufficient documentation

## 2022-02-01 DIAGNOSIS — M542 Cervicalgia: Secondary | ICD-10-CM | POA: Diagnosis not present

## 2022-02-01 MED ORDER — PREDNISONE 20 MG PO TABS: ORAL_TABLET | ORAL | 0 refills | Status: AC

## 2022-02-01 NOTE — Assessment & Plan Note (Signed)
Endorses some lightheadedness. Already took lisinopril today. Advised taking 1/2 lisinopril tomorrow. Discussed that prednisone may increase BP. F/u in 2-3 weeks for bp check.  ?

## 2022-02-01 NOTE — Assessment & Plan Note (Signed)
Stable, but on ACE-I with low BP. Will decrease lisinopril and try to maintain if possible for kidney protection.  ?

## 2022-02-01 NOTE — Progress Notes (Signed)
? ?Subjective:  ? ?  ?Madison Miller is a 71 y.o. female presenting for Headache (X 3 days ), Back Pain (upper), and Neck Pain (X 3 days ) ?  ? ? ?Headache  ?Associated symptoms include back pain and neck pain.  ?Back Pain ?Associated symptoms include headaches.  ?Neck Pain  ?Associated symptoms include headaches.  ? ?#Neck pain ?- comes and goes ?- but has been continuous for 3 days ?- neck pain with tightness hurts with movement ?- pain radiates from neck down to the back ?- continues to have tingling in the arm and weakness  ? ?Also have upper back pain - x 3 days ?- does not ease off enough ?- has ointment which helps ? ?#HA ?- frontal location ?- not a migraine ? ?Treatment: ointment, tylenol, ointment, meloxicam w/ some improvement but no resolution ? ?#HTN ?- low bp ?- has a monitor ? ?Review of Systems  ?Musculoskeletal:  Positive for back pain and neck pain.  ?Neurological:  Positive for headaches.  ? ? ?Social History  ? ?Tobacco Use  ?Smoking Status Never  ?Smokeless Tobacco Never  ? ? ? ?   ?Objective:  ?  ?BP Readings from Last 3 Encounters:  ?02/01/22 (!) 80/60  ?12/28/21 90/70  ?11/26/21 104/70  ? ?Wt Readings from Last 3 Encounters:  ?02/01/22 118 lb 6 oz (53.7 kg)  ?12/28/21 118 lb (53.5 kg)  ?11/26/21 122 lb 9.6 oz (55.6 kg)  ? ? ?BP (!) 80/60   Pulse (!) 51   Temp (!) 97.2 ?F (36.2 ?C) (Temporal)   Ht 5\' 3"  (1.6 m)   Wt 118 lb 6 oz (53.7 kg)   SpO2 94%   BMI 20.97 kg/m?  ? ? ?Physical Exam ?Constitutional:   ?   General: She is not in acute distress. ?   Appearance: She is well-developed. She is not diaphoretic.  ?HENT:  ?   Right Ear: External ear normal.  ?   Left Ear: External ear normal.  ?   Nose: Nose normal.  ?Eyes:  ?   Conjunctiva/sclera: Conjunctivae normal.  ?Cardiovascular:  ?   Rate and Rhythm: Normal rate.  ?Pulmonary:  ?   Effort: Pulmonary effort is normal.  ?Musculoskeletal:  ?   Cervical back: Neck supple. No erythema or rigidity. Pain with movement and muscular  tenderness present. No spinous process tenderness. Decreased range of motion.  ?   Comments: Negative spurling ?Normal UE strength  ?Skin: ?   General: Skin is warm and dry.  ?   Capillary Refill: Capillary refill takes less than 2 seconds.  ?Neurological:  ?   Mental Status: She is alert. Mental status is at baseline.  ?Psychiatric:     ?   Mood and Affect: Mood normal.     ?   Behavior: Behavior normal.  ? ? ? ? ? ?   ?Assessment & Plan:  ? ?Problem List Items Addressed This Visit   ? ?  ? Cardiovascular and Mediastinum  ? Essential hypertension  ?  Endorses some lightheadedness. Already took lisinopril today. Advised taking 1/2 lisinopril tomorrow. Discussed that prednisone may increase BP. F/u in 2-3 weeks for bp check.  ? ?  ?  ?  ? Genitourinary  ? Stage 3a chronic kidney disease (CKD) (HCC)  ?  Stable, but on ACE-I with low BP. Will decrease lisinopril and try to maintain if possible for kidney protection.  ? ?  ?  ?  ? Other  ? Neck  pain - Primary  ?  Persistent and intermittent. Neck XR without disc disease but does endorse tingling. Discussed PT referral and prednisone course. If no improvement return to see Dr. Patsy Lager.  ? ?  ?  ? Relevant Medications  ? predniSONE (DELTASONE) 20 MG tablet  ? Other Relevant Orders  ? Ambulatory referral to Physical Therapy  ? ? ? ?Return in about 2 weeks (around 02/15/2022) for for blood pressure follow-up with PCP. ? ?Lynnda Child, MD ? ? ? ?

## 2022-02-01 NOTE — Assessment & Plan Note (Signed)
Persistent and intermittent. Neck XR without disc disease but does endorse tingling. Discussed PT referral and prednisone course. If no improvement return to see Dr. Patsy Lager.  ?

## 2022-02-01 NOTE — Patient Instructions (Addendum)
Take prednisone for 9 days ? ?Go to physical therapy ? ?Do not take meloxicam or ibuprofen or aleve while on prednisone ? ?OK to take with tylenol ? ? ?If not improving with physical therapy - make an appointment with Dr. Lorelei Pont (sports medicine)  ? ?#Low blood pressure ?- drink some water today ?- compression socks ?- slow transitions ?- check blood pressure daily ?- take 1/2 pill of lisinopril  ?- monitor blood pressure and return in 2 weeks for blood pressure check  ?

## 2022-02-11 ENCOUNTER — Encounter: Payer: Self-pay | Admitting: Family

## 2022-02-11 ENCOUNTER — Ambulatory Visit: Payer: Medicare HMO | Admitting: Nurse Practitioner

## 2022-02-11 ENCOUNTER — Ambulatory Visit (INDEPENDENT_AMBULATORY_CARE_PROVIDER_SITE_OTHER): Payer: Medicare HMO | Admitting: Family

## 2022-02-11 VITALS — BP 88/62 | HR 97 | Temp 98.6°F | Resp 16 | Ht 63.0 in | Wt 117.4 lb

## 2022-02-11 DIAGNOSIS — M79674 Pain in right toe(s): Secondary | ICD-10-CM

## 2022-02-11 DIAGNOSIS — M79675 Pain in left toe(s): Secondary | ICD-10-CM | POA: Diagnosis not present

## 2022-02-11 NOTE — Progress Notes (Signed)
Established Patient Office Visit  Subjective:  Patient ID: Madison Miller, female    DOB: 03-03-1951  Age: 71 y.o. MRN: [id number]  CC:  Chief Complaint  Patient presents with   Toe Pain     X few days right beside the pink toe on both feet.    HPI Madison Miller is here today with concerns.   Notices that her toes seem to hurt on both sides.  Been for the last two days.  Worse with walking at times. No known injury.  No swelling in legs.   Past Medical History:  Diagnosis Date   CHF (congestive heart failure) (HCC)    GERD (gastroesophageal reflux disease)    HTN (hypertension)    Hyperparathyroidism    Migraine, unspecified, with intractable migraine, so stated, without mention of status migrainosus    Right bundle branch block and left anterior fascicular block    SVT (supraventricular tachycardia) (HCC)     Past Surgical History:  Procedure Laterality Date   LUMBAR LAMINECTOMY  2010   Dr. Phoebe Perch   PARATHYROIDECTOMY  2009    Family History  Problem Relation Age of Onset   Other Mother        barin tumor   Stroke Father    Colon cancer Neg Hx    Rectal cancer Neg Hx    Stomach cancer Neg Hx     Social History   Socioeconomic History   Marital status: Widowed    Spouse name: Not on file   Number of children: 6   Years of education: Not on file   Highest education level: Not on file  Occupational History   Not on file  Tobacco Use   Smoking status: Never   Smokeless tobacco: Never  Vaping Use   Vaping Use: Never used  Substance and Sexual Activity   Alcohol use: No    Alcohol/week: 0.0 standard drinks   Drug use: No   Sexual activity: Never  Other Topics Concern   Not on file  Social History Narrative   Not on file   Social Determinants of Health   Financial Resource Strain: Not on file  Food Insecurity: Not on file  Transportation Needs: Not on file  Physical Activity: Not on file  Stress: Not on file  Social Connections:  Not on file  Intimate Partner Violence: Not on file    Outpatient Medications Prior to Visit  Medication Sig Dispense Refill   acetaminophen (TYLENOL) 500 MG tablet Take 500 mg by mouth as needed for moderate pain or headache.      diclofenac Sodium (VOLTAREN) 1 % GEL Apply 2 g topically 4 (four) times daily. 50 g 0   furosemide (LASIX) 40 MG tablet Take 1 tablet (40 mg total) by mouth daily. 90 tablet 1   lisinopril (ZESTRIL) 5 MG tablet TAKE 1 TABLET (5 MG TOTAL) BY MOUTH DAILY. 90 tablet 1   meloxicam (MOBIC) 15 MG tablet Take 15 mg by mouth daily as needed.     metoprolol succinate (TOPROL-XL) 25 MG 24 hr tablet Take 0.5 tablets (12.5 mg total) by mouth daily. 90 tablet 1   potassium chloride (KLOR-CON) 10 MEQ tablet Take 1 tablet (10 mEq total) by mouth daily. 90 tablet 3   No facility-administered medications prior to visit.    Allergies  Allergen Reactions   Lactose Other (See Comments)    Pain,bloating   Lactose Intolerance (Gi) Other (See Comments)    Pain,bloating  ROS Review of Systems  Constitutional:  Negative for chills, fatigue and fever.  Cardiovascular:  Negative for leg swelling.  Musculoskeletal:  Positive for arthralgias (tenderness to bil fourth metatarsal).     Objective:    Physical Exam Constitutional:      General: She is not in acute distress.    Appearance: Normal appearance. She is normal weight. She is not ill-appearing, toxic-appearing or diaphoretic.  Pulmonary:     Effort: Pulmonary effort is normal.  Musculoskeletal:     Right foot: Normal range of motion. Deformity and bunion present.     Left foot: Normal range of motion. Deformity and bunion present.  Feet:     Right foot:     Skin integrity: Skin breakdown (right fourth metatarpal with irritation and tenderness on medial aspect) present.     Left foot:     Skin integrity: Skin breakdown (right fourth metatarpal with irritation and tenderness on medial aspect) present.   Neurological:     General: No focal deficit present.     Mental Status: She is alert. Mental status is at baseline.  Psychiatric:        Mood and Affect: Mood normal.        Behavior: Behavior normal.        Thought Content: Thought content normal.        Judgment: Judgment normal.    BP (!) 88/62   Pulse 97   Temp 98.6 F (37 C)   Resp 16   Ht 5\' 3"  (1.6 m)   Wt 117 lb 7 oz (53.3 kg)   SpO2 99%   BMI 20.80 kg/m  Wt Readings from Last 3 Encounters:  02/11/22 117 lb 7 oz (53.3 kg)  02/01/22 118 lb 6 oz (53.7 kg)  12/28/21 118 lb (53.5 kg)     Health Maintenance Due  Topic Date Due   Pneumonia Vaccine 1065+ Years old (1 - PCV) Never done    There are no preventive care reminders to display for this patient.  Lab Results  Component Value Date   TSH 1.38 03/27/2021   Lab Results  Component Value Date   WBC 6.2 11/13/2021   HGB 12.5 11/13/2021   HCT 37.9 11/13/2021   MCV 90.8 11/13/2021   PLT 220.0 11/13/2021   Lab Results  Component Value Date   NA 139 11/13/2021   K 3.7 11/13/2021   CO2 37 (H) 11/13/2021   GLUCOSE 89 11/13/2021   BUN 21 11/13/2021   CREATININE 1.00 11/13/2021   BILITOT 0.7 11/13/2021   ALKPHOS 51 11/13/2021   AST 25 11/13/2021   ALT 21 11/13/2021   PROT 8.2 11/13/2021   ALBUMIN 4.6 11/13/2021   CALCIUM 10.0 11/13/2021   ANIONGAP 10 03/15/2020   GFR 57.22 (L) 11/13/2021   Lab Results  Component Value Date   HGBA1C  11/23/2007    5.0 (NOTE)   The ADA recommends the following therapeutic goals for glycemic   control related to Hgb A1C measurement:   Goal of Therapy:   < 7.0% Hgb A1C   Action Suggested:  > 8.0% Hgb A1C   Ref:  Diabetes Care, 22, Suppl. 1, 1999      Assessment & Plan:   Problem List Items Addressed This Visit       Other   Toe pain, bilateral - Primary    Suspected due to irritation from pressure from deformity of toes.  Referral to podiatry.  Advised pt to try to reduce pressure  by not wearing high arcs, using  supports, and wearing supportive shoes.        Relevant Orders   Ambulatory referral to Podiatry    No orders of the defined types were placed in this encounter.   Follow-up: Return if symptoms worsen or fail to improve.    Mort Sawyers, FNP

## 2022-02-11 NOTE — Patient Instructions (Signed)
A referral was placed today for podiatry. Please let us know if you have not heard back within 2 weeks about the referral.  Tylenol as needed.  Wear some flatter shoes , with some orthopedic support.   Due to recent changes in healthcare laws, you may see results of your imaging and/or laboratory studies on MyChart before I have had a chance to review them.  I understand that in some cases there may be results that are confusing or concerning to you. Please understand that not all results are received at the same time and often I may need to interpret multiple results in order to provide you with the best plan of care or course of treatment. Therefore, I ask that you please give me 2 business days to thoroughly review all your results before contacting my office for clarification. Should we see a critical lab result, you will be contacted sooner.   It was a pleasure seeing you today! Please do not hesitate to reach out with any questions and or concerns.  Regards,   Eugenia Pancoast FNP-C

## 2022-02-16 DIAGNOSIS — M79674 Pain in right toe(s): Secondary | ICD-10-CM | POA: Insufficient documentation

## 2022-02-16 DIAGNOSIS — M79675 Pain in left toe(s): Secondary | ICD-10-CM | POA: Insufficient documentation

## 2022-02-16 NOTE — Assessment & Plan Note (Signed)
Suspected due to irritation from pressure from deformity of toes.  Referral to podiatry.  Advised pt to try to reduce pressure by not wearing high arcs, using supports, and wearing supportive shoes.

## 2022-02-17 ENCOUNTER — Encounter: Payer: Self-pay | Admitting: Emergency Medicine

## 2022-02-17 ENCOUNTER — Ambulatory Visit
Admission: EM | Admit: 2022-02-17 | Discharge: 2022-02-17 | Disposition: A | Discharge status: Home / Self Care | Payer: Medicare HMO | Attending: Emergency Medicine | Admitting: Emergency Medicine

## 2022-02-17 DIAGNOSIS — S0081XA Abrasion of other part of head, initial encounter: Secondary | ICD-10-CM

## 2022-02-17 DIAGNOSIS — J069 Acute upper respiratory infection, unspecified: Secondary | ICD-10-CM

## 2022-02-17 MED ORDER — FLUTICASONE PROPIONATE 50 MCG/ACT NA SUSP: 2.0000 | Freq: Every day | NASAL | 0 refills | Status: DC

## 2022-02-17 MED ORDER — MUPIROCIN 2 % EX OINT: 1.0000 "application " | TOPICAL_OINTMENT | Freq: Two times a day (BID) | CUTANEOUS | 0 refills | Status: DC

## 2022-02-17 NOTE — ED Triage Notes (Signed)
Pt presents with a bump on her head that she scratched 3 days ago that is hurting her. It is currently scabbed over. Pt also states she has mucus in her throat x 2 days.

## 2022-02-17 NOTE — Discharge Instructions (Addendum)
Continue keeping the area clean.  It is in the process of healing.  Bactroban will help prevent infection.  Flonase, saline nasal irrigation with a Milta Deiters Med rinse and distilled water as often as you want for nasal congestion.  This will help with the postnasal drip.  Please follow-up with your primary care provider if this is not making better.

## 2022-02-17 NOTE — ED Provider Notes (Signed)
HPI  SUBJECTIVE:  Madison Miller is a 71 y.o. female who presents with 2 issues: First, she had a palpable "bump" on her forehead earlier this week, and she scratched it, trying to eradicate it.  She states it is now painful.  No erythema, swelling, drainage.  She has tried keeping clean with soap and water.  No alleviating factors.  No aggravating factors.  No contacts with MRSA.  Second, she reports postnasal drip and yellow mucus in her throat.  She states she is occasionally coughing up and blowing out this yellow mucus.  No nasal congestion, rhinorrhea, fevers, body aches, headaches, change in her baseline cough, shortness of breath, dyspnea on exertion.  No sneezing, itchy, watery eyes.  No known COVID exposure.  She did not get the COVID-vaccine.  No aggravating or alleviating factors.  She has not tried anything for this.  She has a past medical history of SVT, CHF, chronic kidney disease stage III.  No history of MRSA.  PCP: [facility]   Past Medical History:  Diagnosis Date   CHF (congestive heart failure) (HCC)    GERD (gastroesophageal reflux disease)    HTN (hypertension)    Hyperparathyroidism    Migraine, unspecified, with intractable migraine, so stated, without mention of status migrainosus    Right bundle branch block and left anterior fascicular block    SVT (supraventricular tachycardia) (HCC)     Past Surgical History:  Procedure Laterality Date   LUMBAR LAMINECTOMY  2010   Dr. Phoebe Perch   PARATHYROIDECTOMY  2009    Family History  Problem Relation Age of Onset   Other Mother        barin tumor   Stroke Father    Colon cancer Neg Hx    Rectal cancer Neg Hx    Stomach cancer Neg Hx     Social History   Tobacco Use   Smoking status: Never   Smokeless tobacco: Never  Vaping Use   Vaping Use: Never used  Substance Use Topics   Alcohol use: No    Alcohol/week: 0.0 standard drinks   Drug use: No    No current facility-administered medications  for this encounter.  Current Outpatient Medications:    fluticasone (FLONASE) 50 MCG/ACT nasal spray, Place 2 sprays into both nostrils daily., Disp: 16 g, Rfl: 0   mupirocin ointment (BACTROBAN) 2 %, Apply 1 application. topically 2 (two) times daily., Disp: 22 g, Rfl: 0   acetaminophen (TYLENOL) 500 MG tablet, Take 500 mg by mouth as needed for moderate pain or headache. , Disp: , Rfl:    diclofenac Sodium (VOLTAREN) 1 % GEL, Apply 2 g topically 4 (four) times daily., Disp: 50 g, Rfl: 0   furosemide (LASIX) 40 MG tablet, Take 1 tablet (40 mg total) by mouth daily., Disp: 90 tablet, Rfl: 1   lisinopril (ZESTRIL) 5 MG tablet, TAKE 1 TABLET (5 MG TOTAL) BY MOUTH DAILY., Disp: 90 tablet, Rfl: 1   meloxicam (MOBIC) 15 MG tablet, Take 15 mg by mouth daily as needed., Disp: , Rfl:    metoprolol succinate (TOPROL-XL) 25 MG 24 hr tablet, Take 0.5 tablets (12.5 mg total) by mouth daily., Disp: 90 tablet, Rfl: 1   potassium chloride (KLOR-CON) 10 MEQ tablet, Take 1 tablet (10 mEq total) by mouth daily., Disp: 90 tablet, Rfl: 3  Allergies  Allergen Reactions   Lactose Other (See Comments)    Pain,bloating   Lactose Intolerance (Gi) Other (See Comments)    Pain,bloating  ROS  As noted in HPI.   Physical Exam  BP 127/81 (BP Location: Right Arm)   Pulse 68   Temp 97.6 F (36.4 C) (Oral)   Resp 16   SpO2 98%   Constitutional: Well developed, well nourished, no acute distress Eyes:  EOMI, conjunctiva normal bilaterally HENT: Normocephalic, atraumatic,mucus membranes moist.  Positive nasal congestion.  No maxillary, frontal sinus tenderness.  Normal tonsils, normal oropharynx.  Positive postnasal drip and cobblestoning. Neck: No cervical lymphadenopathy Respiratory: Normal inspiratory effort, lungs clear bilaterally Cardiovascular: Normal rate, regular rhythm, no murmurs GI: nondistended skin: Tender scab on forehead with no expressible purulent drainage, surrounding erythema,  induration, swelling    Musculoskeletal: no deformities Neurologic: Alert & oriented x 3, no focal neuro deficits Psychiatric: Speech and behavior appropriate   ED Course   Medications - No data to display  No orders of the defined types were placed in this encounter.   No results found for this or any previous visit (from the past 24 hour(s)). No results found.  ED Clinical Impression  1. Scratch of face, initial encounter   2. Acute upper respiratory infection      ED Assessment/Plan  1.  Facial scratch-appears to be in the process of healing.  No obvious signs of infection.  However, will send home with Bactroban for prevention.  Do not think that we need oral antibiotics at this time.  2.  Postnasal drip-appears to be an upper respiratory infection with allergic rhinitis in the differential.  Checking COVID.  will prescribe antivirals if COVID is positive due to multiple medical problems and unvaccinated status.  Flonase, saline nasal irrigation.  Patient declined COVID testing  Follow-up with PCP if not getting any better.  Discussed labs, MDM, treatment plan, and plan for follow-up with patient. patient agrees with plan.   Meds ordered this encounter  Medications   mupirocin ointment (BACTROBAN) 2 %    Sig: Apply 1 application. topically 2 (two) times daily.    Dispense:  22 g    Refill:  0   fluticasone (FLONASE) 50 MCG/ACT nasal spray    Sig: Place 2 sprays into both nostrils daily.    Dispense:  16 g    Refill:  0      *This clinic note was created using Scientist, clinical (histocompatibility and immunogenetics). Therefore, there may be occasional mistakes despite careful proofreading.  ?    Domenick Gong, MD 02/17/22 1301

## 2022-02-21 ENCOUNTER — Other Ambulatory Visit: Payer: Self-pay | Admitting: Internal Medicine

## 2022-02-23 ENCOUNTER — Ambulatory Visit: Payer: Medicare HMO | Admitting: Podiatry

## 2022-02-23 DIAGNOSIS — M2041 Other hammer toe(s) (acquired), right foot: Secondary | ICD-10-CM

## 2022-02-23 DIAGNOSIS — M2010 Hallux valgus (acquired), unspecified foot: Secondary | ICD-10-CM

## 2022-02-23 DIAGNOSIS — M2042 Other hammer toe(s) (acquired), left foot: Secondary | ICD-10-CM | POA: Diagnosis not present

## 2022-02-23 NOTE — Progress Notes (Signed)
Subjective:  Patient ID: Madison Miller, female    DOB: 1951/06/08,  MRN: [id number]  Chief Complaint  Patient presents with   Toe Pain    71 y.o. female presents with the above complaint.  Patient presents with complaint of bilateral third and fourth digit pain.  Patient has severe lateral deviation of foot.  She does not have any history of rheumatoid arthritis.  She does not want to be worked up for rheumatoid arthritis.  She states that she wears tight shoes and with heel.  She denies any other acute complaint she wanted to discuss other options of treatment besides surgical she does not want surgery   Review of Systems: Negative except as noted in the HPI. Denies N/V/F/Ch.  Past Medical History:  Diagnosis Date   CHF (congestive heart failure) (HCC)    GERD (gastroesophageal reflux disease)    HTN (hypertension)    Hyperparathyroidism    Migraine, unspecified, with intractable migraine, so stated, without mention of status migrainosus    Right bundle branch block and left anterior fascicular block    SVT (supraventricular tachycardia) (HCC)     Current Outpatient Medications:    acetaminophen (TYLENOL) 500 MG tablet, Take 500 mg by mouth as needed for moderate pain or headache. , Disp: , Rfl:    diclofenac Sodium (VOLTAREN) 1 % GEL, Apply 2 g topically 4 (four) times daily., Disp: 50 g, Rfl: 0   fluticasone (FLONASE) 50 MCG/ACT nasal spray, Place 2 sprays into both nostrils daily., Disp: 16 g, Rfl: 0   furosemide (LASIX) 40 MG tablet, Take 1 tablet (40 mg total) by mouth daily., Disp: 90 tablet, Rfl: 1   lisinopril (ZESTRIL) 5 MG tablet, TAKE 1 TABLET (5 MG TOTAL) BY MOUTH DAILY., Disp: 90 tablet, Rfl: 1   meloxicam (MOBIC) 15 MG tablet, Take 15 mg by mouth daily as needed., Disp: , Rfl:    metoprolol succinate (TOPROL-XL) 25 MG 24 hr tablet, Take 0.5 tablets (12.5 mg total) by mouth daily., Disp: 90 tablet, Rfl: 1   mupirocin ointment (BACTROBAN) 2 %, Apply 1 application.  topically 2 (two) times daily., Disp: 22 g, Rfl: 0   potassium chloride (KLOR-CON) 10 MEQ tablet, Take 1 tablet (10 mEq total) by mouth daily., Disp: 90 tablet, Rfl: 3  Social History   Tobacco Use  Smoking Status Never  Smokeless Tobacco Never    Allergies  Allergen Reactions   Lactose Other (See Comments)    Pain,bloating   Lactose Intolerance (Gi) Other (See Comments)    Pain,bloating   Objective:  There were no vitals filed for this visit. There is no height or weight on file to calculate BMI. Constitutional Well developed. Well nourished.  Vascular Dorsalis pedis pulses palpable bilaterally. Posterior tibial pulses palpable bilaterally. Capillary refill normal to all digits.  No cyanosis or clubbing noted. Pedal hair growth normal.  Neurologic Normal speech. Oriented to person, place, and time. Epicritic sensation to light touch grossly present bilaterally.  Dermatologic Nails well groomed and normal in appearance. No open wounds. No skin lesions.  Orthopedic:  Pain on palpation to bilateral bunion deformity that appears to be severe in nature.  This is a track bound deformity nontracking deformity.  There is mild crepitus to the first metatarsophalangeal joint as well.  Hammertoe contracture rigid with lateral deviation noted to bilateral toes.  Pain on palpation to right third digit PIPJ joint.  Pain with range of motion of the joint.  No pain at the MPJ joint  of the thyroid.   Radiographs: 3 views of skeletally mature adult bilateral foot: There is decreasing calcaneal inclination angle increase in talar declination angle anterior break in the cyma line.  There is ulnar deviation of the digits with hammertoe contractures noted.  There is subluxation of the first metatarsophalangeal joint with severe nature of the bunion deformity.  These findings are consistent with rheumatoid foot. Assessment:   No diagnosis found.  Plan:  Patient was evaluated and treated and all  questions answered.  Bilateral third and fourth digit lateral deviated toes/hammertoes -I explained the patient the etiology of the lateral deviation of the toes and his history of rheumatoid arthritis.  She does not want to do any work-up into rheumatoid arthritis.  I believe she will benefit from shoe gear modification as well as padding protecting pads.  I dispensed this.  She states understanding.  She will obtain them more if needed.  Bilateral bunions/hammertoes with underlying rheumatoid foot -I explained the patient the etiology of rheumatoid foot with development of bunions and hammertoes and various treatment options were discussed.  At this time patient only wants to discuss conservative care nonsurgical.  I discussed with her shoe gear modification including obtain new balance extrawide sneakers.  Patient states understanding.  She does not have any history that she knows of of rheumatoid arthritis.  She has not been worked up for rheumatoid arthritis.  At this time patient is not interested in follow-up with a rheumatologist  No follow-ups on file.

## 2022-04-16 ENCOUNTER — Ambulatory Visit (INDEPENDENT_AMBULATORY_CARE_PROVIDER_SITE_OTHER): Payer: Medicare HMO | Admitting: Nurse Practitioner

## 2022-04-16 DIAGNOSIS — M542 Cervicalgia: Secondary | ICD-10-CM

## 2022-04-16 MED ORDER — TIZANIDINE HCL 2 MG PO TABS: 2.0000 mg | ORAL_TABLET | Freq: Two times a day (BID) | ORAL | 0 refills | Status: DC | PRN

## 2022-04-16 MED ORDER — DICLOFENAC SODIUM 1 % EX GEL: 2.0000 g | Freq: Four times a day (QID) | CUTANEOUS | 0 refills | Status: DC

## 2022-04-16 NOTE — Assessment & Plan Note (Signed)
Neck pain without known injury in the paraspinal area left-sided greater than right.  Decreased range of motion.  Increased pain with movement.  Will do tizanidine 2 mg twice daily as needed sedation precautions were given renew Voltaren gel prescription apply to the area as needed.  Follow-up if no improvement

## 2022-04-16 NOTE — Progress Notes (Signed)
Acute Office Visit  Subjective:     Patient ID: Madison DARSEY, female    DOB: September 02, 1951, 71 y.o.   MRN: [id number]  Chief Complaint  Patient presents with   Neck Pain    Left side, radiates to the left arm, started 3 days ago, painful to move. No injury that patient knows of.     Patient is in today for NEck pain  States that it started 3 days ago. States that she woke that day and it was hurting and been progressing. Pain is described as a sharp pain. Hurts sitting there and worse with movement. States that she tired tylenol or ibuprofen without help Patient denies any sick contacts recently.  Patient has no history of cervical spine surgery.  Does have a history of parathyroidectomy and lumbar surgery.   Review of Systems  Constitutional:  Negative for chills and fever.  Respiratory:  Negative for shortness of breath.   Cardiovascular:  Negative for chest pain.  Musculoskeletal:  Positive for myalgias and neck pain. Negative for joint pain.  Neurological:  Negative for tingling and weakness.        Objective:    BP 126/78   Pulse 83   Temp 97.6 F (36.4 C)   Resp 16   Wt 115 lb 6 oz (52.3 kg)   SpO2 99%   BMI 20.44 kg/m    Physical Exam Vitals and nursing note reviewed.  Constitutional:      Appearance: Normal appearance.     Comments: Appears uncomfortable in office  Cardiovascular:     Rate and Rhythm: Normal rate and regular rhythm.     Pulses: Normal pulses.     Heart sounds: Normal heart sounds.  Pulmonary:     Effort: Pulmonary effort is normal.     Breath sounds: Normal breath sounds.  Musculoskeletal:        General: Tenderness present. No signs of injury.       Arms:     Cervical back: Rigidity and tenderness present. No bony tenderness. Pain with movement present. Decreased range of motion.     Comments: Patient would not participate in range of motion due to pain per patient report she states she could move neck and demonstrated  fashions in office.  Neurological:     Mental Status: She is alert.     Deep Tendon Reflexes:     Reflex Scores:      Bicep reflexes are 2+ on the right side and 2+ on the left side.    Comments: Bilateral upper extremity strength 5/5     No results found for any visits on 04/16/22.      Assessment & Plan:   Problem List Items Addressed This Visit       Other   Neck pain    Neck pain without known injury in the paraspinal area left-sided greater than right.  Decreased range of motion.  Increased pain with movement.  Will do tizanidine 2 mg twice daily as needed sedation precautions were given renew Voltaren gel prescription apply to the area as needed.  Follow-up if no improvement      Relevant Medications   diclofenac Sodium (VOLTAREN) 1 % GEL   tiZANidine (ZANAFLEX) 2 MG tablet    Meds ordered this encounter  Medications   diclofenac Sodium (VOLTAREN) 1 % GEL    Sig: Apply 2 g topically 4 (four) times daily.    Dispense:  50 g    Refill:  0    Order Specific Question:   Supervising Provider    Answer:   Roxy Manns A [[id number]]   tiZANidine (ZANAFLEX) 2 MG tablet    Sig: Take 1 tablet (2 mg total) by mouth 2 (two) times daily as needed for muscle spasms.    Dispense:  15 tablet    Refill:  0    Order Specific Question:   Supervising Provider    Answer:   TOWER, MARNE A [[id number]]    Return if symptoms worsen or fail to improve.  Audria Nine, NP

## 2022-04-16 NOTE — Patient Instructions (Signed)
Nice to see you today Let me know if you do not improve or your symptoms worsen I sent a cream and pill into the pharmacy. The muscle relaxer can make you sleepy so use caution

## 2022-05-10 ENCOUNTER — Other Ambulatory Visit: Payer: Self-pay | Admitting: Family

## 2022-05-25 NOTE — Telephone Encounter (Signed)
Pt overdue for appt Needs to be seen Will send thirty days of furosemide, but also overdue for labs. Need to check potassium and kidney functions w this medication.

## 2022-05-25 NOTE — Telephone Encounter (Signed)
Patient is out of this medication  Last pill taken 9.4.23  Last appt with PCP: 5.23.23 Next appt: Not scheduled

## 2022-05-26 ENCOUNTER — Ambulatory Visit: Payer: Medicare HMO | Admitting: Dermatology

## 2022-06-18 ENCOUNTER — Other Ambulatory Visit: Payer: Self-pay | Admitting: Family

## 2022-06-24 ENCOUNTER — Telehealth: Payer: Self-pay | Admitting: Family

## 2022-06-24 ENCOUNTER — Other Ambulatory Visit: Payer: Self-pay | Admitting: Family

## 2022-06-24 NOTE — Telephone Encounter (Signed)
  Encourage patient to contact the pharmacy for refills or they can request refills through [facility]  Did the patient contact the pharmacy: No  LAST APPOINTMENT DATE: 02/11/2022  NEXT APPOINTMENT DATE: N/A  MEDICATION: furosemide (LASIX) 40 MG tablet  lisinopril (ZESTRIL) 5 MG tablet  metoprolol succinate (TOPROL-XL) 25 MG 24 hr tablet  Is the patient out of medication? Has a few lisinopril left and out of others  PHARMACY: [id number] - [place], [place]  Let patient know to contact pharmacy at the end of the day to make sure medication is ready.  Please notify patient to allow 48-72 hours to process

## 2022-06-25 NOTE — Telephone Encounter (Signed)
Left message to return call to our office. Pt need appt to see Tabitha.

## 2022-06-29 ENCOUNTER — Other Ambulatory Visit: Payer: Self-pay | Admitting: Family

## 2022-06-29 DIAGNOSIS — I10 Essential (primary) hypertension: Secondary | ICD-10-CM

## 2022-06-29 DIAGNOSIS — I509 Heart failure, unspecified: Secondary | ICD-10-CM

## 2022-06-29 MED ORDER — FUROSEMIDE 40 MG PO TABS: 40.0000 mg | ORAL_TABLET | Freq: Every day | ORAL | 0 refills | Status: DC

## 2022-06-29 MED ORDER — METOPROLOL SUCCINATE ER 25 MG PO TB24: 12.5000 mg | ORAL_TABLET | Freq: Every day | ORAL | 0 refills | Status: DC

## 2022-06-29 MED ORDER — LISINOPRIL 5 MG PO TABS: 5.0000 mg | ORAL_TABLET | Freq: Every day | ORAL | 0 refills | Status: DC

## 2022-06-29 NOTE — Telephone Encounter (Signed)
Sending refill to [facility] on [address] so that patient has been out of medication I do see that she has appointment on October 12 and we will see her then

## 2022-06-29 NOTE — Telephone Encounter (Signed)
Called patient set up for office visit on 07/01/2022. She states she has been out of all her medications for 2 days. Do you want [place] to send a refill? Advised patient that if we had any recommendations or if you would call in short supply we would give call back.

## 2022-07-01 ENCOUNTER — Encounter: Payer: Self-pay | Admitting: Family

## 2022-07-01 ENCOUNTER — Ambulatory Visit (INDEPENDENT_AMBULATORY_CARE_PROVIDER_SITE_OTHER): Payer: Medicare HMO | Admitting: Family

## 2022-07-01 VITALS — BP 110/64 | HR 72 | Temp 97.5°F | Ht 63.0 in | Wt 120.0 lb

## 2022-07-01 DIAGNOSIS — I509 Heart failure, unspecified: Secondary | ICD-10-CM | POA: Diagnosis not present

## 2022-07-01 DIAGNOSIS — Z79899 Other long term (current) drug therapy: Secondary | ICD-10-CM

## 2022-07-01 DIAGNOSIS — B351 Tinea unguium: Secondary | ICD-10-CM | POA: Diagnosis not present

## 2022-07-01 DIAGNOSIS — M2550 Pain in unspecified joint: Secondary | ICD-10-CM

## 2022-07-01 DIAGNOSIS — I10 Essential (primary) hypertension: Secondary | ICD-10-CM

## 2022-07-01 DIAGNOSIS — Z5181 Encounter for therapeutic drug level monitoring: Secondary | ICD-10-CM

## 2022-07-01 LAB — COMPREHENSIVE METABOLIC PANEL
ALT: 10 U/L (ref 0–35)
AST: 15 U/L (ref 0–37)
Albumin: 4.3 g/dL (ref 3.5–5.2)
Alkaline Phosphatase: 45 U/L (ref 39–117)
BUN: 20 mg/dL (ref 6–23)
CO2: 32 mEq/L (ref 19–32)
Calcium: 9.7 mg/dL (ref 8.4–10.5)
Chloride: 103 mEq/L (ref 96–112)
Creatinine, Ser: 0.92 mg/dL (ref 0.40–1.20)
GFR: 62.96 mL/min (ref >60.00)
Glucose, Bld: 76 mg/dL (ref 70–99)
Potassium: 3.5 mEq/L (ref 3.5–5.1)
Sodium: 141 mEq/L (ref 135–145)
Total Bilirubin: 0.5 mg/dL (ref 0.2–1.2)
Total Protein: 7.6 g/dL (ref 6.0–8.3)

## 2022-07-01 LAB — SEDIMENTATION RATE: Sed Rate: 28 mm/hr (ref 0–30)

## 2022-07-01 MED ORDER — LISINOPRIL 5 MG PO TABS: 5.0000 mg | ORAL_TABLET | Freq: Every day | ORAL | 3 refills | Status: DC

## 2022-07-01 MED ORDER — CICLOPIROX 8 % EX SOLN: Freq: Every day | CUTANEOUS | 0 refills | Status: DC

## 2022-07-01 MED ORDER — FUROSEMIDE 20 MG PO TABS: 20.0000 mg | ORAL_TABLET | Freq: Every day | ORAL | 1 refills | Status: DC

## 2022-07-01 MED ORDER — METOPROLOL SUCCINATE ER 25 MG PO TB24: 12.5000 mg | ORAL_TABLET | Freq: Every day | ORAL | 3 refills | Status: DC

## 2022-07-01 NOTE — Progress Notes (Unsigned)
Established Patient Office Visit  Subjective:  Patient ID: Madison Miller, female    DOB: 12-13-1950  Age: 71 y.o. MRN: [id number]  CC:  Chief Complaint  Patient presents with   Medication Refill    Furosemide 40mg ., lisinopril 5mg , metoprolol succinate .5 tablets     HPI [name] is here today for follow up.   Pt seen by podiatry back in June and he suspected rheumatoid foot. She has no diagnosis of RA, denies any known fmh. She does have pain still with her left foot, all toes but they are significantly deformed as well.   Currently taking furosemide 40 mg once daiy as well as potassium chl 10 meq as she does have congestive heart failure. Followed by timothy gollan, cardiologist. Echocardiogram may 2023 with mild to moderate tricuspid valve regurt EG 50-55% , stable from prior study. Last visit with cardiologist was in April 2023, decreased to furosemide 20 mg as well as potassium 10 meq daily.   Past Medical History:  Diagnosis Date   CHF (congestive heart failure) (HCC)    GERD (gastroesophageal reflux disease)    HTN (hypertension)    Hyperparathyroidism    Migraine, unspecified, with intractable migraine, so stated, without mention of status migrainosus    Right bundle branch block and left anterior fascicular block    SVT (supraventricular tachycardia)     Past Surgical History:  Procedure Laterality Date   LUMBAR LAMINECTOMY  2010   Dr. 05-07-2000   PARATHYROIDECTOMY  2009    Family History  Problem Relation Age of Onset   Other Mother        barin tumor   Stroke Father    Colon cancer Neg Hx    Rectal cancer Neg Hx    Stomach cancer Neg Hx     Social History   Socioeconomic History   Marital status: Widowed    Spouse name: Not on file   Number of children: 6   Years of education: Not on file   Highest education level: Not on file  Occupational History   Not on file  Tobacco Use   Smoking status: Never   Smokeless tobacco: Never   Vaping Use   Vaping Use: Never used  Substance and Sexual Activity   Alcohol use: No    Alcohol/week: 0.0 standard drinks of alcohol   Drug use: No   Sexual activity: Never  Other Topics Concern   Not on file  Social History Narrative   Not on file   Social Determinants of Health   Financial Resource Strain: Not on file  Food Insecurity: Not on file  Transportation Needs: Not on file  Physical Activity: Not on file  Stress: Not on file  Social Connections: Not on file  Intimate Partner Violence: Not on file    Outpatient Medications Prior to Visit  Medication Sig Dispense Refill   acetaminophen (TYLENOL) 500 MG tablet Take 500 mg by mouth as needed for moderate pain or headache.      furosemide (LASIX) 40 MG tablet Take 1 tablet (40 mg total) by mouth daily. 30 tablet 0   lisinopril (ZESTRIL) 5 MG tablet Take 1 tablet (5 mg total) by mouth daily. 30 tablet 0   metoprolol succinate (TOPROL-XL) 25 MG 24 hr tablet Take 0.5 tablets (12.5 mg total) by mouth daily. 30 tablet 0   potassium chloride (KLOR-CON) 10 MEQ tablet Take 1 tablet (10 mEq total) by mouth daily. 90 tablet 3   diclofenac  Sodium (VOLTAREN) 1 % GEL Apply 2 g topically 4 (four) times daily. (Patient not taking: Reported on 07/01/2022) 50 g 0   fluticasone (FLONASE) 50 MCG/ACT nasal spray Place 2 sprays into both nostrils daily. (Patient not taking: Reported on 07/01/2022) 16 g 0   meloxicam (MOBIC) 15 MG tablet Take 15 mg by mouth daily as needed. (Patient not taking: Reported on 07/01/2022)     mupirocin ointment (BACTROBAN) 2 % Apply 1 application. topically 2 (two) times daily. (Patient not taking: Reported on 07/01/2022) 22 g 0   tiZANidine (ZANAFLEX) 2 MG tablet Take 1 tablet (2 mg total) by mouth 2 (two) times daily as needed for muscle spasms. (Patient not taking: Reported on 07/01/2022) 15 tablet 0   No facility-administered medications prior to visit.    Allergies  Allergen Reactions   Lactose Other (See  Comments)    Pain,bloating   Lactose Intolerance (Gi) Other (See Comments)    Pain,bloating        Objective:    Physical Exam Constitutional:      General: She is not in acute distress.    Appearance: Normal appearance. She is not ill-appearing, toxic-appearing or diaphoretic.  Cardiovascular:     Rate and Rhythm: Normal rate and regular rhythm.  Pulmonary:     Effort: Pulmonary effort is normal.     Breath sounds: Normal breath sounds.  Musculoskeletal:     Right lower leg: No edema.     Left lower leg: No edema.  Neurological:     General: No focal deficit present.     Mental Status: She is alert and oriented to person, place, and time. Mental status is at baseline.  Psychiatric:        Mood and Affect: Mood normal.        Behavior: Behavior normal.        Thought Content: Thought content normal.        Judgment: Judgment normal.       BP 110/64   Pulse 72   Temp (!) 97.5 F (36.4 C) (Temporal)   Ht 5\' 3"  (1.6 m)   Wt 120 lb (54.4 kg)   SpO2 98%   BMI 21.26 kg/m  Wt Readings from Last 3 Encounters:  07/01/22 120 lb (54.4 kg)  04/16/22 115 lb 6 oz (52.3 kg)  02/11/22 117 lb 7 oz (53.3 kg)     Health Maintenance Due  Topic Date Due   COLONOSCOPY (Pts 45-33yrs Insurance coverage will need to be confirmed)  05/05/2020    There are no preventive care reminders to display for this patient.  Lab Results  Component Value Date   TSH 1.38 03/27/2021   Lab Results  Component Value Date   WBC 6.2 11/13/2021   HGB 12.5 11/13/2021   HCT 37.9 11/13/2021   MCV 90.8 11/13/2021   PLT 220.0 11/13/2021   Lab Results  Component Value Date   NA 141 07/01/2022   K 3.5 07/01/2022   CO2 32 07/01/2022   GLUCOSE 76 07/01/2022   BUN 20 07/01/2022   CREATININE 0.92 07/01/2022   BILITOT 0.5 07/01/2022   ALKPHOS 45 07/01/2022   AST 15 07/01/2022   ALT 10 07/01/2022   PROT 7.6 07/01/2022   ALBUMIN 4.3 07/01/2022   CALCIUM 9.7 07/01/2022   ANIONGAP 10  03/15/2020   GFR 62.96 07/01/2022   Lab Results  Component Value Date   CHOL 204 (H) 11/13/2021   Lab Results  Component Value Date  HDL 64.10 11/13/2021   Lab Results  Component Value Date   LDLCALC 116 (H) 11/13/2021   Lab Results  Component Value Date   TRIG 115.0 11/13/2021   Lab Results  Component Value Date   CHOLHDL 3 11/13/2021   Lab Results  Component Value Date   HGBA1C  11/23/2007    5.0 (NOTE)   The ADA recommends the following therapeutic goals for glycemic   control related to Hgb A1C measurement:   Goal of Therapy:   < 7.0% Hgb A1C   Action Suggested:  > 8.0% Hgb A1C   Ref:  Diabetes Care, 22, Suppl. 1, 1999      Assessment & Plan:   Problem List Items Addressed This Visit       Cardiovascular and Mediastinum   CHF (congestive heart failure) (HCC)   Relevant Medications   lisinopril (ZESTRIL) 5 MG tablet   furosemide (LASIX) 20 MG tablet   metoprolol succinate (TOPROL-XL) 25 MG 24 hr tablet   Essential hypertension    Continue metoprolol 25 mg and lisinopril 5 mg once daily       Relevant Medications   lisinopril (ZESTRIL) 5 MG tablet   furosemide (LASIX) 20 MG tablet   metoprolol succinate (TOPROL-XL) 25 MG 24 hr tablet     Musculoskeletal and Integument   Toenail fungus - Primary    rx penlac solution        Relevant Medications   ciclopirox (PENLAC) 8 % solution     Other   Polyarthralgia    Antiinflammatory panel ordered pending results      Relevant Orders   Rheumatoid factor   ANA (Completed)   Sedimentation rate (Completed)   Other Visit Diagnoses     Encounter for monitoring diuretic therapy       Relevant Orders   Comprehensive metabolic panel (Completed)       Meds ordered this encounter  Medications   ciclopirox (PENLAC) 8 % solution    Sig: Apply topically at bedtime. Apply over nail and surrounding skin. Apply daily over previous coat. After seven (7) days, may remove with alcohol and continue cycle.     Dispense:  6.6 mL    Refill:  0    Order Specific Question:   Supervising Provider    Answer:   BEDSOLE, AMY E [[id number]]   lisinopril (ZESTRIL) 5 MG tablet    Sig: Take 1 tablet (5 mg total) by mouth daily.    Dispense:  90 tablet    Refill:  3    Order Specific Question:   Supervising Provider    Answer:   BEDSOLE, AMY E [[id number]]   furosemide (LASIX) 20 MG tablet    Sig: Take 1 tablet (20 mg total) by mouth daily.    Dispense:  90 tablet    Refill:  1    Needs appt prior to next refill    Order Specific Question:   Supervising Provider    Answer:   Diona Browner, AMY E [[id number]]   metoprolol succinate (TOPROL-XL) 25 MG 24 hr tablet    Sig: Take 0.5 tablets (12.5 mg total) by mouth daily.    Dispense:  90 tablet    Refill:  3    Order Specific Question:   Supervising Provider    Answer:   Diona Browner, AMY E [[id number]]    Follow-up: Return in about 6 months (around 12/31/2022) for f/u blood pressure.    Eugenia Pancoast, FNP

## 2022-07-01 NOTE — Progress Notes (Signed)
Labs unremarkable.

## 2022-07-01 NOTE — Patient Instructions (Addendum)
Call cardiologist office Dr. Rockey Situ, to schedule one year f/u. This will be due on April 2023.   Decrease furosemide to 20 mg once daily.   Stop by the lab prior to leaving today. I will notify you of your results once received.    Regards,   Eugenia Pancoast FNP-C

## 2022-07-03 LAB — ANTI-NUCLEAR AB-TITER (ANA TITER): ANA Titer 1: 1:40 {titer} — ABNORMAL HIGH

## 2022-07-03 LAB — ANA: Anti Nuclear Antibody (ANA): POSITIVE — AB

## 2022-07-05 ENCOUNTER — Telehealth: Payer: Self-pay | Admitting: Family

## 2022-07-05 DIAGNOSIS — B351 Tinea unguium: Secondary | ICD-10-CM | POA: Insufficient documentation

## 2022-07-05 DIAGNOSIS — M2550 Pain in unspecified joint: Secondary | ICD-10-CM | POA: Insufficient documentation

## 2022-07-05 NOTE — Assessment & Plan Note (Signed)
Antiinflammatory panel ordered pending results

## 2022-07-05 NOTE — Assessment & Plan Note (Signed)
rx penlac solution

## 2022-07-05 NOTE — Telephone Encounter (Signed)
LVM for patient Left message for patient to schedule Annual Wellness Visit.  Please schedule with Nurse Health Advisor Charlott Rakes, RN This appt can be telephone or office visit. Please call [phone] ask for [facility]

## 2022-07-05 NOTE — Assessment & Plan Note (Addendum)
Continue metoprolol 25 mg and lisinopril 5 mg once daily

## 2022-07-05 NOTE — Progress Notes (Signed)
Very low positive ANA this is typically a false positive in a 71 y/o patient.  We will continue to monitor.

## 2022-07-19 ENCOUNTER — Telehealth: Payer: Self-pay | Admitting: Family

## 2022-07-19 NOTE — Telephone Encounter (Signed)
Called to schedule AWV paitent mailbox full

## 2022-08-11 DIAGNOSIS — M2012 Hallux valgus (acquired), left foot: Secondary | ICD-10-CM | POA: Diagnosis not present

## 2022-08-11 DIAGNOSIS — M2041 Other hammer toe(s) (acquired), right foot: Secondary | ICD-10-CM | POA: Diagnosis not present

## 2022-08-11 DIAGNOSIS — M79674 Pain in right toe(s): Secondary | ICD-10-CM | POA: Diagnosis not present

## 2022-08-11 DIAGNOSIS — M2042 Other hammer toe(s) (acquired), left foot: Secondary | ICD-10-CM | POA: Diagnosis not present

## 2022-08-11 DIAGNOSIS — B351 Tinea unguium: Secondary | ICD-10-CM | POA: Diagnosis not present

## 2022-08-11 DIAGNOSIS — M2011 Hallux valgus (acquired), right foot: Secondary | ICD-10-CM | POA: Diagnosis not present

## 2022-08-11 DIAGNOSIS — M79675 Pain in left toe(s): Secondary | ICD-10-CM | POA: Diagnosis not present

## 2022-08-11 DIAGNOSIS — L600 Ingrowing nail: Secondary | ICD-10-CM | POA: Diagnosis not present

## 2022-10-27 ENCOUNTER — Ambulatory Visit
Admission: EM | Admit: 2022-10-27 | Discharge: 2022-10-27 | Disposition: A | Discharge status: Home / Self Care | Payer: Medicare HMO | Attending: Urgent Care | Admitting: Urgent Care

## 2022-10-27 DIAGNOSIS — R1033 Periumbilical pain: Secondary | ICD-10-CM | POA: Insufficient documentation

## 2022-10-27 DIAGNOSIS — R519 Headache, unspecified: Secondary | ICD-10-CM | POA: Diagnosis not present

## 2022-10-27 DIAGNOSIS — Z1152 Encounter for screening for COVID-19: Secondary | ICD-10-CM | POA: Insufficient documentation

## 2022-10-27 DIAGNOSIS — R109 Unspecified abdominal pain: Secondary | ICD-10-CM | POA: Diagnosis not present

## 2022-10-27 LAB — POCT URINALYSIS DIP (MANUAL ENTRY)
Bilirubin, UA: NEGATIVE
Glucose, UA: NEGATIVE mg/dL
Ketones, POC UA: NEGATIVE mg/dL
Leukocytes, UA: NEGATIVE
Nitrite, UA: NEGATIVE
Protein Ur, POC: NEGATIVE mg/dL
Spec Grav, UA: 1.02 (ref 1.010–1.025)
Urobilinogen, UA: 1 E.U./dL
pH, UA: 7 (ref 5.0–8.0)

## 2022-10-27 NOTE — Discharge Instructions (Addendum)
Please go to the ED for evaluation if your symptoms worsen.

## 2022-10-27 NOTE — ED Triage Notes (Signed)
Pt is with her daughter  Pt c/o headache, dizziness, abdominal pain x1day  Pt states that the abdominal pain is along the middle.   Pt denies congestion or persistant cough

## 2022-10-27 NOTE — ED Provider Notes (Signed)
[name]    CSN: [id number] Arrival date & time: 10/27/22  1823      History   Chief Complaint Chief Complaint  Patient presents with   Abdominal Pain   Headache    HPI Madison Miller is a 72 y.o. female.    Abdominal Pain Headache Associated symptoms: abdominal pain     Patient is accompanied by her daughter.  Presents with complaint of headache, dizziness, abdominal pain x 1 day.  Denies nasal congestion or cough. Denies fever.  Past Medical History:  Diagnosis Date   CHF (congestive heart failure) (HCC)    GERD (gastroesophageal reflux disease)    HTN (hypertension)    Hyperparathyroidism    Migraine, unspecified, with intractable migraine, so stated, without mention of status migrainosus    Right bundle branch block and left anterior fascicular block    SVT (supraventricular tachycardia)     Patient Active Problem List   Diagnosis Date Noted   Toenail fungus 07/05/2022   Polyarthralgia 07/05/2022   Toe pain, bilateral 02/16/2022   Stage 3a chronic kidney disease (CKD) ([place]) 02/01/2022   Osteopenia 11/27/2021   Mixed hyperlipidemia 11/16/2021   Nephrolithiasis 11/16/2021   Paroxysmal SVT (supraventricular tachycardia) 03/28/2021   Essential hypertension 03/27/2021   CHF (congestive heart failure) ([place]) 10/17/2017   ALLERGIC RHINITIS, SEASONAL 07/03/2008    Past Surgical History:  Procedure Laterality Date   LUMBAR LAMINECTOMY  2010   Dr. Luiz Ochoa   PARATHYROIDECTOMY  2009    OB History   No obstetric history on file.      Home Medications    Prior to Admission medications   Medication Sig Start Date End Date Taking? Authorizing Provider  acetaminophen (TYLENOL) 500 MG tablet Take 500 mg by mouth as needed for moderate pain or headache.    Yes [provider]  ciclopirox (PENLAC) 8 % solution Apply topically at bedtime. Apply over nail and surrounding skin. Apply daily over previous coat. After seven (7) days, may  remove with alcohol and continue cycle. 07/01/22  Yes Dugal, Lawerance Bach, FNP  furosemide (LASIX) 20 MG tablet Take 1 tablet (20 mg total) by mouth daily. 07/01/22 12/28/22 Yes Dugal, Tabitha, FNP  lisinopril (ZESTRIL) 5 MG tablet Take 1 tablet (5 mg total) by mouth daily. 07/01/22  Yes Dugal, Lawerance Bach, FNP  metoprolol succinate (TOPROL-XL) 25 MG 24 hr tablet Take 0.5 tablets (12.5 mg total) by mouth daily. 07/01/22  Yes Dugal, Lawerance Bach, FNP  potassium chloride (KLOR-CON) 10 MEQ tablet Take 1 tablet (10 mEq total) by mouth daily. 12/28/21 04/16/22  Minna Merritts, MD    Family History Family History  Problem Relation Age of Onset   Other Mother        barin tumor   Stroke Father    Colon cancer Neg Hx    Rectal cancer Neg Hx    Stomach cancer Neg Hx     Social History Social History   Tobacco Use   Smoking status: Never   Smokeless tobacco: Never  Vaping Use   Vaping Use: Never used  Substance Use Topics   Alcohol use: No    Alcohol/week: 0.0 standard drinks of alcohol   Drug use: No     Allergies   Lactose and Lactose intolerance (gi)   Review of Systems Review of Systems  Gastrointestinal:  Positive for abdominal pain.  Neurological:  Positive for headaches.     Physical Exam Triage Vital Signs ED Triage Vitals  Enc Vitals Group  BP 10/27/22 1911 (!) 163/90     Pulse Rate 10/27/22 1911 66     Resp --      Temp 10/27/22 1911 98.6 F (37 C)     Temp Source 10/27/22 1911 Oral     SpO2 10/27/22 1911 99 %     Weight --      Height --      Head Circumference --      Peak Flow --      Pain Score 10/27/22 1909 7     Pain Loc --      Pain Edu? --      Excl. in [place]? --    No data found.  Updated Vital Signs BP (!) 163/90 (BP Location: Left Arm)   Pulse 66   Temp 98.6 F (37 C) (Oral)   SpO2 99%   Visual Acuity Right Eye Distance:   Left Eye Distance:   Bilateral Distance:    Right Eye Near:   Left Eye Near:    Bilateral Near:     Physical  Exam Vitals reviewed.  Constitutional:      Appearance: She is well-developed.  Skin:    General: Skin is warm and dry.  Neurological:     General: No focal deficit present.     Mental Status: She is alert.  Psychiatric:        Mood and Affect: Mood normal.        Behavior: Behavior normal.      UC Treatments / Results  Labs (all labs ordered are listed, but only abnormal results are displayed) Labs Reviewed  POCT URINALYSIS DIP (MANUAL ENTRY) - Abnormal; Notable for the following components:      Result Value   Blood, UA large (*)    All other components within normal limits    EKG   Radiology No results found.  Procedures Procedures (including critical care time)  Medications Ordered in UC Medications - No data to display  Initial Impression / Assessment and Plan / UC Course  I have reviewed the triage vital signs and the nursing notes.  Pertinent labs & imaging results that were available during my care of the patient were reviewed by me and considered in my medical decision making (see chart for details).   Patient is afebrile here without recent antipyretics. Satting well on room air. Overall is ill appearing, though well hydrated, without respiratory distress. Pulmonary exam is unremarkable.  Lungs CTAB without wheezing, rhonchi, rales. There is periumbilical tenderness and guarding. UA is not suggestive of UTI, however there is large blood present.  Wide differential for her symptoms including an acute viral process. Will swab for covid and advise patient to go to ED for evaluation if her symptoms worsen.  Final Clinical Impressions(s) / UC Diagnoses   Final diagnoses:  None   Discharge Instructions   None    ED Prescriptions   None    PDMP not reviewed this encounter.   Rose Phi,  10/27/22 [phone]

## 2022-10-28 ENCOUNTER — Encounter: Payer: Self-pay | Admitting: Family

## 2022-10-28 ENCOUNTER — Ambulatory Visit (INDEPENDENT_AMBULATORY_CARE_PROVIDER_SITE_OTHER): Payer: Medicare HMO | Admitting: Family

## 2022-10-28 VITALS — BP 130/88 | HR 61 | Temp 97.4°F | Ht 63.0 in | Wt 116.4 lb

## 2022-10-28 DIAGNOSIS — R3129 Other microscopic hematuria: Secondary | ICD-10-CM | POA: Insufficient documentation

## 2022-10-28 DIAGNOSIS — R311 Benign essential microscopic hematuria: Secondary | ICD-10-CM

## 2022-10-28 DIAGNOSIS — M25561 Pain in right knee: Secondary | ICD-10-CM

## 2022-10-28 DIAGNOSIS — R1030 Lower abdominal pain, unspecified: Secondary | ICD-10-CM

## 2022-10-28 DIAGNOSIS — R519 Headache, unspecified: Secondary | ICD-10-CM | POA: Diagnosis not present

## 2022-10-28 DIAGNOSIS — J020 Streptococcal pharyngitis: Secondary | ICD-10-CM | POA: Diagnosis not present

## 2022-10-28 DIAGNOSIS — R6883 Chills (without fever): Secondary | ICD-10-CM

## 2022-10-28 LAB — POC URINALSYSI DIPSTICK (AUTOMATED)
Bilirubin, UA: NEGATIVE
Glucose, UA: NEGATIVE
Ketones, UA: NEGATIVE
Leukocytes, UA: NEGATIVE
Nitrite, UA: NEGATIVE
Protein, UA: NEGATIVE
Spec Grav, UA: 1.015 (ref 1.010–1.025)
Urobilinogen, UA: 0.2 E.U./dL
pH, UA: 6.5 (ref 5.0–8.0)

## 2022-10-28 LAB — SARS CORONAVIRUS 2 (TAT 6-24 HRS): SARS Coronavirus 2: NEGATIVE

## 2022-10-28 LAB — POCT INFLUENZA A/B
Influenza A, POC: NEGATIVE
Influenza B, POC: NEGATIVE

## 2022-10-28 LAB — POCT RAPID STREP A (OFFICE): Rapid Strep A Screen: POSITIVE — AB

## 2022-10-28 MED ORDER — AMOXICILLIN 875 MG PO TABS: 875.0000 mg | ORAL_TABLET | Freq: Two times a day (BID) | ORAL | 0 refills | Status: AC

## 2022-10-28 MED ORDER — AMOXICILLIN-POT CLAVULANATE 875-125 MG PO TABS: 1.0000 | ORAL_TABLET | Freq: Two times a day (BID) | ORAL | 0 refills | Status: DC

## 2022-10-28 NOTE — Progress Notes (Signed)
Strep positive, d/w pt in office and treated.

## 2022-10-28 NOTE — Addendum Note (Signed)
Addended by: Ellamae Sia on: 10/28/2022 03:56 PM   Modules accepted: Orders

## 2022-10-28 NOTE — Assessment & Plan Note (Signed)
Suspect result of strep symptoms however advised pt to check blood pressure at home over the next few days to r/o elevated blood pressure

## 2022-10-28 NOTE — Progress Notes (Signed)
Established Patient Office Visit  Subjective:   Patient ID: Madison Miller, female    DOB: June 06, 1951  Age: 72 y.o. MRN: [id number]  CC:  Chief Complaint  Patient presents with   Hospitalization Follow-up    Seen on 10/27/22 at [facility] ER, dx acute nonintractable HA; periumbilical abd pain.     HPI: DECLYNN LOPRESTI is a 72 y.o. female presenting on 10/28/2022 for Hospitalization Follow-up (Seen on 10/27/22 at [facility] ER, dx acute nonintractable HA; periumbilical abd pain. )    HPI  Accompanied today by daughter.   Yesterday was seen in the urgent care and there was some blood in her urine, and she was found to be covid negative. They did not order urine culture however. She is noticing dysuria with peeing as well. No leukocytes in urine. She went to urgent care because was complaining of headache, dizziness and abdominal pain. The pain is umbilical and lower pelvic area.  Blood pressure was elevated in urgent care as well as also elevated today.  More weak than normal as well.   C/o headache today as well, constant. No blurry vision. On right side frontal and temple.  She did throw up yesterday, not today. No diarrhea.   Denies peripheral numbness tingling   No fever.   She states no increased confusion or memory concerns.       ROS: Negative unless specifically indicated above in HPI.   Relevant past medical history reviewed and updated as indicated.   Allergies and medications reviewed and updated.   Current Outpatient Medications:    acetaminophen (TYLENOL) 500 MG tablet, Take 500 mg by mouth as needed for moderate pain or headache. , Disp: , Rfl:    amoxicillin (AMOXIL) 875 MG tablet, Take 1 tablet (875 mg total) by mouth 2 (two) times daily for 10 days., Disp: 20 tablet, Rfl: 0   ciclopirox (PENLAC) 8 % solution, Apply topically at bedtime. Apply over nail and surrounding skin. Apply daily over previous coat. After seven (7) days, may remove with alcohol and  continue cycle., Disp: 6.6 mL, Rfl: 0   furosemide (LASIX) 20 MG tablet, Take 1 tablet (20 mg total) by mouth daily., Disp: 90 tablet, Rfl: 1   lisinopril (ZESTRIL) 5 MG tablet, Take 1 tablet (5 mg total) by mouth daily., Disp: 90 tablet, Rfl: 3   metoprolol succinate (TOPROL-XL) 25 MG 24 hr tablet, Take 0.5 tablets (12.5 mg total) by mouth daily., Disp: 90 tablet, Rfl: 3   potassium chloride (KLOR-CON) 10 MEQ tablet, Take 1 tablet (10 mEq total) by mouth daily., Disp: 90 tablet, Rfl: 3  Allergies  Allergen Reactions   Lactose Other (See Comments)    Pain,bloating   Lactose Intolerance (Gi) Other (See Comments)    Pain,bloating    Objective:   BP 130/88   Pulse 61   Temp (!) 97.4 F (36.3 C) (Temporal)   Ht 5\' 3"  (1.6 m)   Wt 116 lb 6 oz (52.8 kg)   SpO2 98%   BMI 20.61 kg/m    Physical Exam Constitutional:      General: She is not in acute distress.    Appearance: Normal appearance. She is normal weight. She is ill-appearing (shaking on exam stating she is cold). She is not toxic-appearing or diaphoretic.  HENT:     Head: Normocephalic.     Right Ear: Hearing, tympanic membrane, ear canal and external ear normal.     Left Ear: Hearing, tympanic membrane, ear canal and  external ear normal.     Mouth/Throat:     Mouth: Mucous membranes are moist. No oral lesions.     Tongue: No lesions.     Pharynx: Oropharyngeal exudate and posterior oropharyngeal erythema present.  Cardiovascular:     Rate and Rhythm: Normal rate and regular rhythm.  Pulmonary:     Effort: Pulmonary effort is normal.  Abdominal:     General: Abdomen is flat. There is no distension.     Palpations: There is no mass.     Tenderness: There is generalized abdominal tenderness and tenderness in the right upper quadrant, right lower quadrant, left upper quadrant and left lower quadrant. There is no right CVA tenderness, left CVA tenderness or guarding.     Hernia: No hernia is present.     Comments: Moderate  pain in all quadrants   Musculoskeletal:        General: Normal range of motion.  Neurological:     General: No focal deficit present.     Mental Status: She is alert and oriented to person, place, and time. Mental status is at baseline.     Cranial Nerves: Cranial nerves 2-12 are intact. No cranial nerve deficit or facial asymmetry.     Sensory: Sensation is intact.     Motor: Motor function is intact.     Coordination: Coordination is intact.     Gait: Gait abnormal (limping on right side stating right leg pain).  Psychiatric:        Mood and Affect: Mood normal.        Behavior: Behavior normal.        Thought Content: Thought content normal.        Judgment: Judgment normal.     Assessment & Plan:  Benign essential microscopic hematuria Assessment & Plan: New. Will repeat in two weeks to verify resolution, sending urine culture to r/o other cause.  Advised pt also to increase water intake.   Orders: -     POCT Urinalysis Dipstick (Automated) -     Urine Culture -     Urinalysis, Routine w reflex microscopic  Lower abdominal pain  Acute nonintractable headache, unspecified headache type Assessment & Plan: Suspect result of strep symptoms however advised pt to check blood pressure at home over the next few days to r/o elevated blood pressure    Chills -     POCT Influenza A/B -     POCT rapid strep A  Strep pharyngitis Assessment & Plan: Strep tested positive in office.  rx for amox 875 mg po bid x 10 days.  Ibuprofen/tyelnol prn sore throat/fever Pt told to F/u if no improvement in the next 2-3 days.   Orders: -     Amoxicillin; Take 1 tablet (875 mg total) by mouth 2 (two) times daily for 10 days.  Dispense: 20 tablet; Refill: 0  Acute pain of right knee Assessment & Plan: Possible myositis from strep  Warmth to site. Tylenol prn    .   Follow up plan: Return in about 2 weeks (around 11/11/2022) for f/u blood pressure.  Eugenia Pancoast, FNP

## 2022-10-28 NOTE — Patient Instructions (Signed)
  Stop by the lab prior to leaving today. I will notify you of your results once received.    Regards,   Siona Coulston FNP-C  

## 2022-10-28 NOTE — Assessment & Plan Note (Signed)
New. Will repeat in two weeks to verify resolution, sending urine culture to r/o other cause.  Advised pt also to increase water intake.

## 2022-10-28 NOTE — Assessment & Plan Note (Signed)
Strep tested positive in office.  rx for amox 875 mg po bid x 10 days.  Ibuprofen/tyelnol prn sore throat/fever Pt told to F/u if no improvement in the next 2-3 days.  

## 2022-10-28 NOTE — Assessment & Plan Note (Signed)
Possible myositis from strep  Warmth to site. Tylenol prn

## 2022-10-29 ENCOUNTER — Telehealth: Payer: Self-pay | Admitting: Family

## 2022-10-29 LAB — URINE CULTURE
MICRO NUMBER:: 14538969
SPECIMEN QUALITY:: ADEQUATE

## 2022-10-29 NOTE — Telephone Encounter (Signed)
We have to have 2 identifiers right?

## 2022-10-29 NOTE — Telephone Encounter (Signed)
Patient called in and was wanting a new note to be printed out for work without her birthday on it. She would like to come by and pick it up. She would like a call when its ready. Thank you!

## 2022-10-29 NOTE — Telephone Encounter (Signed)
Patient called back in regarding this letter ,and want to make sure that her birthday will not be put on it.

## 2022-10-29 NOTE — Telephone Encounter (Signed)
I believe that we do. Patient can mark it off herself with a black marker. We need the two identifiers.

## 2022-11-01 NOTE — Telephone Encounter (Signed)
Patient called in to follow up on this request. Relayed message below regarding marking it out herself.

## 2022-11-01 NOTE — Progress Notes (Signed)
Negative for UTI.  How did patient do over the weekend, improvement in symptoms with the antibiotic?

## 2022-11-02 ENCOUNTER — Telehealth: Payer: Self-pay | Admitting: Family

## 2022-11-02 NOTE — Telephone Encounter (Signed)
Patient would like to know if her [facility] appointment on 11/11/2022 is suppose to be ain office or a phone call?

## 2022-11-10 ENCOUNTER — Telehealth: Payer: Self-pay | Admitting: Family

## 2022-11-10 ENCOUNTER — Ambulatory Visit: Payer: Medicare HMO | Admitting: Family

## 2022-11-10 NOTE — Telephone Encounter (Signed)
Contacted Madison Miller to schedule their annual wellness visit. Appointment made for 12/02/2022.  [place] Direct Dial: [phone]

## 2022-11-11 ENCOUNTER — Ambulatory Visit: Payer: Medicare HMO

## 2022-11-30 ENCOUNTER — Telehealth: Payer: Self-pay | Admitting: Family

## 2022-11-30 NOTE — Telephone Encounter (Signed)
Called patient to schedule Medicare Annual Wellness Visit (AWV). Left message for patient to call back and schedule Medicare Annual Wellness Visit (AWV).  Change appt from a in-office to a phone AWV: 11/30/2022  Please schedule an appointment at any time with [facility].  If any questions, please contact me at [phone].  Thank you ,  [place] Direct Dial: [phone]

## 2022-12-01 NOTE — Telephone Encounter (Signed)
Patient called in returning call she received. Informed her that appointment tomorrow is in-office but needs to be changed to telephone. Patient stated that she would like to keep her appointment in office. She stated that she will like a phone call back. Thank you!

## 2022-12-02 ENCOUNTER — Ambulatory Visit: Payer: Medicare HMO

## 2022-12-02 NOTE — Telephone Encounter (Signed)
Patient came in for appointment after being advised that she had to reschedule or change to telephone appointment. She was advised that she would receive a phone call to reschedule.

## 2022-12-08 ENCOUNTER — Telehealth: Payer: Self-pay | Admitting: Family

## 2022-12-08 NOTE — Telephone Encounter (Signed)
Contacted Madison Miller to schedule their annual wellness visit. Appointment made for 12/15/2022.  [place] Direct Dial: [phone]

## 2022-12-08 NOTE — Telephone Encounter (Signed)
Appt scheduled for 12/15/2022

## 2022-12-15 ENCOUNTER — Ambulatory Visit (INDEPENDENT_AMBULATORY_CARE_PROVIDER_SITE_OTHER): Payer: Medicare HMO

## 2022-12-15 VITALS — Ht 63.0 in | Wt 120.0 lb

## 2022-12-15 DIAGNOSIS — Z0000 Encounter for general adult medical examination without abnormal findings: Secondary | ICD-10-CM

## 2022-12-15 NOTE — Patient Instructions (Signed)
[name] , Thank you for taking time to come for your Medicare Wellness Visit. I appreciate your ongoing commitment to your health goals. Please review the following plan we discussed and let me know if I can assist you in the future.   These are the goals we discussed:  Goals      Increase physical activity     Starting 03/08/2017, I attempt to ride my stationary bike for at least 30 min 2 days per week.     Patient Stated     No new goals.        This is a list of the screening recommended for you and due dates:  Health Maintenance  Topic Date Due   DTaP/Tdap/Td vaccine (1 - Tdap) Never done   Colon Cancer Screening  05/05/2020   Flu Shot  12/19/2022*   Mammogram  11/19/2023*   Hepatitis C Screening: USPSTF Recommendation to screen - Ages 18-79 yo.  11/19/2023*   Zoster (Shingles) Vaccine (1 of 2) 03/28/2026*   Medicare Annual Wellness Visit  12/15/2023   DEXA scan (bone density measurement)  Completed   HPV Vaccine  Aged Out   Pneumonia Vaccine  Discontinued   COVID-19 Vaccine  Discontinued  *Topic was postponed. The date shown is not the original due date.    Advanced directives: Advance directive discussed with you today. Even though you declined this today, please call our office should you change your mind, and we can give you the proper paperwork for you to fill out.   Conditions/risks identified: Aim for 30 minutes of exercise or brisk walking, 6-8 glasses of water, and 5 servings of fruits and vegetables each day.   Next appointment: Follow up in one year for your annual wellness visit 12/21/23 @ 11:30 in person.   Preventive Care 19 Years and Older, Female Preventive care refers to lifestyle choices and visits with your health care provider that can promote health and wellness. What does preventive care include? A yearly physical exam. This is also called an annual well check. Dental exams once or twice a year. Routine eye exams. Ask your health care  provider how often you should have your eyes checked. Personal lifestyle choices, including: Daily care of your teeth and gums. Regular physical activity. Eating a healthy diet. Avoiding tobacco and drug use. Limiting alcohol use. Practicing safe sex. Taking low-dose aspirin every day. Taking vitamin and mineral supplements as recommended by your health care provider. What happens during an annual well check? The services and screenings done by your health care provider during your annual well check will depend on your age, overall health, lifestyle risk factors, and family history of disease. Counseling  Your health care provider may ask you questions about your: Alcohol use. Tobacco use. Drug use. Emotional well-being. Home and relationship well-being. Sexual activity. Eating habits. History of falls. Memory and ability to understand (cognition). Work and work Statistician. Reproductive health. Screening  You may have the following tests or measurements: Height, weight, and BMI. Blood pressure. Lipid and cholesterol levels. These may be checked every 5 years, or more frequently if you are over 12 years old. Skin check. Lung cancer screening. You may have this screening every year starting at age 24 if you have a 30-pack-year history of smoking and currently smoke or have quit within the past 15 years. Fecal occult blood test (FOBT) of the stool. You may have this test every year starting at age 40. Flexible sigmoidoscopy or colonoscopy. You may have  a sigmoidoscopy every 5 years or a colonoscopy every 10 years starting at age 88. Hepatitis C blood test. Hepatitis B blood test. Sexually transmitted disease (STD) testing. Diabetes screening. This is done by checking your blood sugar (glucose) after you have not eaten for a while (fasting). You may have this done every 1-3 years. Bone density scan. This is done to screen for osteoporosis. You may have this done starting at age  7. Mammogram. This may be done every 1-2 years. Talk to your health care provider about how often you should have regular mammograms. Talk with your health care provider about your test results, treatment options, and if necessary, the need for more tests. Vaccines  Your health care provider may recommend certain vaccines, such as: Influenza vaccine. This is recommended every year. Tetanus, diphtheria, and acellular pertussis (Tdap, Td) vaccine. You may need a Td booster every 10 years. Zoster vaccine. You may need this after age 43. Pneumococcal 13-valent conjugate (PCV13) vaccine. One dose is recommended after age 34. Pneumococcal polysaccharide (PPSV23) vaccine. One dose is recommended after age 76. Talk to your health care provider about which screenings and vaccines you need and how often you need them. This information is not intended to replace advice given to you by your health care provider. Make sure you discuss any questions you have with your health care provider. Document Released: 10/03/2015 Document Revised: 05/26/2016 Document Reviewed: 07/08/2015 Elsevier Interactive Patient Education  2017 [place] Prevention in the Home Falls can cause injuries. They can happen to people of all ages. There are many things you can do to make your home safe and to help prevent falls. What can I do on the outside of my home? Regularly fix the edges of walkways and driveways and fix any cracks. Remove anything that might make you trip as you walk through a door, such as a raised step or threshold. Trim any bushes or trees on the path to your home. Use bright outdoor lighting. Clear any walking paths of anything that might make someone trip, such as rocks or tools. Regularly check to see if handrails are loose or broken. Make sure that both sides of any steps have handrails. Any raised decks and porches should have guardrails on the edges. Have any leaves, snow, or ice cleared  regularly. Use sand or salt on walking paths during winter. Clean up any spills in your garage right away. This includes oil or grease spills. What can I do in the bathroom? Use night lights. Install grab bars by the toilet and in the tub and shower. Do not use towel bars as grab bars. Use non-skid mats or decals in the tub or shower. If you need to sit down in the shower, use a plastic, non-slip stool. Keep the floor dry. Clean up any water that spills on the floor as soon as it happens. Remove soap buildup in the tub or shower regularly. Attach bath mats securely with double-sided non-slip rug tape. Do not have throw rugs and other things on the floor that can make you trip. What can I do in the bedroom? Use night lights. Make sure that you have a light by your bed that is easy to reach. Do not use any sheets or blankets that are too big for your bed. They should not hang down onto the floor. Have a firm chair that has side arms. You can use this for support while you get dressed. Do not have throw rugs and  other things on the floor that can make you trip. What can I do in the kitchen? Clean up any spills right away. Avoid walking on wet floors. Keep items that you use a lot in easy-to-reach places. If you need to reach something above you, use a strong step stool that has a grab bar. Keep electrical cords out of the way. Do not use floor polish or wax that makes floors slippery. If you must use wax, use non-skid floor wax. Do not have throw rugs and other things on the floor that can make you trip. What can I do with my stairs? Do not leave any items on the stairs. Make sure that there are handrails on both sides of the stairs and use them. Fix handrails that are broken or loose. Make sure that handrails are as long as the stairways. Check any carpeting to make sure that it is firmly attached to the stairs. Fix any carpet that is loose or worn. Avoid having throw rugs at the top or  bottom of the stairs. If you do have throw rugs, attach them to the floor with carpet tape. Make sure that you have a light switch at the top of the stairs and the bottom of the stairs. If you do not have them, ask someone to add them for you. What else can I do to help prevent falls? Wear shoes that: Do not have high heels. Have rubber bottoms. Are comfortable and fit you well. Are closed at the toe. Do not wear sandals. If you use a stepladder: Make sure that it is fully opened. Do not climb a closed stepladder. Make sure that both sides of the stepladder are locked into place. Ask someone to hold it for you, if possible. Clearly mark and make sure that you can see: Any grab bars or handrails. First and last steps. Where the edge of each step is. Use tools that help you move around (mobility aids) if they are needed. These include: Canes. Walkers. Scooters. Crutches. Turn on the lights when you go into a dark area. Replace any light bulbs as soon as they burn out. Set up your furniture so you have a clear path. Avoid moving your furniture around. If any of your floors are uneven, fix them. If there are any pets around you, be aware of where they are. Review your medicines with your doctor. Some medicines can make you feel dizzy. This can increase your chance of falling. Ask your doctor what other things that you can do to help prevent falls. This information is not intended to replace advice given to you by your health care provider. Make sure you discuss any questions you have with your health care provider. Document Released: 07/03/2009 Document Revised: 02/12/2016 Document Reviewed: 10/11/2014 Elsevier Interactive Patient Education  2017 [employer].

## 2022-12-15 NOTE — Progress Notes (Signed)
I connected with  Madison Miller on 12/15/22 by a audio enabled telemedicine application and verified that I am speaking with the correct person using two identifiers.  Patient Location: Home  Provider Location: Office/Clinic  I discussed the limitations of evaluation and management by telemedicine. The patient expressed understanding and agreed to proceed.  Subjective:   Madison Miller is a 72 y.o. female who presents for Medicare Annual (Subsequent) preventive examination.  Review of Systems      Cardiac Risk Factors include: advanced age (>72men, >46 women);hypertension;sedentary lifestyle     Objective:    Today's Vitals   12/15/22 1146  Weight: 120 lb (54.4 kg)  Height: 5\' 3"  (1.6 m)   Body mass index is 21.26 kg/m.     12/15/2022   11:52 AM 10/27/2022    7:11 PM 03/15/2020   12:09 AM 07/27/2019    6:24 PM 06/17/2017   11:05 PM 03/08/2017   10:46 AM 08/21/2016   11:55 PM  Advanced Directives  Does Patient Have a Medical Advance Directive? No No No No No No No  Would patient like information on creating a medical advance directive? No - Patient declined  No - Patient declined No - Patient declined       Current Medications (verified) Outpatient Encounter Medications as of 12/15/2022  Medication Sig   acetaminophen (TYLENOL) 500 MG tablet Take 500 mg by mouth as needed for moderate pain or headache.    ciclopirox (PENLAC) 8 % solution Apply topically at bedtime. Apply over nail and surrounding skin. Apply daily over previous coat. After seven (7) days, may remove with alcohol and continue cycle.   furosemide (LASIX) 20 MG tablet Take 1 tablet (20 mg total) by mouth daily.   lisinopril (ZESTRIL) 5 MG tablet Take 1 tablet (5 mg total) by mouth daily.   metoprolol succinate (TOPROL-XL) 25 MG 24 hr tablet Take 0.5 tablets (12.5 mg total) by mouth daily.   potassium chloride (KLOR-CON) 10 MEQ tablet Take 1 tablet (10 mEq total) by mouth daily.   No  facility-administered encounter medications on file as of 12/15/2022.    Allergies (verified) Lactose and Lactose intolerance (gi)   History: Past Medical History:  Diagnosis Date   CHF (congestive heart failure) (HCC)    GERD (gastroesophageal reflux disease)    HTN (hypertension)    Hyperparathyroidism    Migraine, unspecified, with intractable migraine, so stated, without mention of status migrainosus    Right bundle branch block and left anterior fascicular block    SVT (supraventricular tachycardia)    Past Surgical History:  Procedure Laterality Date   LUMBAR LAMINECTOMY  2010   Dr. Luiz Ochoa   PARATHYROIDECTOMY  2009   Family History  Problem Relation Age of Onset   Other Mother        barin tumor   Stroke Father    Colon cancer Neg Hx    Rectal cancer Neg Hx    Stomach cancer Neg Hx    Social History   Socioeconomic History   Marital status: Widowed    Spouse name: Not on file   Number of children: 6   Years of education: Not on file   Highest education level: Not on file  Occupational History   Not on file  Tobacco Use   Smoking status: Never   Smokeless tobacco: Never  Vaping Use   Vaping Use: Never used  Substance and Sexual Activity   Alcohol use: No    Alcohol/week: 0.0 standard  drinks of alcohol   Drug use: No   Sexual activity: Never  Other Topics Concern   Not on file  Social History Narrative   Not on file   Social Determinants of Health   Financial Resource Strain: Low Risk  (12/15/2022)   Overall Financial Resource Strain (CARDIA)    Difficulty of Paying Living Expenses: Not hard at all  Food Insecurity: No Food Insecurity (12/15/2022)   Hunger Vital Sign    Worried About Running Out of Food in the Last Year: Never true    Ran Out of Food in the Last Year: Never true  Transportation Needs: No Transportation Needs (12/15/2022)   PRAPARE - Hydrologist (Medical): No    Lack of Transportation (Non-Medical): No   Physical Activity: Unknown (12/15/2022)   Exercise Vital Sign    Days of Exercise per Week: 3 days    Minutes of Exercise per Session: Not on file  Stress: No Stress Concern Present (12/15/2022)   [place]    Feeling of Stress : Not at all  Social Connections: Moderately Isolated (12/15/2022)   Social Connection and Isolation Panel [NHANES]    Frequency of Communication with Friends and Family: More than three times a week    Frequency of Social Gatherings with Friends and Family: More than three times a week    Attends Religious Services: More than 4 times per year    Active Member of [employer] or Organizations: No    Attends Archivist Meetings: Never    Marital Status: Widowed    Tobacco Counseling Counseling given: Not Answered   Clinical Intake:  Pre-visit preparation completed: Yes  Pain : No/denies pain     Nutritional Risks: None Diabetes: No  How often do you need to have someone help you when you read instructions, pamphlets, or other written materials from your doctor or pharmacy?: 1 - Never  Diabetic? no  Interpreter Needed?: No  Information entered by :: C.Dominick Morella LPN   Activities of Daily Living    12/15/2022   11:53 AM  In your present state of health, do you have any difficulty performing the following activities:  Hearing? 0  Vision? 0  Difficulty concentrating or making decisions? 0  Walking or climbing stairs? 0  Dressing or bathing? 0  Doing errands, shopping? 0  Preparing Food and eating ? N  Using the Toilet? N  In the past six months, have you accidently leaked urine? N  Do you have problems with loss of bowel control? N  Managing your Medications? N  Managing your Finances? N  Housekeeping or managing your Housekeeping? N    Patient Care Team: Eugenia Pancoast, FNP as PCP - General (Family Medicine)  Indicate any recent Medical Services you may have received  from other than [facility] providers in the past year (date may be approximate).     Assessment:   This is a routine wellness examination for [place].  Hearing/Vision screen Hearing Screening - Comments:: No aids Vision Screening - Comments:: Glasses - [facility]  Dietary issues and exercise activities discussed: Current Exercise Habits: The patient does not participate in regular exercise at present, Exercise limited by: None identified   Goals Addressed             This Visit's Progress    Patient Stated       No new goals.       Depression Screen  12/15/2022   11:51 AM 10/28/2022   12:47 PM 03/27/2021   12:06 PM 03/20/2020   11:53 AM 02/28/2020   11:45 AM 02/27/2019   12:55 PM 10/17/2017   10:28 AM  PHQ 2/9 Scores  PHQ - 2 Score 0 0 0 0 0 0 0    Fall Risk    12/15/2022   11:53 AM 10/28/2022   12:47 PM 03/27/2021   12:06 PM 03/20/2020   11:53 AM 10/17/2017   10:28 AM  Fall Risk   Falls in the past year? 0 0 0 0 No  Number falls in past yr: 0      Injury with Fall? 0      Risk for fall due to : No Fall Risks      Follow up Falls prevention discussed;Falls evaluation completed  Falls evaluation completed      FALL RISK PREVENTION PERTAINING TO THE HOME:  Any stairs in or around the home? No  If so, are there any without handrails? Yes  Home free of loose throw rugs in walkways, pet beds, electrical cords, etc? Yes  Adequate lighting in your home to reduce risk of falls? Yes   ASSISTIVE DEVICES UTILIZED TO PREVENT FALLS:  Life alert? No  Use of a cane, walker or w/c? No  Grab bars in the bathroom? Yes  Shower chair or bench in shower? No  Elevated toilet seat or a handicapped toilet? No    Cognitive Function:    03/08/2017   10:46 AM 12/10/2015   11:50 AM  MMSE - Mini Mental State Exam  Orientation to time 5 5  Orientation to Place 5 5  Registration 3 3  Attention/ Calculation 0 0  Recall 3 3  Language- name 2 objects 0 0  Language- repeat 1 1  Language-  follow 3 step command 3 3  Language- read & follow direction 0 0  Write a sentence 0 0  Copy design 0 0  Total score 20 20        12/15/2022   11:53 AM  6CIT Screen  What Year? 0 points  What month? 0 points  What time? 0 points  Count back from 20 0 points  Months in reverse 0 points  Repeat phrase 0 points  Total Score 0 points    Immunizations  There is no immunization history on file for this patient.  TDAP status: Due, Education has been provided regarding the importance of this vaccine. Advised may receive this vaccine at local pharmacy or Health Dept. Aware to provide a copy of the vaccination record if obtained from local pharmacy or Health Dept. Verbalized acceptance and understanding.  Flu Vaccine status: Declined, Education has been provided regarding the importance of this vaccine but patient still declined. Advised may receive this vaccine at local pharmacy or Health Dept. Aware to provide a copy of the vaccination record if obtained from local pharmacy or Health Dept. Verbalized acceptance and understanding.  Pneumococcal vaccine status: Declined,  Education has been provided regarding the importance of this vaccine but patient still declined. Advised may receive this vaccine at local pharmacy or Health Dept. Aware to provide a copy of the vaccination record if obtained from local pharmacy or Health Dept. Verbalized acceptance and understanding.   Covid-19 vaccine status: Declined, Education has been provided regarding the importance of this vaccine but patient still declined. Advised may receive this vaccine at local pharmacy or Health Dept.or vaccine clinic. Aware to provide a copy of the vaccination  record if obtained from local pharmacy or Health Dept. Verbalized acceptance and understanding.  Qualifies for Shingles Vaccine? Yes   Zostavax completed No   Shingrix Completed?: No.    Education has been provided regarding the importance of this vaccine. Patient has  been advised to call insurance company to determine out of pocket expense if they have not yet received this vaccine. Advised may also receive vaccine at local pharmacy or Health Dept. Verbalized acceptance and understanding.  Screening Tests Health Maintenance  Topic Date Due   DTaP/Tdap/Td (1 - Tdap) Never done   COLONOSCOPY (Pts 45-42yrs Insurance coverage will need to be confirmed)  05/05/2020   INFLUENZA VACCINE  12/19/2022 (Originally 04/20/2022)   MAMMOGRAM  11/19/2023 (Originally 09/15/2001)   Hepatitis C Screening  11/19/2023 (Originally 09/15/1969)   Zoster Vaccines- Shingrix (1 of 2) 03/28/2026 (Originally [birth year]1970)   Medicare Annual Wellness (AWV)  12/15/2023   DEXA SCAN  Completed   HPV VACCINES  Aged Out   Pneumonia Vaccine 88+ Years old  Discontinued   COVID-19 Vaccine  Discontinued    Health Maintenance  Health Maintenance Due  Topic Date Due   DTaP/Tdap/Td (1 - Tdap) Never done   COLONOSCOPY (Pts 45-32yrs Insurance coverage will need to be confirmed)  05/05/2020    Colorectal cancer screening: Type of screening: Colonoscopy. Completed 05/05/2010. Repeat every 10 years pt declined.  Mammogram - Pt declined.  Bone Scan - Pt declined.  Lung Cancer Screening: (Low Dose CT Chest recommended if Age 79-80 years, 30 pack-year currently smoking OR have quit w/in 15years.) does not qualify.   Lung Cancer Screening Referral: no  Additional Screening:  Hepatitis C Screening: does qualify; Completed due  Vision Screening: Recommended annual ophthalmology exams for early detection of glaucoma and other disorders of the eye. Is the patient up to date with their annual eye exam?  No  Who is the provider or what is the name of the office in which the patient attends annual eye exams? [facility] pt will call for appointment. If pt is not established with a provider, would they like to be referred to a provider to establish care? No .   Dental Screening: Recommended  annual dental exams for proper oral hygiene  Community Resource Referral / Chronic Care Management: CRR required this visit?  No   CCM required this visit?  No      Plan:     I have personally reviewed and noted the following in the patient's chart:   Medical and social history Use of alcohol, tobacco or illicit drugs  Current medications and supplements including opioid prescriptions. Patient is not currently taking opioid prescriptions. Functional ability and status Nutritional status Physical activity Advanced directives List of other physicians Hospitalizations, surgeries, and ER visits in previous 12 months Vitals Screenings to include cognitive, depression, and falls Referrals and appointments  In addition, I have reviewed and discussed with patient certain preventive protocols, quality metrics, and best practice recommendations. A written personalized care plan for preventive services as well as general preventive health recommendations were provided to patient.     Lebron Conners, LPN   [id number]   Nurse Notes: Pt declined mammogram, colonoscopy, and bone scan. Vaccinations: declines all Influenza vaccine: recommend every Fall Pneumococcal vaccine: recommend once per lifetime Prevnar-20 Tdap vaccine: recommend every 10 years Shingles vaccine: recommend Shingrix which is 2 doses 2-6 months apart and over 90% effective     Covid-19: recommend 2 doses one month apart with a booster  6 months later

## 2022-12-22 ENCOUNTER — Other Ambulatory Visit: Payer: Self-pay | Admitting: Cardiovascular Disease

## 2022-12-22 ENCOUNTER — Other Ambulatory Visit: Payer: Self-pay | Admitting: Family

## 2022-12-22 DIAGNOSIS — I509 Heart failure, unspecified: Secondary | ICD-10-CM

## 2022-12-22 NOTE — Telephone Encounter (Signed)
Patient called to follow up on refill request. Informed of time frame.

## 2022-12-23 ENCOUNTER — Encounter: Payer: Self-pay | Admitting: Family Medicine

## 2022-12-23 ENCOUNTER — Ambulatory Visit (INDEPENDENT_AMBULATORY_CARE_PROVIDER_SITE_OTHER)
Admission: RE | Admit: 2022-12-23 | Discharge: 2022-12-23 | Disposition: A | Discharge status: Home / Self Care | Payer: Medicare HMO | Source: Ambulatory Visit | Attending: Family Medicine | Admitting: Family Medicine

## 2022-12-23 ENCOUNTER — Ambulatory Visit (INDEPENDENT_AMBULATORY_CARE_PROVIDER_SITE_OTHER): Payer: Medicare HMO | Admitting: Family Medicine

## 2022-12-23 VITALS — BP 124/72 | HR 55 | Temp 97.6°F | Ht 63.0 in | Wt 113.4 lb

## 2022-12-23 DIAGNOSIS — R0789 Other chest pain: Secondary | ICD-10-CM

## 2022-12-23 DIAGNOSIS — R1013 Epigastric pain: Secondary | ICD-10-CM | POA: Insufficient documentation

## 2022-12-23 DIAGNOSIS — I509 Heart failure, unspecified: Secondary | ICD-10-CM

## 2022-12-23 DIAGNOSIS — R0781 Pleurodynia: Secondary | ICD-10-CM | POA: Diagnosis not present

## 2022-12-23 DIAGNOSIS — I10 Essential (primary) hypertension: Secondary | ICD-10-CM

## 2022-12-23 DIAGNOSIS — F4321 Adjustment disorder with depressed mood: Secondary | ICD-10-CM | POA: Diagnosis not present

## 2022-12-23 DIAGNOSIS — R69 Illness, unspecified: Secondary | ICD-10-CM | POA: Diagnosis not present

## 2022-12-23 DIAGNOSIS — I471 Supraventricular tachycardia, unspecified: Secondary | ICD-10-CM

## 2022-12-23 MED ORDER — FAMOTIDINE 20 MG PO TABS: 20.0000 mg | ORAL_TABLET | Freq: Two times a day (BID) | ORAL | 0 refills | Status: DC

## 2022-12-23 NOTE — Telephone Encounter (Signed)
Patient advised on medication being sent in.

## 2022-12-23 NOTE — Assessment & Plan Note (Signed)
bp in fair control at this time  BP Readings from Last 1 Encounters:  12/23/22 124/72   No changes needed Most recent labs reviewed  Disc lifstyle change with low sodium diet and exercise  bp in fair control at this time  BP Readings from Last 1 Encounters:  12/23/22 124/72   No changes needed Most recent labs reviewed  Disc lifstyle change with low sodium diet and exercise  Plan to continuue Lisinopril 5 mg daily and metoprolol xl 12.5 mg daily and lasix 20 mg daily

## 2022-12-23 NOTE — Assessment & Plan Note (Signed)
Recently lost daughter Lost SIL and husband both in the past 4 years Reviewed stressors/ coping techniques/symptoms/ support sources/ tx options and side effects in detail today  Offered ref for grief counseling - she may consider later  Thinks she is coping ok  Enc to remember self care

## 2022-12-23 NOTE — Assessment & Plan Note (Signed)
No recent re occurrence  On metoprolol Due for cardiology f/u

## 2022-12-23 NOTE — Assessment & Plan Note (Signed)
Soreness and tenderness -L anter ribs w/o trauma  Reassuring exam and vital signs  Disc poss of strain vs costochondritis  Cxr is clear of lung or rib changes   Inst to use warm compress Prn tylenol  Can try topical volteren gel as well  ER precautions noted Update if not starting to improve in a week or if worsening

## 2022-12-23 NOTE — Assessment & Plan Note (Signed)
Reviewed last cardiology note and echo Taking ace and also lasix 20 mg daily  No symptoms Due for cardiology f/u - referral done

## 2022-12-23 NOTE — Progress Notes (Signed)
Subjective:    Patient ID: Madison Miller, female    DOB: 1950-09-25, 72 y.o.   MRN: [id number]  HPI Pt presents for c/o chest soreness/tenderness Also grief- just lost her daugher   (lost SIL and husband in past 44 y as well)  Due for cardiology f/u Stomach hurts   72 yo pt of NP Dugal   Wt Readings from Last 3 Encounters:  12/23/22 113 lb 6 oz (51.4 kg)  12/15/22 120 lb (54.4 kg)  10/28/22 116 lb 6 oz (52.8 kg)   20.08 kg/m   Vitals:   12/23/22 0925  BP: 124/72  Pulse: (!) 55  Temp: 97.6 F (36.4 C)  SpO2: 100%   Never smoked    Some soreness in anterior chest  Little sore  Nothing new  No trauma    No episodes of SVT recently   Occ sob comes and goes  No leg swelling   Lost her daughter recently - natural causes but unknown cause (no post mortem exam)   Cxr: DG Ribs Unilateral W/Chest Left  Result Date: 12/23/2022 CLINICAL DATA:  Left anterior rib pain. EXAM: LEFT RIBS AND CHEST - 3+ VIEW COMPARISON:  Chest radiograph 03/15/2020 FINDINGS: The cardiomediastinal silhouette is normal. There is no focal consolidation or pulmonary edema. There is no pleural effusion or pneumothorax No displaced rib fracture or other acute osseous abnormality is identified. Lumbar spine fusion hardware is noted. There is a 1.4 cm left renal stone. IMPRESSION: 1. No displaced rib fracture or other acute osseous abnormality identified. 2. 1.4 cm left renal stone. Electronically Signed   By: Valetta Mole M.D.   On: 12/23/2022 10:34    Stomach bothers  Gurgling  Occ hurts  No heart burn  No diarrhea or constipation    HTN bp is stable today  No cp or palpitations or headaches or edema  No side effects to medicines  BP Readings from Last 3 Encounters:  12/23/22 124/72  10/28/22 130/88  10/27/22 (!) 163/90    Metoprolol 25 mg /2 daily Lisinopril 5 mg   H/o CHF and SVT Last echo 01/2022  Low nl LV fxn Grade 1 DD Lasitx 20 mg daily  Potassium   Patient Active  Problem List   Diagnosis Date Noted   Left-sided chest wall pain 12/23/2022   Grief reaction 12/23/2022   Dyspepsia 12/23/2022   Benign essential microscopic hematuria 10/28/2022   Acute pain of right knee 10/28/2022   Strep pharyngitis 10/28/2022   Acute nonintractable headache 10/28/2022   Toenail fungus 07/05/2022   Polyarthralgia 07/05/2022   Stage 3a chronic kidney disease (CKD) 02/01/2022   Osteopenia 11/27/2021   Mixed hyperlipidemia 11/16/2021   Nephrolithiasis 11/16/2021   Paroxysmal SVT (supraventricular tachycardia) 03/28/2021   Essential hypertension 03/27/2021   CHF (congestive heart failure) 10/17/2017   ALLERGIC RHINITIS, SEASONAL 07/03/2008   Past Medical History:  Diagnosis Date   CHF (congestive heart failure)    GERD (gastroesophageal reflux disease)    HTN (hypertension)    Hyperparathyroidism    Migraine, unspecified, with intractable migraine, so stated, without mention of status migrainosus    Right bundle branch block and left anterior fascicular block    SVT (supraventricular tachycardia)    Past Surgical History:  Procedure Laterality Date   LUMBAR LAMINECTOMY  2010   Dr. Luiz Ochoa   PARATHYROIDECTOMY  2009   Social History   Tobacco Use   Smoking status: Never   Smokeless tobacco: Never  Vaping Use  Vaping Use: Never used  Substance Use Topics   Alcohol use: No    Alcohol/week: 0.0 standard drinks of alcohol   Drug use: No   Family History  Problem Relation Age of Onset   Other Mother        barin tumor   Stroke Father    Colon cancer Neg Hx    Rectal cancer Neg Hx    Stomach cancer Neg Hx    Allergies  Allergen Reactions   Lactose Other (See Comments)    Pain,bloating   Lactose Intolerance (Gi) Other (See Comments)    Pain,bloating   Current Outpatient Medications on File Prior to Visit  Medication Sig Dispense Refill   acetaminophen (TYLENOL) 500 MG tablet Take 500 mg by mouth as needed for moderate pain or headache.       ciclopirox (PENLAC) 8 % solution Apply topically at bedtime. Apply over nail and surrounding skin. Apply daily over previous coat. After seven (7) days, may remove with alcohol and continue cycle. 6.6 mL 0   furosemide (LASIX) 20 MG tablet TAKE 1 TABLET BY MOUTH EVERY DAY 90 tablet 1   lisinopril (ZESTRIL) 5 MG tablet Take 1 tablet (5 mg total) by mouth daily. 90 tablet 3   metoprolol succinate (TOPROL-XL) 25 MG 24 hr tablet Take 0.5 tablets (12.5 mg total) by mouth daily. 90 tablet 3   potassium chloride (KLOR-CON) 10 MEQ tablet TAKE 1 TABLET BY MOUTH EVERY DAY 90 tablet 3   No current facility-administered medications on file prior to visit.    Review of Systems  Constitutional:  Negative for activity change, appetite change, fatigue, fever and unexpected weight change.  HENT:  Negative for congestion, ear pain, rhinorrhea, sinus pressure and sore throat.   Eyes:  Negative for pain, redness and visual disturbance.  Respiratory:  Negative for cough, shortness of breath and wheezing.   Cardiovascular:  Negative for chest pain, palpitations and leg swelling.       Chest wall pain   Gastrointestinal:  Positive for abdominal pain and nausea. Negative for abdominal distention, anal bleeding, blood in stool, constipation, diarrhea, rectal pain and vomiting.       Stomach is gurgly  A little queasy at times   Endocrine: Negative for polydipsia and polyuria.  Genitourinary:  Negative for dysuria, frequency and urgency.  Musculoskeletal:  Negative for arthralgias, back pain and myalgias.  Skin:  Negative for pallor and rash.  Allergic/Immunologic: Negative for environmental allergies.  Neurological:  Negative for dizziness, syncope and headaches.  Hematological:  Negative for adenopathy. Does not bruise/bleed easily.  Psychiatric/Behavioral:  Positive for dysphoric mood. Negative for decreased concentration. The patient is nervous/anxious.        Objective:   Physical Exam Constitutional:       General: She is not in acute distress.    Appearance: Normal appearance. She is well-developed. She is not ill-appearing or diaphoretic.     Comments: Slim   HENT:     Head: Normocephalic and atraumatic.  Eyes:     Conjunctiva/sclera: Conjunctivae normal.     Pupils: Pupils are equal, round, and reactive to light.  Neck:     Thyroid: No thyromegaly.     Vascular: No carotid bruit or JVD.  Cardiovascular:     Rate and Rhythm: Normal rate and regular rhythm.     Heart sounds: Normal heart sounds.     No gallop.  Pulmonary:     Effort: Pulmonary effort is normal. No respiratory distress.  Breath sounds: Normal breath sounds. No stridor. No wheezing, rhonchi (tender over anterior upper L ribs) or rales.     Comments: Tender over ant/upper L ribs  No step off or crepitus No skin changes  Chest:     Chest wall: Tenderness present.  Abdominal:     General: There is no distension or abdominal bruit.     Palpations: Abdomen is soft.  Musculoskeletal:     Cervical back: Normal range of motion and neck supple.     Right lower leg: No edema.     Left lower leg: No edema.  Lymphadenopathy:     Cervical: No cervical adenopathy.  Skin:    General: Skin is warm and dry.     Coloration: Skin is not jaundiced or pale.     Findings: No bruising, erythema or rash.  Neurological:     Mental Status: She is alert.     Cranial Nerves: No cranial nerve deficit.     Coordination: Coordination normal.     Deep Tendon Reflexes: Reflexes are normal and symmetric. Reflexes normal.  Psychiatric:        Attention and Perception: Attention normal.        Mood and Affect: Mood is depressed.        Cognition and Memory: Cognition and memory normal.     Comments: Candidly discusses symptoms and stressors  (grief)            Assessment & Plan:   Problem List Items Addressed This Visit       Cardiovascular and Mediastinum   CHF (congestive heart failure)    Reviewed last cardiology note  and echo Taking ace and also lasix 20 mg daily  No symptoms Due for cardiology f/u - referral done      Essential hypertension    bp in fair control at this time  BP Readings from Last 1 Encounters:  12/23/22 124/72  No changes needed Most recent labs reviewed  Disc lifstyle change with low sodium diet and exercise  bp in fair control at this time  BP Readings from Last 1 Encounters:  12/23/22 124/72  No changes needed Most recent labs reviewed  Disc lifstyle change with low sodium diet and exercise  Plan to continuue Lisinopril 5 mg daily and metoprolol xl 12.5 mg daily and lasix 20 mg daily        Paroxysmal SVT (supraventricular tachycardia)    No recent re occurrence  On metoprolol Due for cardiology f/u         Other   Dyspepsia    Recent /on off  Reassuring exam  Pepcid px 20 mg bid for several weeks then prn Update if not starting to improve in a week or if worsening   Stress recently /death of daughter may have worsened this       Grief reaction    Recently lost daughter Lost SIL and husband both in the past 4 years Reviewed stressors/ coping techniques/symptoms/ support sources/ tx options and side effects in detail today  Offered ref for grief counseling - she may consider later  Thinks she is coping ok  Enc to remember self care       Left-sided chest wall pain - Primary    Soreness and tenderness -L anter ribs w/o trauma  Reassuring exam and vital signs  Disc poss of strain vs costochondritis  Cxr is clear of lung or rib changes   Inst to use warm compress Prn tylenol  Can try topical volteren gel as well  ER precautions noted Update if not starting to improve in a week or if worsening        Relevant Orders   DG Ribs Unilateral W/Chest Left (Completed)

## 2022-12-23 NOTE — Patient Instructions (Addendum)
Use a warm compress on your chest if it hurts  Tylenol is ok  If needed- get voltaren gel over the counter 1%  - small amount to affected area I suspect this is from ribs/ muscle and will get better   If worse or not improved let us know   Chest xray today  We will call with result   Generic pepcid 20 mg twice daily for the stomach symptoms  Take it for 1-2 weeks to calm things down Update if not starting to improve in a week or if worsening    You are due for your yearly follow up with cardiology (Dr Rockey Situ)  I put the referral in  Please let us know if you don't hear in 1-2 weeks

## 2022-12-23 NOTE — Assessment & Plan Note (Signed)
Recent /on off  Reassuring exam  Pepcid px 20 mg bid for several weeks then prn Update if not starting to improve in a week or if worsening   Stress recently /death of daughter may have worsened this

## 2022-12-24 ENCOUNTER — Telehealth: Payer: Self-pay | Admitting: *Deleted

## 2022-12-24 NOTE — Telephone Encounter (Signed)
-----   Message from Judy Pimple, MD sent at 12/23/2022  1:36 PM EDT ----- Chest and rib xay looks normal  Very reassuring  Use the warm compresses and tylenol and voltaren gel if needed Update if not starting to improve in a week or if worsening   There is a renal stone in the left kidney noted incidentally  This is not adding to pain (only causes pain when passing)  Keep up a good fluid intake

## 2022-12-24 NOTE — Telephone Encounter (Signed)
Left VM requesting pt to call the office back 

## 2022-12-29 NOTE — Telephone Encounter (Signed)
Addressed through result notes  

## 2023-01-15 ENCOUNTER — Other Ambulatory Visit: Payer: Self-pay | Admitting: Family Medicine

## 2023-01-25 ENCOUNTER — Ambulatory Visit: Payer: Medicare HMO | Admitting: Family

## 2023-02-08 DIAGNOSIS — Z0000 Encounter for general adult medical examination without abnormal findings: Secondary | ICD-10-CM | POA: Diagnosis not present

## 2023-02-08 DIAGNOSIS — I110 Hypertensive heart disease with heart failure: Secondary | ICD-10-CM | POA: Diagnosis not present

## 2023-05-03 DIAGNOSIS — H2513 Age-related nuclear cataract, bilateral: Secondary | ICD-10-CM | POA: Diagnosis not present

## 2023-06-17 ENCOUNTER — Other Ambulatory Visit: Payer: Self-pay | Admitting: Family

## 2023-06-17 DIAGNOSIS — I509 Heart failure, unspecified: Secondary | ICD-10-CM

## 2023-07-01 ENCOUNTER — Ambulatory Visit (INDEPENDENT_AMBULATORY_CARE_PROVIDER_SITE_OTHER): Payer: Medicare HMO | Admitting: Family Medicine

## 2023-07-01 ENCOUNTER — Encounter: Payer: Self-pay | Admitting: Family Medicine

## 2023-07-01 VITALS — BP 100/64 | HR 66 | Temp 98.3°F | Ht 63.0 in | Wt 118.0 lb

## 2023-07-01 DIAGNOSIS — R1084 Generalized abdominal pain: Secondary | ICD-10-CM | POA: Insufficient documentation

## 2023-07-01 DIAGNOSIS — R3129 Other microscopic hematuria: Secondary | ICD-10-CM | POA: Diagnosis not present

## 2023-07-01 LAB — POC URINALSYSI DIPSTICK (AUTOMATED)
Bilirubin, UA: NEGATIVE
Blood, UA: POSITIVE
Glucose, UA: NEGATIVE
Ketones, UA: NEGATIVE
Leukocytes, UA: NEGATIVE
Nitrite, UA: NEGATIVE
Protein, UA: NEGATIVE
Spec Grav, UA: 1.02 (ref 1.010–1.025)
Urobilinogen, UA: 0.2 U/dL
pH, UA: 6 (ref 5.0–8.0)

## 2023-07-01 LAB — CBC WITH DIFFERENTIAL/PLATELET
Basophils Absolute: 0.1 10*3/uL (ref 0.0–0.1)
Basophils Relative: 0.9 % (ref 0.0–3.0)
Eosinophils Absolute: 0.1 10*3/uL (ref 0.0–0.7)
Eosinophils Relative: 1 % (ref 0.0–5.0)
HCT: 41.2 % (ref 36.0–46.0)
Hemoglobin: 13.1 g/dL (ref 12.0–15.0)
Lymphocytes Relative: 22.3 % (ref 12.0–46.0)
Lymphs Abs: 1.6 10*3/uL (ref 0.7–4.0)
MCHC: 31.9 g/dL (ref 30.0–36.0)
MCV: 92.8 fL (ref 78.0–100.0)
Monocytes Absolute: 0.6 10*3/uL (ref 0.1–1.0)
Monocytes Relative: 9.2 % (ref 3.0–12.0)
Neutro Abs: 4.7 10*3/uL (ref 1.4–7.7)
Neutrophils Relative %: 66.6 % (ref 43.0–77.0)
Platelets: 220 10*3/uL (ref 150.0–400.0)
RBC: 4.43 Mil/uL (ref 3.87–5.11)
RDW: 15.1 % (ref 11.5–15.5)
WBC: 7.1 10*3/uL (ref 4.0–10.5)

## 2023-07-01 LAB — COMPREHENSIVE METABOLIC PANEL
ALT: 13 U/L (ref 0–35)
AST: 17 U/L (ref 0–37)
Albumin: 4.5 g/dL (ref 3.5–5.2)
Alkaline Phosphatase: 44 U/L (ref 39–117)
BUN: 21 mg/dL (ref 6–23)
CO2: 31 meq/L (ref 19–32)
Calcium: 9.9 mg/dL (ref 8.4–10.5)
Chloride: 103 meq/L (ref 96–112)
Creatinine, Ser: 0.93 mg/dL (ref 0.40–1.20)
GFR: 61.72 mL/min (ref >60.00)
Glucose, Bld: 85 mg/dL (ref 70–99)
Potassium: 3.9 meq/L (ref 3.5–5.1)
Sodium: 141 meq/L (ref 135–145)
Total Bilirubin: 0.7 mg/dL (ref 0.2–1.2)
Total Protein: 7.4 g/dL (ref 6.0–8.3)

## 2023-07-01 LAB — LIPASE: Lipase: 47 U/L (ref 11.0–59.0)

## 2023-07-01 NOTE — Assessment & Plan Note (Addendum)
Chronic, noted in the past.  Treated for UTI in February but culture returned negative.  Patient never returned for repeat urinalysis.  No current urinary symptoms.  Patient with history of kidney stone seen on CT scan. Current pain not consistent with kidney stone.  Addendum: Urinalysis shows persistent hematuria 3+ without sign of infection.  Will move forward with referral to urology for further evaluation of cause.

## 2023-07-01 NOTE — Progress Notes (Addendum)
Patient ID: Madison Miller, female    DOB: 1950/10/12, 72 y.o.   MRN: [id number]  This visit was conducted in person.  BP 100/64   Pulse 66   Temp 98.3 F (36.8 C) (Oral)   Ht 5\' 3"  (1.6 m)   Wt 118 lb (53.5 kg)   SpO2 99%   BMI 20.90 kg/m    CC:  Chief Complaint  Patient presents with   GI Problem    Pt complains of stomach pain. States the pain feels like tight cramps when you have to do a bowel movement. Ongoing for 3-4 days. States she has released bowel movement 3x in a day. States she is going more than usual. No incontinence     Subjective:   HPI: Madison Miller is a 72 y.o. female  patient of Mort Sawyers, NP presenting on 07/01/2023 for GI Problem (Pt complains of stomach pain. States the pain feels like tight cramps when you have to do a bowel movement. Ongoing for 3-4 days. States she has released bowel movement 3x in a day. States she is going more than usual. No incontinence )  She reports new onset abdominal pain, described as cramping in 3-4 days.   Pain is generalized, 7/10 on pain scale  Not worsening.  Increase in stool frequency.Marland Kitchen not water, solid.  No blood in stool.  No dysuria, no frequency.  No fever.  No N/V.  Occ congestion and cough in last week.   Has not taken anything for it. No diet changes. No sick contacts   2011 nml colonoscopy.  CT in 2022 showed nonobstructive renal stone and cholithiasis   She is drinking lots of water. Eating normally.   Has had hematuria in past. Treated for UTI in past... no repeat eval for clearance of blood from urine.  History of congestive heart failure, hypertension, SVT paroxysmal, stage III chronic kidney disease   She also notes in passing finger soreness and occasional vaginal itching on the side x 1 year, negative evaluation for this in the past with  Relevant past medical, surgical, family and social history reviewed and updated as indicated. Interim medical history since our last visit  reviewed. Allergies and medications reviewed and updated. Outpatient Medications Prior to Visit  Medication Sig Dispense Refill   acetaminophen (TYLENOL) 500 MG tablet Take 500 mg by mouth as needed for moderate pain or headache.      ciclopirox (PENLAC) 8 % solution Apply topically at bedtime. Apply over nail and surrounding skin. Apply daily over previous coat. After seven (7) days, may remove with alcohol and continue cycle. 6.6 mL 0   famotidine (PEPCID) 20 MG tablet TAKE 1 TABLET BY MOUTH TWICE A DAY 180 tablet 1   furosemide (LASIX) 20 MG tablet TAKE 1 TABLET BY MOUTH EVERY DAY 90 tablet 1   lisinopril (ZESTRIL) 5 MG tablet Take 1 tablet (5 mg total) by mouth daily. 90 tablet 3   metoprolol succinate (TOPROL-XL) 25 MG 24 hr tablet Take 0.5 tablets (12.5 mg total) by mouth daily. 90 tablet 3   potassium chloride (KLOR-CON) 10 MEQ tablet TAKE 1 TABLET BY MOUTH EVERY DAY 90 tablet 3   No facility-administered medications prior to visit.     Per HPI unless specifically indicated in ROS section below Review of Systems  Constitutional:  Negative for fatigue and fever.  HENT:  Positive for congestion.   Eyes:  Negative for pain.  Respiratory:  Positive for cough. Negative for shortness  of breath.   Cardiovascular:  Negative for chest pain, palpitations and leg swelling.  Gastrointestinal:  Positive for abdominal pain. Negative for blood in stool.  Genitourinary:  Negative for dysuria, hematuria and vaginal bleeding.  Musculoskeletal:  Negative for back pain.  Neurological:  Negative for syncope, light-headedness and headaches.  Psychiatric/Behavioral:  Negative for dysphoric mood.    Objective:  BP 100/64   Pulse 66   Temp 98.3 F (36.8 C) (Oral)   Ht 5\' 3"  (1.6 m)   Wt 118 lb (53.5 kg)   SpO2 99%   BMI 20.90 kg/m   Wt Readings from Last 3 Encounters:  07/06/23 117 lb (53.1 kg)  07/01/23 118 lb (53.5 kg)  12/23/22 113 lb 6 oz (51.4 kg)      Physical Exam Constitutional:       General: She is not in acute distress.    Appearance: Normal appearance. She is well-developed. She is not ill-appearing or toxic-appearing.  HENT:     Head: Normocephalic.     Right Ear: Hearing, tympanic membrane, ear canal and external ear normal. Tympanic membrane is not erythematous, retracted or bulging.     Left Ear: Hearing, tympanic membrane, ear canal and external ear normal. Tympanic membrane is not erythematous, retracted or bulging.     Nose: No mucosal edema or rhinorrhea.     Right Sinus: No maxillary sinus tenderness or frontal sinus tenderness.     Left Sinus: No maxillary sinus tenderness or frontal sinus tenderness.     Mouth/Throat:     Mouth: Oropharynx is clear and moist and mucous membranes are normal.     Pharynx: Uvula midline.  Eyes:     General: Lids are normal. Lids are everted, no foreign bodies appreciated.     Extraocular Movements: EOM normal.     Conjunctiva/sclera: Conjunctivae normal.     Pupils: Pupils are equal, round, and reactive to light.  Neck:     Thyroid: No thyroid mass or thyromegaly.     Vascular: No carotid bruit.     Trachea: Trachea normal.  Cardiovascular:     Rate and Rhythm: Normal rate and regular rhythm.     Pulses: Normal pulses.     Heart sounds: Normal heart sounds, S1 normal and S2 normal. No murmur heard.    No friction rub. No gallop.  Pulmonary:     Effort: Pulmonary effort is normal. No tachypnea or respiratory distress.     Breath sounds: Normal breath sounds. No decreased breath sounds, wheezing, rhonchi or rales.  Abdominal:     General: Abdomen is flat. Bowel sounds are normal.     Palpations: Abdomen is soft.     Tenderness: There is abdominal tenderness in the right upper quadrant, epigastric area, periumbilical area, suprapubic area and left lower quadrant. There is no right CVA tenderness, left CVA tenderness, guarding or rebound.  Musculoskeletal:     Cervical back: Normal range of motion and neck supple.   Skin:    General: Skin is warm, dry and intact.     Findings: No rash.  Neurological:     Mental Status: She is alert.  Psychiatric:        Mood and Affect: Mood is not anxious or depressed.        Speech: Speech normal.        Behavior: Behavior normal. Behavior is cooperative.        Thought Content: Thought content normal.        Cognition  and Memory: Cognition and memory normal.        Judgment: Judgment normal.       Results for orders placed or performed in visit on 07/01/23  Lipase  Result Value Ref Range   Lipase 47.0 11.0 - 59.0 U/L  Comprehensive metabolic panel  Result Value Ref Range   Sodium 141 135 - 145 mEq/L   Potassium 3.9 3.5 - 5.1 mEq/L   Chloride 103 96 - 112 mEq/L   CO2 31 19 - 32 mEq/L   Glucose, Bld 85 70 - 99 mg/dL   BUN 21 6 - 23 mg/dL   Creatinine, Ser 0.86 0.40 - 1.20 mg/dL   Total Bilirubin 0.7 0.2 - 1.2 mg/dL   Alkaline Phosphatase 44 39 - 117 U/L   AST 17 0 - 37 U/L   ALT 13 0 - 35 U/L   Total Protein 7.4 6.0 - 8.3 g/dL   Albumin 4.5 3.5 - 5.2 g/dL   GFR 57.84 >69.62 mL/min   Calcium 9.9 8.4 - 10.5 mg/dL  CBC with Differential/Platelet  Result Value Ref Range   WBC 7.1 4.0 - 10.5 K/uL   RBC 4.43 3.87 - 5.11 Mil/uL   Hemoglobin 13.1 12.0 - 15.0 g/dL   HCT 95.2 84.1 - 32.4 %   MCV 92.8 78.0 - 100.0 fl   MCHC 31.9 30.0 - 36.0 g/dL   RDW 40.1 02.7 - 25.3 %   Platelets 220.0 150.0 - 400.0 K/uL   Neutrophils Relative % 66.6 43.0 - 77.0 %   Lymphocytes Relative 22.3 12.0 - 46.0 %   Monocytes Relative 9.2 3.0 - 12.0 %   Eosinophils Relative 1.0 0.0 - 5.0 %   Basophils Relative 0.9 0.0 - 3.0 %   Neutro Abs 4.7 1.4 - 7.7 K/uL   Lymphs Abs 1.6 0.7 - 4.0 K/uL   Monocytes Absolute 0.6 0.1 - 1.0 K/uL   Eosinophils Absolute 0.1 0.0 - 0.7 K/uL   Basophils Absolute 0.1 0.0 - 0.1 K/uL  POCT Urinalysis Dipstick (Automated)  Result Value Ref Range   Color, UA light yellow    Clarity, UA clear    Glucose, UA Negative Negative   Bilirubin, UA neg     Ketones, UA neg    Spec Grav, UA 1.020 1.010 - 1.025   Blood, UA pos    pH, UA 6.0 5.0 - 8.0   Protein, UA Negative Negative   Urobilinogen, UA 0.2 0.2 or 1.0 E.U./dL   Nitrite, UA neg    Leukocytes, UA Negative Negative    Assessment and Plan  Generalized abdominal pain Assessment & Plan: Acute, patient nontoxic and not in distress at appointment.  Possible viral gastroenteritis Will check urinalysis to rule out urinary infection as well as to repeat check for hematuria. Patient with normal BMs but increasing recently.  No clear sign of obstruction or constipation. Will evaluate with labs including complete metabolic panel lipase and CBC. Given history of cholelithiasis seen on CT possible gallbladder disease causing symptoms but no clear sign of cholecystitis.  Consider evaluation with ultrasound right upper quadrant.  Return and ER precautions provided.  Orders: -     Lipase -     Comprehensive metabolic panel -     CBC with Differential/Platelet -     POCT Urinalysis Dipstick (Automated)  Microscopic hematuria Assessment & Plan: Chronic, noted in the past.  Treated for UTI in February but culture returned negative.  Patient never returned for repeat urinalysis.  No current urinary  symptoms.  Patient with history of kidney stone seen on CT scan. Current pain not consistent with kidney stone.  Addendum: Urinalysis shows persistent hematuria 3+ without sign of infection.  Will move forward with referral to urology for further evaluation of cause.  Orders: -     Ambulatory referral to Urology    No follow-ups on file.   Kerby Nora, MD

## 2023-07-01 NOTE — Assessment & Plan Note (Signed)
Acute, patient nontoxic and not in distress at appointment.  Possible viral gastroenteritis Will check urinalysis to rule out urinary infection as well as to repeat check for hematuria. Patient with normal BMs but increasing recently.  No clear sign of obstruction or constipation. Will evaluate with labs including complete metabolic panel lipase and CBC. Given history of cholelithiasis seen on CT possible gallbladder disease causing symptoms but no clear sign of cholecystitis.  Consider evaluation with ultrasound right upper quadrant.  Return and ER precautions provided.

## 2023-07-04 ENCOUNTER — Telehealth: Payer: Self-pay

## 2023-07-04 NOTE — Telephone Encounter (Signed)
Sending note to [place] [facility].

## 2023-07-04 NOTE — Telephone Encounter (Signed)
See Result Note.

## 2023-07-05 ENCOUNTER — Other Ambulatory Visit: Payer: Self-pay | Admitting: Family Medicine

## 2023-07-05 DIAGNOSIS — R1084 Generalized abdominal pain: Secondary | ICD-10-CM

## 2023-07-05 NOTE — Progress Notes (Signed)
Left message for Madison Miller that ultrasound has been ordered and someone will be reach out to her to get that schedule.  Advised to use Tylenol for pain but if pain is sever or she is unable to keep any liquids down, she needs to go to the emergency room per Dr. Ermalene Searing.  I ask that she call me back if she has any questions.

## 2023-07-05 NOTE — Progress Notes (Signed)
Right upper quadrant ultrasound ordered.  Patient can use Tylenol for pain.  If pain is severe or she is unable to keep down liquids she needs to go to the emergency room.

## 2023-07-06 ENCOUNTER — Encounter: Payer: Self-pay | Admitting: Family Medicine

## 2023-07-06 ENCOUNTER — Ambulatory Visit (INDEPENDENT_AMBULATORY_CARE_PROVIDER_SITE_OTHER): Payer: Medicare HMO | Admitting: Family Medicine

## 2023-07-06 ENCOUNTER — Ambulatory Visit
Admission: RE | Admit: 2023-07-06 | Discharge: 2023-07-06 | Disposition: A | Discharge status: Home / Self Care | Payer: Medicare HMO | Source: Ambulatory Visit | Attending: Family Medicine | Admitting: Family Medicine

## 2023-07-06 VITALS — BP 120/74 | HR 69 | Temp 98.5°F | Ht 63.0 in | Wt 117.0 lb

## 2023-07-06 DIAGNOSIS — R1084 Generalized abdominal pain: Secondary | ICD-10-CM | POA: Diagnosis not present

## 2023-07-06 DIAGNOSIS — R109 Unspecified abdominal pain: Secondary | ICD-10-CM | POA: Insufficient documentation

## 2023-07-06 DIAGNOSIS — R0789 Other chest pain: Secondary | ICD-10-CM

## 2023-07-06 DIAGNOSIS — R319 Hematuria, unspecified: Secondary | ICD-10-CM | POA: Diagnosis not present

## 2023-07-06 DIAGNOSIS — N200 Calculus of kidney: Secondary | ICD-10-CM | POA: Diagnosis not present

## 2023-07-06 NOTE — Patient Instructions (Addendum)
Start Prilosec daily 2 tablets of 20 mg daily.  We will set up CT and [facility] to evaluate pain further.  Go to ER if severe abdominal pain.

## 2023-07-06 NOTE — Assessment & Plan Note (Signed)
Acute, concerning for possible renal stone.

## 2023-07-06 NOTE — Assessment & Plan Note (Signed)
Acute, patient reports she has had this intermittently in the past.  Told in the past it was a chest wall muscle strain.  Differential includes costochondritis versus reflux versus anxiety.  Symptoms are atypical and nonexertional, tenderness to palpation on chest wall, unlikely to be cardiac in nature.

## 2023-07-06 NOTE — Assessment & Plan Note (Signed)
Acute, unclear etiology Differential includes gallbladder disease, kidney stone, functional abdominal pain.  Gastroenteritis unlikely given no diarrhea, fever.  Complete metabolic panel CBC and lipase normal on 10/11. Now with some chest soreness that is described as nonexertional and does not sound cardiac.  Possible gastritis/reflux.  Will start Prilosec 40 mg p.o. daily. Urinalysis clear at office visit on 10/11 History of persistent hematuria, not helpful in determining whether kidney stone is causing ongoing pain but with significant CVA tenderness, will do stat CT abdomen and pelvis.

## 2023-07-06 NOTE — Progress Notes (Signed)
Patient ID: Madison Miller, female    DOB: Feb 06, 1951, 72 y.o.   MRN: [id number]  This visit was conducted in person.  BP 120/74 (BP Location: Left Arm, Patient Position: Sitting, Cuff Size: Normal)   Pulse 69   Temp 98.5 F (36.9 C) (Oral)   Ht 5\' 3"  (1.6 m)   Wt 117 lb (53.1 kg)   SpO2 97%   BMI 20.73 kg/m    CC:  Chief Complaint  Patient presents with   Chest Pain    /soreness x 2-3 days. Patient states she has also been having abd pain x 5 days or so. Was seen last week by another provider.     Subjective:   HPI: Madison Miller is a 72 y.o. female presenting on 07/06/2023 for Chest Pain (/soreness x 2-3 days. Patient states she has also been having abd pain x 5 days or so. Was seen last week by another provider. )  Patient reports continued abdominal pain ongoing for 5 days.  Reviewed office visit from last week. Urinalysis was clear except for persistent hematuria: Urology referral in progress CBC, CMP, lipase within normal limits.  Patient reported through MyChart about continued abdominal issues and ultrasound complete abdomen was ordered, but is not yet set up.  Today she also read ports chest soreness x 2 to 3 days. Constant.  No change with movement or eating, lying down at night.   No heartburn  Abd continues to be generalized... 7/10 on pain scale. Worsened pain all over abdomen.. pain now in right flank  No D/C,  NO N/V.  No dysuria.   Has had chronic hematuria.  Told she has stones  in kidney and gallbladder in past.  She is  not using pepcid AC for GERD     No using NSAIDs.   Relevant past medical, surgical, family and social history reviewed and updated as indicated. Interim medical history since our last visit reviewed. Allergies and medications reviewed and updated. Outpatient Medications Prior to Visit  Medication Sig Dispense Refill   acetaminophen (TYLENOL) 500 MG tablet Take 500 mg by mouth as needed for moderate pain or headache.       ciclopirox (PENLAC) 8 % solution Apply topically at bedtime. Apply over nail and surrounding skin. Apply daily over previous coat. After seven (7) days, may remove with alcohol and continue cycle. 6.6 mL 0   famotidine (PEPCID) 20 MG tablet TAKE 1 TABLET BY MOUTH TWICE A DAY 180 tablet 1   furosemide (LASIX) 20 MG tablet TAKE 1 TABLET BY MOUTH EVERY DAY 90 tablet 1   lisinopril (ZESTRIL) 5 MG tablet Take 1 tablet (5 mg total) by mouth daily. 90 tablet 3   metoprolol succinate (TOPROL-XL) 25 MG 24 hr tablet Take 0.5 tablets (12.5 mg total) by mouth daily. 90 tablet 3   potassium chloride (KLOR-CON) 10 MEQ tablet TAKE 1 TABLET BY MOUTH EVERY DAY 90 tablet 3   No facility-administered medications prior to visit.     Per HPI unless specifically indicated in ROS section below Review of Systems  Constitutional:  Negative for fatigue and fever.  HENT:  Negative for congestion.   Eyes:  Negative for pain.  Respiratory:  Negative for cough and shortness of breath.   Cardiovascular:  Negative for chest pain, palpitations and leg swelling.  Gastrointestinal:  Positive for abdominal pain. Negative for blood in stool, constipation, nausea and vomiting.  Genitourinary:  Positive for hematuria. Negative for dysuria, urgency and vaginal  bleeding.  Musculoskeletal:  Negative for back pain.  Neurological:  Negative for syncope, light-headedness and headaches.  Psychiatric/Behavioral:  Negative for dysphoric mood.    Objective:  BP 120/74 (BP Location: Left Arm, Patient Position: Sitting, Cuff Size: Normal)   Pulse 69   Temp 98.5 F (36.9 C) (Oral)   Ht 5\' 3"  (1.6 m)   Wt 117 lb (53.1 kg)   SpO2 97%   BMI 20.73 kg/m   Wt Readings from Last 3 Encounters:  07/06/23 117 lb (53.1 kg)  07/01/23 118 lb (53.5 kg)  12/23/22 113 lb 6 oz (51.4 kg)      Physical Exam Constitutional:      General: She is not in acute distress.    Appearance: Normal appearance. She is well-developed. She is not  ill-appearing or toxic-appearing.  HENT:     Head: Normocephalic.     Right Ear: Hearing, tympanic membrane, ear canal and external ear normal. Tympanic membrane is not erythematous, retracted or bulging.     Left Ear: Hearing, tympanic membrane, ear canal and external ear normal. Tympanic membrane is not erythematous, retracted or bulging.     Nose: No mucosal edema or rhinorrhea.     Right Sinus: No maxillary sinus tenderness or frontal sinus tenderness.     Left Sinus: No maxillary sinus tenderness or frontal sinus tenderness.     Mouth/Throat:     Mouth: Oropharynx is clear and moist and mucous membranes are normal.     Pharynx: Uvula midline.  Eyes:     General: Lids are normal. Lids are everted, no foreign bodies appreciated.     Extraocular Movements: EOM normal.     Conjunctiva/sclera: Conjunctivae normal.     Pupils: Pupils are equal, round, and reactive to light.  Neck:     Thyroid: No thyroid mass or thyromegaly.     Vascular: No carotid bruit.     Trachea: Trachea normal.  Cardiovascular:     Rate and Rhythm: Normal rate and regular rhythm.     Pulses: Normal pulses.     Heart sounds: Normal heart sounds, S1 normal and S2 normal. No murmur heard.    No friction rub. No gallop.  Pulmonary:     Effort: Pulmonary effort is normal. No tachypnea or respiratory distress.     Breath sounds: Normal breath sounds. No decreased breath sounds, wheezing, rhonchi or rales.  Chest:     Chest wall: Tenderness present. No mass or swelling.     Comments:  Ttp left anterior chest wall Abdominal:     General: Bowel sounds are normal.     Palpations: Abdomen is soft.     Tenderness: There is generalized abdominal tenderness. There is right CVA tenderness. There is no guarding or rebound.     Comments: Patient jumps on tap to right CVA  Musculoskeletal:     Cervical back: Normal range of motion and neck supple.  Skin:    General: Skin is warm, dry and intact.     Findings: No rash.   Neurological:     Mental Status: She is alert.  Psychiatric:        Mood and Affect: Mood is not anxious or depressed.        Speech: Speech normal.        Behavior: Behavior normal. Behavior is cooperative.        Thought Content: Thought content normal.        Cognition and Memory: Cognition and memory normal.  Judgment: Judgment normal.       Results for orders placed or performed in visit on 07/01/23  Lipase  Result Value Ref Range   Lipase 47.0 11.0 - 59.0 U/L  Comprehensive metabolic panel  Result Value Ref Range   Sodium 141 135 - 145 mEq/L   Potassium 3.9 3.5 - 5.1 mEq/L   Chloride 103 96 - 112 mEq/L   CO2 31 19 - 32 mEq/L   Glucose, Bld 85 70 - 99 mg/dL   BUN 21 6 - 23 mg/dL   Creatinine, Ser 1.61 0.40 - 1.20 mg/dL   Total Bilirubin 0.7 0.2 - 1.2 mg/dL   Alkaline Phosphatase 44 39 - 117 U/L   AST 17 0 - 37 U/L   ALT 13 0 - 35 U/L   Total Protein 7.4 6.0 - 8.3 g/dL   Albumin 4.5 3.5 - 5.2 g/dL   GFR 09.60 >45.40 mL/min   Calcium 9.9 8.4 - 10.5 mg/dL  CBC with Differential/Platelet  Result Value Ref Range   WBC 7.1 4.0 - 10.5 K/uL   RBC 4.43 3.87 - 5.11 Mil/uL   Hemoglobin 13.1 12.0 - 15.0 g/dL   HCT 98.1 19.1 - 47.8 %   MCV 92.8 78.0 - 100.0 fl   MCHC 31.9 30.0 - 36.0 g/dL   RDW 29.5 62.1 - 30.8 %   Platelets 220.0 150.0 - 400.0 K/uL   Neutrophils Relative % 66.6 43.0 - 77.0 %   Lymphocytes Relative 22.3 12.0 - 46.0 %   Monocytes Relative 9.2 3.0 - 12.0 %   Eosinophils Relative 1.0 0.0 - 5.0 %   Basophils Relative 0.9 0.0 - 3.0 %   Neutro Abs 4.7 1.4 - 7.7 K/uL   Lymphs Abs 1.6 0.7 - 4.0 K/uL   Monocytes Absolute 0.6 0.1 - 1.0 K/uL   Eosinophils Absolute 0.1 0.0 - 0.7 K/uL   Basophils Absolute 0.1 0.0 - 0.1 K/uL  POCT Urinalysis Dipstick (Automated)  Result Value Ref Range   Color, UA light yellow    Clarity, UA clear    Glucose, UA Negative Negative   Bilirubin, UA neg    Ketones, UA neg    Spec Grav, UA 1.020 1.010 - 1.025   Blood, UA pos     pH, UA 6.0 5.0 - 8.0   Protein, UA Negative Negative   Urobilinogen, UA 0.2 0.2 or 1.0 E.U./dL   Nitrite, UA neg    Leukocytes, UA Negative Negative    Assessment and Plan  Flank pain Assessment & Plan: Acute, concerning for possible renal stone.   Generalized abdominal pain Assessment & Plan: Acute, unclear etiology Differential includes gallbladder disease, kidney stone, functional abdominal pain.  Gastroenteritis unlikely given no diarrhea, fever.  Complete metabolic panel CBC and lipase normal on 10/11. Now with some chest soreness that is described as nonexertional and does not sound cardiac.  Possible gastritis/reflux.  Will start Prilosec 40 mg p.o. daily. Urinalysis clear at office visit on 10/11 History of persistent hematuria, not helpful in determining whether kidney stone is causing ongoing pain but with significant CVA tenderness, will do stat CT abdomen and pelvis.  Orders: -     CT ABDOMEN PELVIS WO CONTRAST; Future  Chest wall pain Assessment & Plan: Acute, patient reports she has had this intermittently in the past.  Told in the past it was a chest wall muscle strain.  Differential includes costochondritis versus reflux versus anxiety.  Symptoms are atypical and nonexertional, tenderness to palpation on chest wall,  unlikely to be cardiac in nature.       No follow-ups on file.   Kerby Nora, MD

## 2023-07-06 NOTE — Addendum Note (Signed)
Addended by: Kerby Nora E on: 07/06/2023 12:39 PM   Modules accepted: Orders

## 2023-07-07 ENCOUNTER — Encounter: Payer: Self-pay | Admitting: Family Medicine

## 2023-07-07 ENCOUNTER — Encounter: Payer: Self-pay | Admitting: *Deleted

## 2023-07-07 ENCOUNTER — Ambulatory Visit: Payer: Medicare HMO | Admitting: Family Medicine

## 2023-07-07 VITALS — BP 106/62 | HR 62 | Temp 98.1°F | Ht 63.0 in | Wt 117.1 lb

## 2023-07-07 DIAGNOSIS — R252 Cramp and spasm: Secondary | ICD-10-CM | POA: Insufficient documentation

## 2023-07-07 DIAGNOSIS — R1084 Generalized abdominal pain: Secondary | ICD-10-CM

## 2023-07-07 LAB — CBC WITH DIFFERENTIAL/PLATELET
Basophils Absolute: 0 10*3/uL (ref 0.0–0.1)
Basophils Relative: 0.6 % (ref 0.0–3.0)
Eosinophils Absolute: 0.1 10*3/uL (ref 0.0–0.7)
Eosinophils Relative: 1 % (ref 0.0–5.0)
HCT: 40.8 % (ref 36.0–46.0)
Hemoglobin: 12.9 g/dL (ref 12.0–15.0)
Lymphocytes Relative: 22.2 % (ref 12.0–46.0)
Lymphs Abs: 1.7 10*3/uL (ref 0.7–4.0)
MCHC: 31.6 g/dL (ref 30.0–36.0)
MCV: 92.6 fL (ref 78.0–100.0)
Monocytes Absolute: 0.7 10*3/uL (ref 0.1–1.0)
Monocytes Relative: 8.9 % (ref 3.0–12.0)
Neutro Abs: 5.2 10*3/uL (ref 1.4–7.7)
Neutrophils Relative %: 67.3 % (ref 43.0–77.0)
Platelets: 231 10*3/uL (ref 150.0–400.0)
RBC: 4.41 Mil/uL (ref 3.87–5.11)
RDW: 14.8 % (ref 11.5–15.5)
WBC: 7.7 10*3/uL (ref 4.0–10.5)

## 2023-07-07 LAB — BASIC METABOLIC PANEL
BUN: 20 mg/dL (ref 6–23)
CO2: 31 meq/L (ref 19–32)
Calcium: 9.8 mg/dL (ref 8.4–10.5)
Chloride: 102 meq/L (ref 96–112)
Creatinine, Ser: 1 mg/dL (ref 0.40–1.20)
GFR: 56.56 mL/min — ABNORMAL LOW (ref >60.00)
Glucose, Bld: 90 mg/dL (ref 70–99)
Potassium: 3.9 meq/L (ref 3.5–5.1)
Sodium: 141 meq/L (ref 135–145)

## 2023-07-07 LAB — IBC + FERRITIN
Ferritin: 55.7 ng/mL (ref 10.0–291.0)
Iron: 71 ug/dL (ref 42–145)
Saturation Ratios: 21.8 % (ref 20.0–50.0)
TIBC: 326.2 ug/dL (ref 250.0–450.0)
Transferrin: 233 mg/dL (ref 212.0–360.0)

## 2023-07-07 LAB — MAGNESIUM: Magnesium: 2 mg/dL (ref 1.5–2.5)

## 2023-07-07 NOTE — Assessment & Plan Note (Signed)
Acute, potentially secondary to decreased p.o. intake given recent abdominal symptoms.  Will check potassium and renal function with labs today.  Recommend keeping up with fluids.  Will also evaluate for low iron as cause of leg cramps. She did states she had improvement in leg cramps with pickle juice so it may be related to lactic acid. Recommended heat, gentle stretching and can use tonic water or yellow mustard for symptoms.  She can also apply diclofenac gel over-the-counter 4 times a day as needed for pain. There is no peripheral swelling and no concern for DVT.  Return and ER precautions provided

## 2023-07-07 NOTE — Patient Instructions (Signed)
Can use over the counter Voltaren gel four times daily for pain.  Apply heat on legs  and gentle stretching.  Take Prilosec 40 mg daily (2 tablets of 20 mg daily).  Keep up with fluid intake.  Please stop at the lab to have labs drawn.

## 2023-07-07 NOTE — Progress Notes (Signed)
Patient ID: ANATALIA Miller, female    DOB: [birth year], 72 y.o.   MRN: [id number]  This visit was conducted in person.  BP 106/62   Pulse 62   Temp 98.1 F (36.7 C) (Oral)   Ht 5\' 3"  (1.6 m)   Wt 117 lb 2 oz (53.1 kg)   SpO2 97%   BMI 20.75 kg/m    CC:  Chief Complaint  Patient presents with   Leg Pain    C/o B leg cramps, worse in R leg. Started 07/05/23.     Subjective:   HPI: Madison Miller is a 72 y.o. female presenting on 07/07/2023 for Leg Pain (C/o B leg cramps, worse in R leg. Started 07/05/23. )   She has been having cramping in  bilateral legs for 2 days.  No  swelling.  Treated with warm compress and stretching.  Drank pickle.   Labs from 10/11 showing no electrolyte issue.  She has been keeping up with liquids.    Recent abd pain.Mar[facility] Kitchen eval showed unremarkable CT scan.. likely cause is gastritis.Marland Kitchen told to start prilosec but she has not yet.  Referral for urology pending.    She is on lasix and  Kdur for  CHF.  Relevant past medical, surgical, family and social history reviewed and updated as indicated. Interim medical history since our last visit reviewed. Allergies and medications reviewed and updated. Outpatient Medications Prior to Visit  Medication Sig Dispense Refill   acetaminophen (TYLENOL) 500 MG tablet Take 500 mg by mouth as needed for moderate pain or headache.      ciclopirox (PENLAC) 8 % solution Apply topically at bedtime. Apply over nail and surrounding skin. Apply daily over previous coat. After seven (7) days, may remove with alcohol and continue cycle. 6.6 mL 0   famotidine (PEPCID) 20 MG tablet TAKE 1 TABLET BY MOUTH TWICE A DAY 180 tablet 1   furosemide (LASIX) 20 MG tablet TAKE 1 TABLET BY MOUTH EVERY DAY 90 tablet 1   lisinopril (ZESTRIL) 5 MG tablet Take 1 tablet (5 mg total) by mouth daily. 90 tablet 3   metoprolol succinate (TOPROL-XL) 25 MG 24 hr tablet Take 0.5 tablets (12.5 mg total) by mouth daily. 90 tablet 3    potassium chloride (KLOR-CON) 10 MEQ tablet TAKE 1 TABLET BY MOUTH EVERY DAY 90 tablet 3   No facility-administered medications prior to visit.     Per HPI unless specifically indicated in ROS section below Review of Systems  Constitutional:  Negative for fatigue and fever.  HENT:  Negative for congestion.   Eyes:  Negative for pain.  Respiratory:  Negative for cough and shortness of breath.   Cardiovascular:  Negative for chest pain, palpitations and leg swelling.  Gastrointestinal:  Negative for abdominal pain.  Genitourinary:  Negative for dysuria and vaginal bleeding.  Musculoskeletal:  Positive for myalgias. Negative for back pain.  Neurological:  Negative for syncope, light-headedness and headaches.  Psychiatric/Behavioral:  Negative for dysphoric mood.    Objective:  BP 106/62   Pulse 62   Temp 98.1 F (36.7 C) (Oral)   Ht 5\' 3"  (1.6 m)   Wt 117 lb 2 oz (53.1 kg)   SpO2 97%   BMI 20.75 kg/m   Wt Readings from Last 3 Encounters:  07/07/23 117 lb 2 oz (53.1 kg)  07/06/23 117 lb (53.1 kg)  07/01/23 118 lb (53.5 kg)      Physical Exam Constitutional:      General:  She is not in acute distress.    Appearance: Normal appearance. She is well-developed. She is not ill-appearing or toxic-appearing.  HENT:     Head: Normocephalic.     Right Ear: Hearing, tympanic membrane, ear canal and external ear normal. Tympanic membrane is not erythematous, retracted or bulging.     Left Ear: Hearing, tympanic membrane, ear canal and external ear normal. Tympanic membrane is not erythematous, retracted or bulging.     Nose: No mucosal edema or rhinorrhea.     Right Sinus: No maxillary sinus tenderness or frontal sinus tenderness.     Left Sinus: No maxillary sinus tenderness or frontal sinus tenderness.     Mouth/Throat:     Mouth: Oropharynx is clear and moist and mucous membranes are normal.     Pharynx: Uvula midline.  Eyes:     General: Lids are normal. Lids are everted, no  foreign bodies appreciated.     Extraocular Movements: EOM normal.     Conjunctiva/sclera: Conjunctivae normal.     Pupils: Pupils are equal, round, and reactive to light.  Neck:     Thyroid: No thyroid mass or thyromegaly.     Vascular: No carotid bruit.     Trachea: Trachea normal.  Cardiovascular:     Rate and Rhythm: Normal rate and regular rhythm.     Pulses: Normal pulses.     Heart sounds: Normal heart sounds, S1 normal and S2 normal. No murmur heard.    No friction rub. No gallop.  Pulmonary:     Effort: Pulmonary effort is normal. No tachypnea or respiratory distress.     Breath sounds: Normal breath sounds. No decreased breath sounds, wheezing, rhonchi or rales.  Abdominal:     General: Bowel sounds are normal.     Palpations: Abdomen is soft.     Tenderness: There is no abdominal tenderness.  Musculoskeletal:     Cervical back: Normal range of motion and neck supple.     Right lower leg: Tenderness present.     Left lower leg: Tenderness present.     Right ankle: No swelling.     Left ankle: No swelling.  Skin:    General: Skin is warm, dry and intact.     Findings: No rash.  Neurological:     Mental Status: She is alert.  Psychiatric:        Mood and Affect: Mood is not anxious or depressed.        Speech: Speech normal.        Behavior: Behavior normal. Behavior is cooperative.        Thought Content: Thought content normal.        Cognition and Memory: Cognition and memory normal.        Judgment: Judgment normal.       Results for orders placed or performed in visit on 07/01/23  Lipase  Result Value Ref Range   Lipase 47.0 11.0 - 59.0 U/L  Comprehensive metabolic panel  Result Value Ref Range   Sodium 141 135 - 145 mEq/L   Potassium 3.9 3.5 - 5.1 mEq/L   Chloride 103 96 - 112 mEq/L   CO2 31 19 - 32 mEq/L   Glucose, Bld 85 70 - 99 mg/dL   BUN 21 6 - 23 mg/dL   Creatinine, Ser 1.61 0.40 - 1.20 mg/dL   Total Bilirubin 0.7 0.2 - 1.2 mg/dL   Alkaline  Phosphatase 44 39 - 117 U/L   AST 17 0 -  37 U/L   ALT 13 0 - 35 U/L   Total Protein 7.4 6.0 - 8.3 g/dL   Albumin 4.5 3.5 - 5.2 g/dL   GFR 40.98 >11.91 mL/min   Calcium 9.9 8.4 - 10.5 mg/dL  CBC with Differential/Platelet  Result Value Ref Range   WBC 7.1 4.0 - 10.5 K/uL   RBC 4.43 3.87 - 5.11 Mil/uL   Hemoglobin 13.1 12.0 - 15.0 g/dL   HCT 47.8 29.5 - 62.1 %   MCV 92.8 78.0 - 100.0 fl   MCHC 31.9 30.0 - 36.0 g/dL   RDW 30.8 65.7 - 84.6 %   Platelets 220.0 150.0 - 400.0 K/uL   Neutrophils Relative % 66.6 43.0 - 77.0 %   Lymphocytes Relative 22.3 12.0 - 46.0 %   Monocytes Relative 9.2 3.0 - 12.0 %   Eosinophils Relative 1.0 0.0 - 5.0 %   Basophils Relative 0.9 0.0 - 3.0 %   Neutro Abs 4.7 1.4 - 7.7 K/uL   Lymphs Abs 1.6 0.7 - 4.0 K/uL   Monocytes Absolute 0.6 0.1 - 1.0 K/uL   Eosinophils Absolute 0.1 0.0 - 0.7 K/uL   Basophils Absolute 0.1 0.0 - 0.1 K/uL  POCT Urinalysis Dipstick (Automated)  Result Value Ref Range   Color, UA light yellow    Clarity, UA clear    Glucose, UA Negative Negative   Bilirubin, UA neg    Ketones, UA neg    Spec Grav, UA 1.020 1.010 - 1.025   Blood, UA pos    pH, UA 6.0 5.0 - 8.0   Protein, UA Negative Negative   Urobilinogen, UA 0.2 0.2 or 1.0 E.U./dL   Nitrite, UA neg    Leukocytes, UA Negative Negative    Assessment and Plan  There are no diagnoses linked to this encounter.  No follow-ups on file.   Kerby Nora, MD

## 2023-07-07 NOTE — Assessment & Plan Note (Signed)
Acute, again CT reviewed with patient in detail.  Unremarkable except for nonobstructing renal stones.  She does have an appointment set for visit to urology for chronic hematuria. Symptoms may be most likely related to gastritis will have her use Prilosec 40 mg daily for 4 to 6 weeks, she will call sooner if symptoms are not improving as expected.

## 2023-08-01 ENCOUNTER — Ambulatory Visit (INDEPENDENT_AMBULATORY_CARE_PROVIDER_SITE_OTHER): Payer: Medicare HMO | Admitting: Family Medicine

## 2023-08-01 ENCOUNTER — Encounter: Payer: Self-pay | Admitting: Family Medicine

## 2023-08-01 VITALS — BP 124/60 | HR 66 | Temp 98.1°F | Ht 63.0 in | Wt 118.0 lb

## 2023-08-01 DIAGNOSIS — R109 Unspecified abdominal pain: Secondary | ICD-10-CM

## 2023-08-01 DIAGNOSIS — M545 Low back pain, unspecified: Secondary | ICD-10-CM | POA: Diagnosis not present

## 2023-08-01 LAB — POC URINALSYSI DIPSTICK (AUTOMATED)
Bilirubin, UA: NEGATIVE
Blood, UA: 50 — AB
Glucose, UA: NEGATIVE
Ketones, UA: NEGATIVE
Nitrite, UA: NEGATIVE
Protein, UA: NEGATIVE
Spec Grav, UA: 1.025 (ref 1.010–1.025)
Urobilinogen, UA: 0.2 U/dL
pH, UA: 5.5 (ref 5.0–8.0)

## 2023-08-01 MED ORDER — METHOCARBAMOL 500 MG PO TABS: 500.0000 mg | ORAL_TABLET | Freq: Every evening | ORAL | 1 refills | Status: DC | PRN

## 2023-08-01 MED ORDER — PREDNISONE 10 MG PO TABS: ORAL_TABLET | ORAL | 0 refills | Status: DC

## 2023-08-01 NOTE — Assessment & Plan Note (Signed)
Has micro hematuria on urinalysis Reviewed notes from  History of non obst kidney stones  Has urology appointment upcoming   Today pain seems to be more in left LS than flank Also positional  More likely msk in origin  See a/p for low back pain

## 2023-08-01 NOTE — Assessment & Plan Note (Addendum)
Positional back pain in setting of history of DDD with past LS fusion/laminectomy  Chronic/worsened today  Reviewed last imaging  In left lumbar musculature (less so flank)  Does radiate to leg occasionally  Worse after inactivity  Worse today with flex and right lateral bend  No new neuro changes   Urinalysis today- baseline micro rbc / has urology appointment coming up for that and non obs renal stones   Will try course of prednisone for pain 30 mg taper (discussed possible side effects) Also muscle relaxer at night -methocarbamol (or covered alt) - with caution of sedation   Encouraged walking as tolerated May consider PT if not improvement  Watch for neuro signs and symptoms  Filed out handicapped parking form today as some days back pain limits her from walking far

## 2023-08-01 NOTE — Progress Notes (Signed)
Subjective:    Patient ID: Madison Miller, female    DOB: 07-Jan-1951, 72 y.o.   MRN: [id number]  HPI  Wt Readings from Last 3 Encounters:  08/01/23 118 lb (53.5 kg)  07/07/23 117 lb 2 oz (53.1 kg)  07/06/23 117 lb (53.1 kg)   20.90 kg/m  Vitals:   08/01/23 1157  BP: 124/60  Pulse: 66  Temp: 98.1 F (36.7 C)  SpO2: 100%    Pt presents with c/o back pain and flank pain  Has history of non obst renal stones and chronic microscopic hematuria   Past history of spinal stenosis and laminectomy in the past with fusion of L4-5 L5-S1  Pain in left back and flank area  Since mid last week  No injury or new activity   Is worse in am - more stiff and achey Loosens up as day goes on   Sometimes it shoots down her leg  No neuro changes of leg or foot   Has a good matress     Over the counter  Tylenol  Ibuprofen /advil  Has used icy hot type product  Heat helps while it is on   No new urinary symptoms No dysuria  No visible blood  No freq or urgency    Imaging from 2021  Associated Dx: Acute midline low back pain without sciatica    Study Result CLINICAL DATA: Acute mid to low back pain without sciatica.  EXAM: LUMBAR SPINE - COMPLETE 4+ VIEW  COMPARISON: 10/26/2016  FINDINGS: Exam demonstrates mild spondylosis throughout the lumbar spine. Stable grade 1 anterolisthesis of L5 on S1. Posterior fusion hardware intact from L4-S1 with interbody fusion at the L4-5 and L5-S1 levels unchanged. Remaining disc spaces are unremarkable. 1.4 cm dense oval calcification over the left upper quadrant and 1 cm calcification over the right mid abdomen as these may represent renal stones.  IMPRESSION: 1. Mild spondylosis of the lumbar spine with stable grade 1 anterolisthesis of L5 on S1. Stable postsurgical changes with posterior fusion hardware intact from L4-S1.  2. Calcifications over the upper quadrants bilaterally possibly representing renal stones.  Did  see Dr Ermalene Searing mid oct for leg cramps  Office Visit on 07/07/2023  Component Date Value Ref Range Status   WBC 07/07/2023 7.7  4.0 - 10.5 K/uL Final   RBC 07/07/2023 4.41  3.87 - 5.11 Mil/uL Final   Hemoglobin 07/07/2023 12.9  12.0 - 15.0 g/dL Final   HCT 40/98/1191 40.8  36.0 - 46.0 % Final   MCV 07/07/2023 92.6  78.0 - 100.0 fl Final   MCHC 07/07/2023 31.6  30.0 - 36.0 g/dL Final   RDW 47/82/9562 14.8  11.5 - 15.5 % Final   Platelets 07/07/2023 231.0  150.0 - 400.0 K/uL Final   Neutrophils Relative % 07/07/2023 67.3  43.0 - 77.0 % Final   Lymphocytes Relative 07/07/2023 22.2  12.0 - 46.0 % Final   Monocytes Relative 07/07/2023 8.9  3.0 - 12.0 % Final   Eosinophils Relative 07/07/2023 1.0  0.0 - 5.0 % Final   Basophils Relative 07/07/2023 0.6  0.0 - 3.0 % Final   Neutro Abs 07/07/2023 5.2  1.4 - 7.7 K/uL Final   Lymphs Abs 07/07/2023 1.7  0.7 - 4.0 K/uL Final   Monocytes Absolute 07/07/2023 0.7  0.1 - 1.0 K/uL Final   Eosinophils Absolute 07/07/2023 0.1  0.0 - 0.7 K/uL Final   Basophils Absolute 07/07/2023 0.0  0.0 - 0.1 K/uL Final   Iron 07/07/2023  71  42 - 145 ug/dL Final   Transferrin 15/40/0867 233.0  212.0 - 360.0 mg/dL Final   Saturation Ratios 07/07/2023 21.8  20.0 - 50.0 % Final   Ferritin 07/07/2023 55.7  10.0 - 291.0 ng/mL Final   TIBC 07/07/2023 326.2  250.0 - 450.0 mcg/dL Final   Sodium 61/95/0932 141  135 - 145 mEq/L Final   Potassium 07/07/2023 3.9  3.5 - 5.1 mEq/L Final   Chloride 07/07/2023 102  96 - 112 mEq/L Final   CO2 07/07/2023 31  19 - 32 mEq/L Final   Glucose, Bld 07/07/2023 90  70 - 99 mg/dL Final   BUN 67/08/4579 20  6 - 23 mg/dL Final   Creatinine, Ser 07/07/2023 1.00  0.40 - 1.20 mg/dL Final   GFR 99/83/3825 56.56 (L)  >60.00 mL/min Final   Calculated using the CKD-EPI Creatinine Equation (2021)   Calcium 07/07/2023 9.8  8.4 - 10.5 mg/dL Final   Magnesium 05/39/7673 2.0  1.5 - 2.5 mg/dL Final   Labs were reassuring  Urinalysis today  Results for  orders placed or performed in visit on 08/01/23  POCT Urinalysis Dipstick (Automated)  Result Value Ref Range   Color, UA Light Yellow    Clarity, UA Clear    Glucose, UA Negative Negative   Bilirubin, UA Negative    Ketones, UA Negative    Spec Grav, UA 1.025 1.010 - 1.025   Blood, UA 50 Ery/uL (A)    pH, UA 5.5 5.0 - 8.0   Protein, UA Negative Negative   Urobilinogen, UA 0.2 0.2 or 1.0 E.U./dL   Nitrite, UA Negative    Leukocytes, UA Trace (A) Negative   Baseline rbc Has urology appointment upcoming for this    Patient Active Problem List   Diagnosis Date Noted   Leg cramps 07/07/2023   Flank pain 07/06/2023   Generalized abdominal pain 07/01/2023   Grief reaction 12/23/2022   Dyspepsia 12/23/2022   Microscopic hematuria 10/28/2022   Acute pain of right knee 10/28/2022   Acute nonintractable headache 10/28/2022   Toenail fungus 07/05/2022   Polyarthralgia 07/05/2022   Stage 3a chronic kidney disease (CKD) (HCC) 02/01/2022   Osteopenia 11/27/2021   Mixed hyperlipidemia 11/16/2021   Nephrolithiasis 11/16/2021   Paroxysmal SVT (supraventricular tachycardia) (HCC) 03/28/2021   Essential hypertension 03/27/2021   CHF (congestive heart failure) (HCC) 10/17/2017   Left low back pain 09/30/2017   Chest wall pain    ALLERGIC RHINITIS, SEASONAL 07/03/2008   Past Medical History:  Diagnosis Date   CHF (congestive heart failure) (HCC)    GERD (gastroesophageal reflux disease)    HTN (hypertension)    Hyperparathyroidism    Migraine, unspecified, with intractable migraine, so stated, without mention of status migrainosus    Right bundle branch block and left anterior fascicular block    SVT (supraventricular tachycardia) (HCC)    Past Surgical History:  Procedure Laterality Date   LUMBAR LAMINECTOMY  2010   Dr. Phoebe Perch   PARATHYROIDECTOMY  2009   Social History   Tobacco Use   Smoking status: Never   Smokeless tobacco: Never  Vaping Use   Vaping status: Never Used   Substance Use Topics   Alcohol use: No    Alcohol/week: 0.0 standard drinks of alcohol   Drug use: No   Family History  Problem Relation Age of Onset   Other Mother        barin tumor   Stroke Father    Sudden death Daughter  Colon cancer Neg Hx    Rectal cancer Neg Hx    Stomach cancer Neg Hx    Allergies  Allergen Reactions   Lactose Other (See Comments)    Pain,bloating   Lactose Intolerance (Gi) Other (See Comments)    Pain,bloating   Current Outpatient Medications on File Prior to Visit  Medication Sig Dispense Refill   acetaminophen (TYLENOL) 500 MG tablet Take 500 mg by mouth as needed for moderate pain or headache.      ciclopirox (PENLAC) 8 % solution Apply topically at bedtime. Apply over nail and surrounding skin. Apply daily over previous coat. After seven (7) days, may remove with alcohol and continue cycle. 6.6 mL 0   famotidine (PEPCID) 20 MG tablet TAKE 1 TABLET BY MOUTH TWICE A DAY 180 tablet 1   furosemide (LASIX) 20 MG tablet TAKE 1 TABLET BY MOUTH EVERY DAY 90 tablet 1   lisinopril (ZESTRIL) 5 MG tablet Take 1 tablet (5 mg total) by mouth daily. 90 tablet 3   metoprolol succinate (TOPROL-XL) 25 MG 24 hr tablet Take 0.5 tablets (12.5 mg total) by mouth daily. 90 tablet 3   potassium chloride (KLOR-CON) 10 MEQ tablet TAKE 1 TABLET BY MOUTH EVERY DAY 90 tablet 3   No current facility-administered medications on file prior to visit.    Review of Systems     Objective:   Physical Exam Constitutional:      General: She is not in acute distress.    Appearance: Normal appearance. She is well-developed and normal weight. She is not ill-appearing.  HENT:     Head: Normocephalic and atraumatic.  Eyes:     Conjunctiva/sclera: Conjunctivae normal.     Pupils: Pupils are equal, round, and reactive to light.  Neck:     Thyroid: No thyromegaly.     Vascular: No carotid bruit or JVD.  Cardiovascular:     Rate and Rhythm: Normal rate and regular rhythm.      Heart sounds: Normal heart sounds.     No gallop.  Pulmonary:     Effort: Pulmonary effort is normal. No respiratory distress.     Breath sounds: Normal breath sounds. No stridor. No wheezing, rhonchi or rales.  Abdominal:     General: There is no distension or abdominal bruit.     Palpations: Abdomen is soft.  Musculoskeletal:     Cervical back: Normal range of motion and neck supple.     Thoracic back: No swelling, deformity, spasms, tenderness or bony tenderness.     Lumbar back: Spasms and tenderness present. No swelling, deformity, signs of trauma or bony tenderness. Decreased range of motion. Negative right straight leg raise test and negative left straight leg raise test.     Right lower leg: No edema.     Left lower leg: No edema.     Comments: Pain to flex over 30 deg  Pain with left lateral bend and twist  Improves with ext (though this is limited by fusion of low LS) No bony tenderness No cva tenderness   No neuro changes   Lymphadenopathy:     Cervical: No cervical adenopathy.  Skin:    General: Skin is warm and dry.     Coloration: Skin is not pale.     Findings: No rash.  Neurological:     Mental Status: She is alert.     Motor: No weakness.     Coordination: Coordination normal.     Gait: Gait normal.  Deep Tendon Reflexes: Reflexes are normal and symmetric. Reflexes normal.  Psychiatric:        Mood and Affect: Mood normal.           Assessment & Plan:   Problem List Items Addressed This Visit       Other   Flank pain    Has micro hematuria on urinalysis Reviewed notes from  History of non obst kidney stones  Has urology appointment upcoming   Today pain seems to be more in left LS than flank Also positional  More likely msk in origin  See a/p for low back pain         Relevant Orders   POCT Urinalysis Dipstick (Automated) (Completed)   Left low back pain - Primary    Positional back pain in setting of history of DDD with past LS  fusion/laminectomy  Chronic/worsened today  Reviewed last imaging  In left lumbar musculature (less so flank)  Does radiate to leg occasionally  Worse after inactivity  Worse today with flex and right lateral bend  No new neuro changes   Urinalysis today- baseline micro rbc / has urology appointment coming up for that and non obs renal stones   Will try course of prednisone for pain 30 mg taper (discussed possible side effects) Also muscle relaxer at night -methocarbamol (or covered alt) - with caution of sedation   Encouraged walking as tolerated May consider PT if not improvement  Watch for neuro signs and symptoms  Filed out handicapped parking form today as some days back pain limits her from walking far       Relevant Medications   predniSONE (DELTASONE) 10 MG tablet   methocarbamol (ROBAXIN) 500 MG tablet

## 2023-08-01 NOTE — Patient Instructions (Addendum)
Slow walking is great for back pain  Pedaling is also good   Continue heat as needed - 10 minutes at a time  Take prednisone as directed (this is for back pain from inflammation) The muscle relaxer at bedtime (be careful of sedation)  Tylenol is ok over the counter   Muscle rub like ben gay or icy hot is ok also

## 2023-09-01 DIAGNOSIS — N761 Subacute and chronic vaginitis: Secondary | ICD-10-CM | POA: Diagnosis not present

## 2023-09-01 DIAGNOSIS — M25512 Pain in left shoulder: Secondary | ICD-10-CM | POA: Diagnosis not present

## 2023-09-01 DIAGNOSIS — M50321 Other cervical disc degeneration at C4-C5 level: Secondary | ICD-10-CM | POA: Diagnosis not present

## 2023-09-01 DIAGNOSIS — M5412 Radiculopathy, cervical region: Secondary | ICD-10-CM | POA: Diagnosis not present

## 2023-09-01 DIAGNOSIS — R042 Hemoptysis: Secondary | ICD-10-CM | POA: Diagnosis not present

## 2023-09-01 DIAGNOSIS — M778 Other enthesopathies, not elsewhere classified: Secondary | ICD-10-CM | POA: Diagnosis not present

## 2023-09-01 DIAGNOSIS — M50322 Other cervical disc degeneration at C5-C6 level: Secondary | ICD-10-CM | POA: Diagnosis not present

## 2023-09-01 DIAGNOSIS — J188 Other pneumonia, unspecified organism: Secondary | ICD-10-CM | POA: Diagnosis not present

## 2023-09-08 ENCOUNTER — Other Ambulatory Visit: Payer: Self-pay

## 2023-09-08 ENCOUNTER — Other Ambulatory Visit: Payer: Self-pay | Admitting: Family

## 2023-09-08 DIAGNOSIS — I10 Essential (primary) hypertension: Secondary | ICD-10-CM

## 2023-09-08 DIAGNOSIS — I509 Heart failure, unspecified: Secondary | ICD-10-CM

## 2023-09-08 MED ORDER — LISINOPRIL 5 MG PO TABS: 5.0000 mg | ORAL_TABLET | Freq: Every day | ORAL | 0 refills | Status: DC

## 2023-09-08 MED ORDER — METOPROLOL SUCCINATE ER 25 MG PO TB24: 12.5000 mg | ORAL_TABLET | Freq: Every day | ORAL | 0 refills | Status: DC

## 2023-09-08 MED ORDER — FUROSEMIDE 20 MG PO TABS: 20.0000 mg | ORAL_TABLET | Freq: Every day | ORAL | 0 refills | Status: DC

## 2023-09-08 NOTE — Telephone Encounter (Signed)
Copied from CRM [id number]. Topic: Clinical - Medication Refill >> Sep 08, 2023 10:50 AM Orinda Kenner C wrote: Most Recent Primary Care Visit:  Provider: Roxy Manns A  Department: [facility]  Visit Type: SAME DAY  Date: 08/01/2023  Medication: furosemide (LASIX) 20 MG tablet  metoprolol succinate (TOPROL-XL) 25 MG 24 hr tablet  lisinopril (ZESTRIL) 5 MG tablet    Has the patient contacted their pharmacy? Yes (Agent: If no, request that the patient contact the pharmacy for the refill. If patient does not wish to contact the pharmacy document the reason why and proceed with request.) (Agent: If yes, when and what did the pharmacy advise?)  Is this the correct pharmacy for this prescription? Yes If no, delete pharmacy and type the correct one.  This is the patient's preferred pharmacy:   [facility] [phone] [facility] [place] - [address] [place] [address] Phone: [phone] Fax: [phone]   Has the prescription been filled recently?   Is the patient out of the medication? No, trying to prevent from running out  Has the patient been seen for an appointment in the last year OR does the patient have an upcoming appointment?   Can we respond through MyChart? No, pls c/b [phone]  Agent: Please be advised that Rx refills may take up to 3 business days. We ask that you follow-up with your pharmacy.

## 2023-09-08 NOTE — Telephone Encounter (Signed)
Pt needs appt prior to next refill 

## 2023-09-20 DIAGNOSIS — M542 Cervicalgia: Secondary | ICD-10-CM | POA: Diagnosis not present

## 2023-09-20 DIAGNOSIS — M5412 Radiculopathy, cervical region: Secondary | ICD-10-CM | POA: Diagnosis not present

## 2023-09-28 DIAGNOSIS — N898 Other specified noninflammatory disorders of vagina: Secondary | ICD-10-CM | POA: Diagnosis not present

## 2023-10-05 DIAGNOSIS — M542 Cervicalgia: Secondary | ICD-10-CM | POA: Diagnosis not present

## 2023-10-05 DIAGNOSIS — M5412 Radiculopathy, cervical region: Secondary | ICD-10-CM | POA: Diagnosis not present

## 2023-10-20 ENCOUNTER — Encounter: Payer: Self-pay | Admitting: Family

## 2023-10-20 ENCOUNTER — Ambulatory Visit (INDEPENDENT_AMBULATORY_CARE_PROVIDER_SITE_OTHER): Payer: Medicare HMO | Admitting: Family

## 2023-10-20 VITALS — BP 110/60 | HR 76 | Temp 98.1°F | Ht 63.0 in | Wt 123.0 lb

## 2023-10-20 DIAGNOSIS — Z981 Arthrodesis status: Secondary | ICD-10-CM | POA: Diagnosis not present

## 2023-10-20 DIAGNOSIS — M5412 Radiculopathy, cervical region: Secondary | ICD-10-CM

## 2023-10-20 DIAGNOSIS — B351 Tinea unguium: Secondary | ICD-10-CM | POA: Diagnosis not present

## 2023-10-20 DIAGNOSIS — E782 Mixed hyperlipidemia: Secondary | ICD-10-CM

## 2023-10-20 DIAGNOSIS — M792 Neuralgia and neuritis, unspecified: Secondary | ICD-10-CM | POA: Diagnosis not present

## 2023-10-20 DIAGNOSIS — R944 Abnormal results of kidney function studies: Secondary | ICD-10-CM

## 2023-10-20 DIAGNOSIS — M542 Cervicalgia: Secondary | ICD-10-CM | POA: Diagnosis not present

## 2023-10-20 DIAGNOSIS — I471 Supraventricular tachycardia, unspecified: Secondary | ICD-10-CM

## 2023-10-20 DIAGNOSIS — I10 Essential (primary) hypertension: Secondary | ICD-10-CM

## 2023-10-20 LAB — LIPID PANEL
Cholesterol: 171 mg/dL (ref 0–200)
HDL: 58.6 mg/dL (ref >39.00)
LDL Cholesterol: 88 mg/dL (ref 0–99)
NonHDL: 112.83
Total CHOL/HDL Ratio: 3
Triglycerides: 125 mg/dL (ref 0.0–149.0)
VLDL: 25 mg/dL (ref 0.0–40.0)

## 2023-10-20 LAB — MICROALBUMIN / CREATININE URINE RATIO
Creatinine,U: 25.7 mg/dL
Microalb Creat Ratio: 2.7 mg/g (ref 0.0–30.0)
Microalb, Ur: <0.7 mg/dL (ref 0.0–1.9)

## 2023-10-20 LAB — COMPREHENSIVE METABOLIC PANEL
ALT: 21 U/L (ref 0–35)
AST: 31 U/L (ref 0–37)
Albumin: 4.3 g/dL (ref 3.5–5.2)
Alkaline Phosphatase: 43 U/L (ref 39–117)
BUN: 16 mg/dL (ref 6–23)
CO2: 30 meq/L (ref 19–32)
Calcium: 9.5 mg/dL (ref 8.4–10.5)
Chloride: 106 meq/L (ref 96–112)
Creatinine, Ser: 0.95 mg/dL (ref 0.40–1.20)
GFR: 60.03 mL/min (ref >60.00)
Glucose, Bld: 87 mg/dL (ref 70–99)
Potassium: 3.8 meq/L (ref 3.5–5.1)
Sodium: 144 meq/L (ref 135–145)
Total Bilirubin: 0.5 mg/dL (ref 0.2–1.2)
Total Protein: 6.9 g/dL (ref 6.0–8.3)

## 2023-10-20 LAB — VITAMIN B12: Vitamin B-12: 445 pg/mL (ref 211–911)

## 2023-10-20 MED ORDER — CICLOPIROX 8 % EX SOLN: Freq: Every day | CUTANEOUS | 0 refills | Status: AC

## 2023-10-20 NOTE — Progress Notes (Signed)
Established Patient Office Visit  Subjective:      CC:  Chief Complaint  Patient presents with   Medical Management of Chronic Issues    HPI: Madison Miller is a 73 y.o. female presenting on 10/20/2023 for Medical Management of Chronic Issues . Mammogram, pt refuses she states she doesn't want one despite the risks.   Colonoscopy, also the same pt refuses. She is not going to follow up for one 'anytime soon' her last colonoscopy 2011. Did have a cologuard 7/22 was negative.   Lower back pain with sciatica (left side) as well as neck pain with muscle spasm. She does state will occasionally radiate down into her left lower arm. She has had surgery in the past. She was seen in the office a few months ago for a flare up and was given prednisone taper and methocarbamol with some improvement in symptoms for short term. She does see Dr. Mariah Milling at [facility] for cervicalgia. Recommendation last office visit 10/05/23 was to increase gabapetnin to 200 mg twice daily but pt declined. Physical therapy also recommended but again declined. If sx continue to work Dr. Mariah Milling suggested MRI cervical spine.   Paroxysmal SVT in setting of elevated calcium. seen by Dr. Mariah Milling last visit 12/28/21. She is overdue for an appt. Cardiac MRI in march 2009 , no evidence infiltrative disease EF 58% holter monitor jan 2010 occasional PAC. On lasix 20 mg once daily.         Social history:  Relevant past medical, surgical, family and social history reviewed and updated as indicated. Interim medical history since our last visit reviewed.  Allergies and medications reviewed and updated.  DATA REVIEWED: CHART IN EPIC     ROS: Negative unless specifically indicated above in HPI.    Current Outpatient Medications:    acetaminophen (TYLENOL) 500 MG tablet, Take 500 mg by mouth as needed for moderate pain or headache. , Disp: , Rfl:    famotidine (PEPCID) 20 MG tablet, TAKE 1 TABLET BY MOUTH TWICE A DAY,  Disp: 180 tablet, Rfl: 1   furosemide (LASIX) 20 MG tablet, Take 1 tablet (20 mg total) by mouth daily., Disp: 90 tablet, Rfl: 0   methocarbamol (ROBAXIN) 500 MG tablet, Take 1 tablet (500 mg total) by mouth at bedtime as needed for muscle spasms (back pain)., Disp: 20 tablet, Rfl: 1   metoprolol succinate (TOPROL-XL) 25 MG 24 hr tablet, Take 0.5 tablets (12.5 mg total) by mouth daily., Disp: 90 tablet, Rfl: 0   potassium chloride (KLOR-CON) 10 MEQ tablet, TAKE 1 TABLET BY MOUTH EVERY DAY, Disp: 90 tablet, Rfl: 3   ciclopirox (PENLAC) 8 % solution, Apply topically at bedtime. Apply over nail and surrounding skin. Apply daily over previous coat. After seven (7) days, may remove with alcohol and continue cycle., Disp: 6.6 mL, Rfl: 0   lisinopril (ZESTRIL) 5 MG tablet, Take 1 tablet (5 mg total) by mouth daily., Disp: 90 tablet, Rfl: 0      Objective:    BP 110/60   Pulse 76   Temp 98.1 F (36.7 C) (Temporal)   Ht 5\' 3"  (1.6 m)   Wt 123 lb (55.8 kg)   SpO2 98%   BMI 21.79 kg/m   Wt Readings from Last 3 Encounters:  10/20/23 123 lb (55.8 kg)  08/01/23 118 lb (53.5 kg)  07/07/23 117 lb 2 oz (53.1 kg)    Physical Exam Constitutional:      General: She is not in acute distress.  Appearance: Normal appearance. She is normal weight. She is not ill-appearing, toxic-appearing or diaphoretic.  HENT:     Head: Normocephalic.  Cardiovascular:     Rate and Rhythm: Normal rate and regular rhythm.  Pulmonary:     Effort: Pulmonary effort is normal.     Breath sounds: Normal breath sounds.  Musculoskeletal:        General: Normal range of motion.     Right lower leg: No edema.     Left lower leg: No edema.  Skin:    Comments: Fungal infection multiple toenails bil  Neurological:     General: No focal deficit present.     Mental Status: She is alert and oriented to person, place, and time. Mental status is at baseline.  Psychiatric:        Mood and Affect: Mood normal.         Behavior: Behavior normal.        Thought Content: Thought content normal.        Judgment: Judgment normal.           Assessment & Plan:  Cervicalgia -     Ambulatory referral to Physical Therapy  Cervical radiculitis Assessment & Plan: Referral placed for physical therapy  Cont f/u with Dr. Mariah Milling at [facility]  Encouraged pt to increase gabapentin to 200 mg twice daily  Advised pt to f/u as they may consider MRI due to recent note  Orders: -     Ambulatory referral to Physical Therapy  History of fusion of lumbar spine -     Ambulatory referral to Physical Therapy  Mixed hyperlipidemia -     Lipid panel  Decreased calculated GFR -     Comprehensive metabolic panel  Essential hypertension -     Microalbumin / creatinine urine ratio -     Comprehensive metabolic panel  Nerve pain -     Vitamin B12  Toenail fungus Assessment & Plan: Trial penlac  If no improvement can consider terbinafine  Orders: -     Ciclopirox; Apply topically at bedtime. Apply over nail and surrounding skin. Apply daily over previous coat. After seven (7) days, may remove with alcohol and continue cycle.  Dispense: 6.6 mL; Refill: 0  Paroxysmal SVT (supraventricular tachycardia) (HCC) Assessment & Plan: Reminded pt to f/u with cardiology as overdue. Cont lasix 20 mg      Return in about 6 months (around 04/18/2024) for f/u blood pressure.  Mort Sawyers, MSN, APRN, FNP-C [facility]

## 2023-10-20 NOTE — Progress Notes (Signed)
Your labs look great Cholesterol is improved

## 2023-10-20 NOTE — Patient Instructions (Addendum)
  Call cardiology to make appointment see below for number  Dr. Mariah Milling  [address] [place], [place] [address] [phone]   A referral was placed today for physical therapy  Please let us know if you have not heard back within 2 weeks about the referral.

## 2023-10-24 NOTE — Assessment & Plan Note (Signed)
Trial penlac  If no improvement can consider terbinafine

## 2023-10-24 NOTE — Assessment & Plan Note (Signed)
Reminded pt to f/u with cardiology as overdue. Cont lasix 20 mg

## 2023-10-24 NOTE — Assessment & Plan Note (Signed)
Referral placed for physical therapy  Cont f/u with Dr. Mariah Milling at [facility]  Encouraged pt to increase gabapentin to 200 mg twice daily  Advised pt to f/u as they may consider MRI due to recent note

## 2023-11-11 ENCOUNTER — Emergency Department: Payer: Medicare HMO

## 2023-11-11 ENCOUNTER — Encounter: Payer: Self-pay | Admitting: Intensive Care

## 2023-11-11 ENCOUNTER — Emergency Department
Admission: EM | Admit: 2023-11-11 | Discharge: 2023-11-11 | Disposition: A | Discharge status: Home / Self Care | Payer: Medicare HMO | Attending: Emergency Medicine | Admitting: Emergency Medicine

## 2023-11-11 ENCOUNTER — Ambulatory Visit: Payer: Self-pay | Admitting: *Deleted

## 2023-11-11 ENCOUNTER — Other Ambulatory Visit: Payer: Self-pay

## 2023-11-11 DIAGNOSIS — K8020 Calculus of gallbladder without cholecystitis without obstruction: Secondary | ICD-10-CM | POA: Diagnosis not present

## 2023-11-11 DIAGNOSIS — K429 Umbilical hernia without obstruction or gangrene: Secondary | ICD-10-CM | POA: Diagnosis not present

## 2023-11-11 DIAGNOSIS — Z20822 Contact with and (suspected) exposure to covid-19: Secondary | ICD-10-CM | POA: Insufficient documentation

## 2023-11-11 DIAGNOSIS — R519 Headache, unspecified: Secondary | ICD-10-CM | POA: Diagnosis not present

## 2023-11-11 DIAGNOSIS — R1032 Left lower quadrant pain: Secondary | ICD-10-CM | POA: Insufficient documentation

## 2023-11-11 DIAGNOSIS — R112 Nausea with vomiting, unspecified: Secondary | ICD-10-CM | POA: Diagnosis not present

## 2023-11-11 DIAGNOSIS — N200 Calculus of kidney: Secondary | ICD-10-CM | POA: Diagnosis not present

## 2023-11-11 LAB — COMPREHENSIVE METABOLIC PANEL
ALT: 13 U/L (ref 0–44)
AST: 21 U/L (ref 15–41)
Albumin: 4.4 g/dL (ref 3.5–5.0)
Alkaline Phosphatase: 41 U/L (ref 38–126)
Anion gap: 11 (ref 5–15)
BUN: 15 mg/dL (ref 8–23)
CO2: 26 mmol/L (ref 22–32)
Calcium: 9.6 mg/dL (ref 8.9–10.3)
Chloride: 103 mmol/L (ref 98–111)
Creatinine, Ser: 0.81 mg/dL (ref 0.44–1.00)
GFR, Estimated: >60 mL/min (ref >60)
Glucose, Bld: 95 mg/dL (ref 70–99)
Potassium: 3.6 mmol/L (ref 3.5–5.1)
Sodium: 140 mmol/L (ref 135–145)
Total Bilirubin: 0.8 mg/dL (ref 0.0–1.2)
Total Protein: 7.6 g/dL (ref 6.5–8.1)

## 2023-11-11 LAB — URINALYSIS, ROUTINE W REFLEX MICROSCOPIC
Bacteria, UA: NONE SEEN
Bilirubin Urine: NEGATIVE
Glucose, UA: NEGATIVE mg/dL
Ketones, ur: 20 mg/dL — AB
Leukocytes,Ua: NEGATIVE
Nitrite: NEGATIVE
Protein, ur: 30 mg/dL — AB
Specific Gravity, Urine: 1.018 (ref 1.005–1.030)
pH: 5 (ref 5.0–8.0)

## 2023-11-11 LAB — CBC
HCT: 41.7 % (ref 36.0–46.0)
Hemoglobin: 13.6 g/dL (ref 12.0–15.0)
MCH: 30 pg (ref 26.0–34.0)
MCHC: 32.6 g/dL (ref 30.0–36.0)
MCV: 91.9 fL (ref 80.0–100.0)
Platelets: 232 10*3/uL (ref 150–400)
RBC: 4.54 MIL/uL (ref 3.87–5.11)
RDW: 13.2 % (ref 11.5–15.5)
WBC: 7.6 10*3/uL (ref 4.0–10.5)
nRBC: 0 % (ref 0.0–0.2)

## 2023-11-11 LAB — RESP PANEL BY RT-PCR (RSV, FLU A&B, COVID)  RVPGX2
Influenza A by PCR: NEGATIVE
Influenza B by PCR: NEGATIVE
Resp Syncytial Virus by PCR: NEGATIVE
SARS Coronavirus 2 by RT PCR: NEGATIVE

## 2023-11-11 LAB — LIPASE, BLOOD: Lipase: 36 U/L (ref 11–51)

## 2023-11-11 MED ORDER — MORPHINE SULFATE (PF) 2 MG/ML IV SOLN
2.0000 mg | Freq: Once | INTRAVENOUS | Status: AC
  Administered 2023-11-11: 2 mg via INTRAVENOUS
  Filled 2023-11-11: qty 1

## 2023-11-11 MED ORDER — ONDANSETRON HCL 4 MG PO TABS: 4.0000 mg | ORAL_TABLET | Freq: Four times a day (QID) | ORAL | 0 refills | Status: AC | PRN

## 2023-11-11 MED ORDER — ONDANSETRON HCL 4 MG/2ML IJ SOLN
4.0000 mg | Freq: Once | INTRAMUSCULAR | Status: AC
  Administered 2023-11-11: 4 mg via INTRAVENOUS
  Filled 2023-11-11: qty 2

## 2023-11-11 MED ORDER — IOHEXOL 300 MG/ML  SOLN
100.0000 mL | Freq: Once | INTRAMUSCULAR | Status: AC | PRN
  Administered 2023-11-11: 100 mL via INTRAVENOUS

## 2023-11-11 NOTE — ED Triage Notes (Signed)
Patient presents with headache and abdominal pain with N/V that started yesterday

## 2023-11-11 NOTE — Discharge Instructions (Signed)
You were seen in the ER today for your vomiting.  Your testing fortunately did not show an emergency cause for this.  I sent a prescription for nausea medicine to your pharmacy that you can take as needed.  Follow with your primary care doctor for further evaluation.  Return to the ER for new or worsening symptoms.

## 2023-11-11 NOTE — ED Provider Notes (Signed)
[facility] Provider Note    Event Date/Time   First MD Initiated Contact with Patient 11/11/23 1625     (approximate)   History   Headache and Abdominal Pain   HPI  Madison Miller is a 73 year old female presenting to the emergency department for evaluation of headache and abdominal pain.  Symptoms started yesterday.  1 episode of vomiting yesterday and today.  Denies diarrhea.  Headache was not sudden onset.  No focal numbness, tingling, weakness.  No recent trauma.  No fevers.     Physical Exam   Triage Vital Signs: ED Triage Vitals  Encounter Vitals Group     BP 11/11/23 1538 (!) 151/100     Systolic BP Percentile --      Diastolic BP Percentile --      Pulse Rate 11/11/23 1536 74     Resp 11/11/23 1536 18     Temp 11/11/23 1536 98.2 F (36.8 C)     Temp Source 11/11/23 1536 Oral     SpO2 11/11/23 1536 100 %     Weight 11/11/23 1536 119 lb (54 kg)     Height 11/11/23 1536 5\' 5"  (1.651 m)     Head Circumference --      Peak Flow --      Pain Score 11/11/23 1536 9     Pain Loc --      Pain Education --      Exclude from Growth Chart --     Most recent vital signs: Vitals:   11/11/23 1930 11/11/23 2108  BP: (!) 133/90   Pulse: 62   Resp:  18  Temp:    SpO2: 99%      General: Awake, interactive  CV:  Regular rate, good peripheral perfusion.  Resp:  Unlabored respirations, lungs good auscultation Abd:  Nondistended, soft, tender to palpation most notably in the left lower quadrant Neuro:  Symmetric facial movement, fluid speech   ED Results / Procedures / Treatments   Labs (all labs ordered are listed, but only abnormal results are displayed) Labs Reviewed  URINALYSIS, ROUTINE W REFLEX MICROSCOPIC - Abnormal; Notable for the following components:      Result Value   Color, Urine YELLOW (*)    APPearance CLEAR (*)    Hgb urine dipstick SMALL (*)    Ketones, ur 20 (*)    Protein, ur 30 (*)    All other components  within normal limits  RESP PANEL BY RT-PCR (RSV, FLU A&B, COVID)  RVPGX2  LIPASE, BLOOD  COMPREHENSIVE METABOLIC PANEL  CBC     EKG EKG independently reviewed interpreted by myself (ER attending) demonstrates:    RADIOLOGY Imaging independently reviewed and interpreted by myself demonstrates:  CT head without acute bleed CT abdomen pelvis  without obstruction or other acute abnormality  PROCEDURES:  Critical Care performed: No  Procedures   MEDICATIONS ORDERED IN ED: Medications  ondansetron (ZOFRAN) injection 4 mg (4 mg Intravenous Given 11/11/23 1836)  morphine (PF) 2 MG/ML injection 2 mg (2 mg Intravenous Given 11/11/23 1837)  iohexol (OMNIPAQUE) 300 MG/ML solution 100 mL (100 mLs Intravenous Contrast Given 11/11/23 1936)     IMPRESSION / MDM / ASSESSMENT AND PLAN / ED COURSE  I reviewed the triage vital signs and the nursing notes.  Differential diagnosis includes, but is not limited to, viral illness, diverticulitis, colitis, other acute intra-abdominal process, intracranial bleed, benign headache  Patient's presentation is most consistent with acute presentation with potential  threat to life or bodily function.  73 year old female presenting with headache and abdominal pain with associated vomiting.  Clinical history suggestive of viral syndrome, but localized abdominal pain as well as repeated episodes of vomiting, labs obtained which were overall reassuring.  CT head and CT abdomen pelvis also without acute findings.  Patient treated symptomatically.  On reevaluation feels much improved.  Is comfortable with discharge home.  Will DC with prescription for Zofran.  Strict return precautions provided.      FINAL CLINICAL IMPRESSION(S) / ED DIAGNOSES   Final diagnoses:  Nausea and vomiting, unspecified vomiting type  Left lower quadrant abdominal pain  Acute nonintractable headache, unspecified headache type     Rx / DC Orders   ED Discharge Orders           Ordered    ondansetron (ZOFRAN) 4 MG tablet  Every 6 hours PRN        11/11/23 2122             Note:  This document was prepared using Dragon voice recognition software and may include unintentional dictation errors.   Trinna Post, MD 11/11/23 2123

## 2023-11-11 NOTE — Telephone Encounter (Signed)
Copied from CRM [id number]. Topic: Clinical - Red Word Triage >> Nov 11, 2023  9:08 AM Prudencio Pair wrote: Red Word that prompted transfer to Nurse Triage: Patient states she's having headaches & stomach aches. Patient has been vomiting as well. Wants to be seen today if possible. Reason for Disposition  MILD vomiting with diarrhea    No diarrhea  Answer Assessment - Initial Assessment Questions 1. VOMITING SEVERITY: "How many times have you vomited in the past 24 hours?"     - MILD:  1 - 2 times/day    - MODERATE: 3 - 5 times/day, decreased oral intake without significant weight loss or symptoms of dehydration    - SEVERE: 6 or more times/day, vomits everything or nearly everything, with significant weight loss, symptoms of dehydration      Vomiting, stomach aches and headaches. 2. ONSET: "When did the vomiting begin?"      Yesterday. 3. FLUIDS: "What fluids or food have you vomited up today?" "Have you been able to keep any fluids down?"     I am drinking water.   I ate some breakfast this morning.   It stayed down.  4. ABDOMEN PAIN: "Are your having any abdomen pain?" If Yes : "How bad is it and what does it feel like?" (e.g., crampy, dull, intermittent, constant)      Stomach hurt yesterday in the middle.   Not hurting the morning.    5. DIARRHEA: "Is there any diarrhea?" If Yes, ask: "How many times today?"      No 6. CONTACTS: "Is there anyone else in the family with the same symptoms?"      No 7. CAUSE: "What do you think is causing your vomiting?"     I was just feeling bad.    8. HYDRATION STATUS: "Any signs of dehydration?" (e.g., dry mouth [not only dry lips], too weak to stand) "When did you last urinate?"     I'm regular urination.  9. OTHER SYMPTOMS: "Do you have any other symptoms?" (e.g., fever, headache, vertigo, vomiting blood or coffee grounds, recent head injury)     Headaches started yesterday.    It was worse yesterday.    I need to see the dr for headaches.     10.  PREGNANCY: "Is there any chance you are pregnant?" "When was your last menstrual period?"       N/A due toage  Protocols used: Vomiting-A-AH  Chief Complaint: Pt wanted to be seen today for headache.   No appts available with any of the providers.    She refused other offers. Symptoms: Yesterday she vomited once and had a headache.   Denied headache or vomiting or stomach pain this morning.    (She was vague in her answers).  Frequency: Just yesterday.   Today has a headache but feels fine otherwise Pertinent Negatives: Patient denies other symtpoms, Disposition: [] ED /[] Urgent Care (no appt availability in office) / [] Appointment(In office/virtual)/ []  [facility]/ [x] Home Care/ [] Refused Recommended Disposition /[] [facility]/ []  Follow-up with PCP Additional Notes: Went over home advice if she starts vomiting again.   She is taking Tylenol for the headache.

## 2023-11-11 NOTE — Telephone Encounter (Signed)
 Noted

## 2023-11-14 ENCOUNTER — Ambulatory Visit: Payer: Self-pay | Admitting: Family

## 2023-11-14 NOTE — Telephone Encounter (Signed)
 Okay--I will check her tomorrow at the appointment

## 2023-11-14 NOTE — Telephone Encounter (Signed)
    Chief Complaint: Pt. Seen in ED last with with vomiting. Has continued abdominal pain. No vomiting or diarrhea. Pain 5/10. Left side of belly button. Symptoms: Above  Frequency: Last week Pertinent Negatives: Patient denies fever Disposition: [] ED /[] Urgent Care (no appt availability in office) / [x] Appointment(In office/virtual)/ []  [facility]/ [] Home Care/ [] Refused Recommended Disposition /[] [facility]/ []  Follow-up with PCP Additional Notes: Pt. Agrees with appointment.  Reason for Disposition  [1] MODERATE pain (e.g., interferes with normal activities) AND [2] pain comes and goes (cramps) AND [3] present > 24 hours  (Exception: Pain with Vomiting or Diarrhea - see that Guideline.)  Answer Assessment - Initial Assessment Questions 1. LOCATION: "Where does it hurt?"      Left middle 2. RADIATION: "Does the pain shoot anywhere else?" (e.g., chest, back)     No 3. ONSET: "When did the pain begin?" (e.g., minutes, hours or days ago)      Last Thursday 4. SUDDEN: "Gradual or sudden onset?"     Gradual 5. PATTERN "Does the pain come and go, or is it constant?"    - If it comes and goes: "How long does it last?" "Do you have pain now?"     (Note: Comes and goes means the pain is intermittent. It goes away completely between bouts.)    - If constant: "Is it getting better, staying the same, or getting worse?"      (Note: Constant means the pain never goes away completely; most serious pain is constant and gets worse.)      Constant 6. SEVERITY: "How bad is the pain?"  (e.g., Scale 1-10; mild, moderate, or severe)    - MILD (1-3): Doesn't interfere with normal activities, abdomen soft and not tender to touch.     - MODERATE (4-7): Interferes with normal activities or awakens from sleep, abdomen tender to touch.     - SEVERE (8-10): Excruciating pain, doubled over, unable to do any normal activities.       Now - 5 7. RECURRENT SYMPTOM: "Have you ever had this  type of stomach pain before?" If Yes, ask: "When was the last time?" and "What happened that time?"      No 8. CAUSE: "What do you think is causing the stomach pain?"     Unsure 9. RELIEVING/AGGRAVATING FACTORS: "What makes it better or worse?" (e.g., antacids, bending or twisting motion, bowel movement)     No 10. OTHER SYMPTOMS: "Do you have any other symptoms?" (e.g., back pain, diarrhea, fever, urination pain, vomiting)       No 11. PREGNANCY: "Is there any chance you are pregnant?" "When was your last menstrual period?"       No  Protocols used: Abdominal Pain - [facility]

## 2023-11-15 ENCOUNTER — Encounter: Payer: Self-pay | Admitting: Internal Medicine

## 2023-11-15 ENCOUNTER — Ambulatory Visit (INDEPENDENT_AMBULATORY_CARE_PROVIDER_SITE_OTHER): Payer: Medicare HMO | Admitting: Internal Medicine

## 2023-11-15 VITALS — BP 92/70 | HR 88 | Temp 98.3°F | Ht 63.0 in | Wt 113.0 lb

## 2023-11-15 DIAGNOSIS — R051 Acute cough: Secondary | ICD-10-CM

## 2023-11-15 DIAGNOSIS — R109 Unspecified abdominal pain: Secondary | ICD-10-CM | POA: Diagnosis not present

## 2023-11-15 DIAGNOSIS — R059 Cough, unspecified: Secondary | ICD-10-CM | POA: Insufficient documentation

## 2023-11-15 MED ORDER — BENZONATATE 200 MG PO CAPS: 200.0000 mg | ORAL_CAPSULE | Freq: Three times a day (TID) | ORAL | 0 refills | Status: DC | PRN

## 2023-11-15 NOTE — Progress Notes (Signed)
 Subjective:    Patient ID: Madison Miller, female    DOB: 1951/03/14, 73 y.o.   MRN: [id number]  HPI Here due to lower abdominal pain  Did get better from spell 2 years ago  Started last week with pain --feels it is near stomach Also with headache No fever Some N/V Eating okay--not a big appetite Some pain after eating  CT scan a few months ago--then repeated at ER 4 days ago  Bowels usually move daily--but hasn't gone in several days  Current Outpatient Medications on File Prior to Visit  Medication Sig Dispense Refill   acetaminophen (TYLENOL) 500 MG tablet Take 500 mg by mouth as needed for moderate pain or headache.      ciclopirox (PENLAC) 8 % solution Apply topically at bedtime. Apply over nail and surrounding skin. Apply daily over previous coat. After seven (7) days, may remove with alcohol and continue cycle. 6.6 mL 0   famotidine (PEPCID) 20 MG tablet TAKE 1 TABLET BY MOUTH TWICE A DAY 180 tablet 1   furosemide (LASIX) 20 MG tablet Take 1 tablet (20 mg total) by mouth daily. 90 tablet 0   lisinopril (ZESTRIL) 5 MG tablet Take 1 tablet (5 mg total) by mouth daily. 90 tablet 0   methocarbamol (ROBAXIN) 500 MG tablet Take 1 tablet (500 mg total) by mouth at bedtime as needed for muscle spasms (back pain). 20 tablet 1   metoprolol succinate (TOPROL-XL) 25 MG 24 hr tablet Take 0.5 tablets (12.5 mg total) by mouth daily. 90 tablet 0   ondansetron (ZOFRAN) 4 MG tablet Take 1 tablet (4 mg total) by mouth every 6 (six) hours as needed for up to 7 days for nausea or vomiting. 20 tablet 0   potassium chloride (KLOR-CON) 10 MEQ tablet TAKE 1 TABLET BY MOUTH EVERY DAY 90 tablet 3   No current facility-administered medications on file prior to visit.    Allergies  Allergen Reactions   Lactose Other (See Comments)    Pain,bloating   Lactose Intolerance (Gi) Other (See Comments)    Pain,bloating    Past Medical History:  Diagnosis Date   CHF (congestive heart failure)  (HCC)    GERD (gastroesophageal reflux disease)    HTN (hypertension)    Hyperparathyroidism    Migraine, unspecified, with intractable migraine, so stated, without mention of status migrainosus    Right bundle branch block and left anterior fascicular block    SVT (supraventricular tachycardia) (HCC)     Past Surgical History:  Procedure Laterality Date   LUMBAR LAMINECTOMY  2010   Dr. Phoebe Perch   PARATHYROIDECTOMY  2009    Family History  Problem Relation Age of Onset   Other Mother        barin tumor   Stroke Father    Sudden death Daughter    Colon cancer Neg Hx    Rectal cancer Neg Hx    Stomach cancer Neg Hx     Social History   Socioeconomic History   Marital status: Widowed    Spouse name: Not on file   Number of children: 6   Years of education: Not on file   Highest education level: Not on file  Occupational History   Not on file  Tobacco Use   Smoking status: Never   Smokeless tobacco: Never  Vaping Use   Vaping status: Never Used  Substance and Sexual Activity   Alcohol use: No    Alcohol/week: 0.0 standard drinks of alcohol  Drug use: No   Sexual activity: Never  Other Topics Concern   Not on file  Social History Narrative   Not on file   Social Drivers of Health   Financial Resource Strain: Medium Risk (09/20/2023)   Received from [facility]   Overall Financial Resource Strain (CARDIA)    Difficulty of Paying Living Expenses: Somewhat hard  Food Insecurity: No Food Insecurity (09/20/2023)   Received from [facility]   Hunger Vital Sign    Worried About Running Out of Food in the Last Year: Never true    Ran Out of Food in the Last Year: Never true  Transportation Needs: No Transportation Needs (09/20/2023)   Received from [facility] - Transportation    In the past 12 months, has lack of transportation kept you from medical appointments or from getting medications?: No     Lack of Transportation (Non-Medical): No  Physical Activity: Unknown (12/15/2022)   Exercise Vital Sign    Days of Exercise per Week: 3 days    Minutes of Exercise per Session: Not on file  Stress: No Stress Concern Present (12/15/2022)   [employer] of Occupational Health - Occupational Stress Questionnaire    Feeling of Stress : Not at all  Social Connections: Moderately Isolated (12/15/2022)   Social Connection and Isolation Panel [NHANES]    Frequency of Communication with Friends and Family: More than three times a week    Frequency of Social Gatherings with Friends and Family: More than three times a week    Attends Religious Services: More than 4 times per year    Active Member of [employer] or Organizations: No    Attends Banker Meetings: Never    Marital Status: Widowed  Intimate Partner Violence: Not At Risk (12/15/2022)   Humiliation, Afraid, Rape, and Kick questionnaire    Fear of Current or Ex-Partner: No    Emotionally Abused: No    Physically Abused: No    Sexually Abused: No   Review of Systems Has lost a few pounds     Objective:   Physical Exam Constitutional:      Appearance: She is well-developed.  Cardiovascular:     Rate and Rhythm: Normal rate and regular rhythm.     Heart sounds: No murmur heard.    No gallop.  Pulmonary:     Effort: Pulmonary effort is normal.     Breath sounds: Normal breath sounds. No wheezing or rales.  Abdominal:     General: Bowel sounds are normal. There is no distension.     Palpations: Abdomen is soft.     Tenderness: There is no abdominal tenderness. There is no guarding or rebound.  Musculoskeletal:     Cervical back: Neck supple.  Lymphadenopathy:     Cervical: No cervical adenopathy.  Neurological:     Mental Status: She is alert.            Assessment & Plan:

## 2023-11-15 NOTE — Assessment & Plan Note (Signed)
 Since last week Will give benzonatate for prn

## 2023-11-15 NOTE — Assessment & Plan Note (Signed)
 No worrisome findings on exam or CT scan Will set up with GI--probably should have endoscopy

## 2023-11-15 NOTE — Addendum Note (Signed)
 Addended by: Tillman Abide I on: 11/15/2023 11:13 AM   Modules accepted: Orders

## 2023-11-15 NOTE — Patient Instructions (Signed)
 Please try miralax--1 capful daily -- or 2 senna-s daily ---till your bowels are moving normally again

## 2023-11-17 ENCOUNTER — Ambulatory Visit (INDEPENDENT_AMBULATORY_CARE_PROVIDER_SITE_OTHER): Payer: Medicare HMO | Admitting: Family

## 2023-11-17 VITALS — BP 98/72 | HR 76 | Temp 98.6°F | Ht 63.0 in | Wt 116.2 lb

## 2023-11-17 DIAGNOSIS — N200 Calculus of kidney: Secondary | ICD-10-CM | POA: Diagnosis not present

## 2023-11-17 DIAGNOSIS — I700 Atherosclerosis of aorta: Secondary | ICD-10-CM | POA: Insufficient documentation

## 2023-11-17 DIAGNOSIS — K429 Umbilical hernia without obstruction or gangrene: Secondary | ICD-10-CM | POA: Diagnosis not present

## 2023-11-17 DIAGNOSIS — R1012 Left upper quadrant pain: Secondary | ICD-10-CM | POA: Insufficient documentation

## 2023-11-17 DIAGNOSIS — R12 Heartburn: Secondary | ICD-10-CM

## 2023-11-17 DIAGNOSIS — K8020 Calculus of gallbladder without cholecystitis without obstruction: Secondary | ICD-10-CM | POA: Insufficient documentation

## 2023-11-17 DIAGNOSIS — R300 Dysuria: Secondary | ICD-10-CM

## 2023-11-17 LAB — CBC WITH DIFFERENTIAL/PLATELET
Basophils Absolute: 0 10*3/uL (ref 0.0–0.1)
Basophils Relative: 0.7 % (ref 0.0–3.0)
Eosinophils Absolute: 0.1 10*3/uL (ref 0.0–0.7)
Eosinophils Relative: 1.3 % (ref 0.0–5.0)
HCT: 41.9 % (ref 36.0–46.0)
Hemoglobin: 13.6 g/dL (ref 12.0–15.0)
Lymphocytes Relative: 24.4 % (ref 12.0–46.0)
Lymphs Abs: 1.6 10*3/uL (ref 0.7–4.0)
MCHC: 32.4 g/dL (ref 30.0–36.0)
MCV: 92.8 fL (ref 78.0–100.0)
Monocytes Absolute: 0.8 10*3/uL (ref 0.1–1.0)
Monocytes Relative: 11.9 % (ref 3.0–12.0)
Neutro Abs: 4.1 10*3/uL (ref 1.4–7.7)
Neutrophils Relative %: 61.7 % (ref 43.0–77.0)
Platelets: 246 10*3/uL (ref 150.0–400.0)
RBC: 4.51 Mil/uL (ref 3.87–5.11)
RDW: 14.2 % (ref 11.5–15.5)
WBC: 6.7 10*3/uL (ref 4.0–10.5)

## 2023-11-17 LAB — COMPREHENSIVE METABOLIC PANEL
ALT: 8 U/L (ref 0–35)
AST: 15 U/L (ref 0–37)
Albumin: 4.6 g/dL (ref 3.5–5.2)
Alkaline Phosphatase: 44 U/L (ref 39–117)
BUN: 26 mg/dL — ABNORMAL HIGH (ref 6–23)
CO2: 35 meq/L — ABNORMAL HIGH (ref 19–32)
Calcium: 9.9 mg/dL (ref 8.4–10.5)
Chloride: 101 meq/L (ref 96–112)
Creatinine, Ser: 1.09 mg/dL (ref 0.40–1.20)
GFR: 50.87 mL/min — ABNORMAL LOW (ref >60.00)
Glucose, Bld: 92 mg/dL (ref 70–99)
Potassium: 3.9 meq/L (ref 3.5–5.1)
Sodium: 142 meq/L (ref 135–145)
Total Bilirubin: 0.6 mg/dL (ref 0.2–1.2)
Total Protein: 7.8 g/dL (ref 6.0–8.3)

## 2023-11-17 LAB — LIPASE: Lipase: 53 U/L (ref 11.0–59.0)

## 2023-11-17 LAB — AMYLASE: Amylase: 65 U/L (ref 27–131)

## 2023-11-17 MED ORDER — OMEPRAZOLE 20 MG PO CPDR: 20.0000 mg | DELAYED_RELEASE_CAPSULE | Freq: Every day | ORAL | 3 refills | Status: DC

## 2023-11-17 NOTE — Assessment & Plan Note (Signed)
 Pain on palpation/able to reduce however with residual pain afterwards.  Discussed red flag symptoms.  Referral to general surgery to evaluate further.  Try to avoid straining, handout given to pt as well.

## 2023-11-17 NOTE — Assessment & Plan Note (Signed)
 Reviewed ct abd pelvis Suspect radiation of pain from umbilical hernia however ordering amylase lipase pending results.

## 2023-11-17 NOTE — Assessment & Plan Note (Signed)
 Reviewed on ct scan. Non obstructing.

## 2023-11-17 NOTE — Progress Notes (Signed)
 Established Patient Office Visit  Subjective:   Patient ID: Madison Miller, female    DOB: 09/12/1951  Age: 73 y.o. MRN: [id number]  CC:  Chief Complaint  Patient presents with   Abdominal Pain   Headache    HPI: Madison Miller is a 72 y.o. female presenting on 11/17/2023 for Abdominal Pain and Headache   Seen 2/21 at urgent care for c/o abdominal periumbilical pain 5/10 at the time and an accompanying headache, was told to go to ER   Went to ER that same day [facility]. Had had one episode n/v day prior. Blood pressure was elevated at visit 151/100 Covid flu and rsv negative.  U/a with ketones, protein, hematuria.  Ct abd pelvis with no acute findings, small gallstones. No pancreatic changes. Bil kidney stones right 9 mm left up to 17 mm. No obstruction, small fat containing umbilical hernia. Ct head without acute findings  Discharged with rx zofran.  She states today she is still not feeling well, has not thrown up againest since. Only very very slight abdominal pain but is still with a slight headache. Denies sinus pressure or nasal congestion. No diarrhea or constipation, was having constipation initially at start of symptoms but has since taken a laxative with a stool. No fever no chills. Denies urinary symptoms no frequency urgency or dysuria. Not really experiencing any back pain. The cough is only occasionally productive. The cough is very intermittent. Colonoscopy 05/05/2010 due for repeat 2021 however pt did not repeat. She states she is eating ok does have appetite for the most part. No vaginal discharge.   Her blood pressure is 98/72 today.       ROS: Negative unless specifically indicated above in HPI.   Relevant past medical history reviewed and updated as indicated.   Allergies and medications reviewed and updated.   Current Outpatient Medications:    acetaminophen (TYLENOL) 500 MG tablet, Take 500 mg by mouth as needed for moderate pain or headache. ,  Disp: , Rfl:    ciclopirox (PENLAC) 8 % solution, Apply topically at bedtime. Apply over nail and surrounding skin. Apply daily over previous coat. After seven (7) days, may remove with alcohol and continue cycle., Disp: 6.6 mL, Rfl: 0   famotidine (PEPCID) 20 MG tablet, TAKE 1 TABLET BY MOUTH TWICE A DAY, Disp: 180 tablet, Rfl: 1   furosemide (LASIX) 20 MG tablet, Take 1 tablet (20 mg total) by mouth daily., Disp: 90 tablet, Rfl: 0   lisinopril (ZESTRIL) 5 MG tablet, Take 1 tablet (5 mg total) by mouth daily., Disp: 90 tablet, Rfl: 0   methocarbamol (ROBAXIN) 500 MG tablet, Take 1 tablet (500 mg total) by mouth at bedtime as needed for muscle spasms (back pain)., Disp: 20 tablet, Rfl: 1   metoprolol succinate (TOPROL-XL) 25 MG 24 hr tablet, Take 0.5 tablets (12.5 mg total) by mouth daily., Disp: 90 tablet, Rfl: 0   omeprazole (PRILOSEC) 20 MG capsule, Take 1 capsule (20 mg total) by mouth daily., Disp: 30 capsule, Rfl: 3   ondansetron (ZOFRAN) 4 MG tablet, Take 1 tablet (4 mg total) by mouth every 6 (six) hours as needed for up to 7 days for nausea or vomiting., Disp: 20 tablet, Rfl: 0   potassium chloride (KLOR-CON) 10 MEQ tablet, TAKE 1 TABLET BY MOUTH EVERY DAY, Disp: 90 tablet, Rfl: 3  Allergies  Allergen Reactions   Lactose Other (See Comments)    Pain,bloating   Lactose Intolerance (Gi) Other (See Comments)  Pain,bloating    Objective:   BP 98/72 (BP Location: Right Arm, Patient Position: Sitting, Cuff Size: Normal)   Pulse 76   Temp 98.6 F (37 C) (Temporal)   Ht 5\' 3"  (1.6 m)   Wt 116 lb 3.2 oz (52.7 kg)   SpO2 96%   BMI 20.58 kg/m    Physical Exam Constitutional:      General: She is not in acute distress.    Appearance: Normal appearance. She is normal weight. She is not ill-appearing, toxic-appearing or diaphoretic.  HENT:     Head: Normocephalic.     Right Ear: Tympanic membrane normal.     Left Ear: Tympanic membrane normal.     Nose: Nose normal.      Mouth/Throat:     Mouth: Mucous membranes are dry.     Pharynx: No oropharyngeal exudate or posterior oropharyngeal erythema.  Eyes:     Extraocular Movements: Extraocular movements intact.     Pupils: Pupils are equal, round, and reactive to light.  Cardiovascular:     Rate and Rhythm: Normal rate and regular rhythm.     Pulses: Normal pulses.     Heart sounds: Normal heart sounds.  Pulmonary:     Effort: Pulmonary effort is normal.     Breath sounds: Normal breath sounds.  Abdominal:     General: Bowel sounds are normal.     Tenderness: There is abdominal tenderness in the periumbilical area, left upper quadrant and left lower quadrant. There is no guarding.     Hernia: A hernia is present. Hernia is present in the umbilical area (reducible however painful).  Musculoskeletal:     Cervical back: Normal range of motion.  Neurological:     General: No focal deficit present.     Mental Status: She is alert and oriented to person, place, and time. Mental status is at baseline.  Psychiatric:        Mood and Affect: Mood normal.        Behavior: Behavior normal.        Thought Content: Thought content normal.        Judgment: Judgment normal.     Assessment & Plan:  Dysuria -     Urinalysis w microscopic + reflex cultur  Aortic atherosclerosis (HCC)  Nephrolithiasis Assessment & Plan: Reviewed on ct scan. Non obstructing.   Calculus of gallbladder without cholecystitis without obstruction  Umbilical hernia without obstruction and without gangrene Assessment & Plan: Pain on palpation/able to reduce however with residual pain afterwards.  Discussed red flag symptoms.  Referral to general surgery to evaluate further.  Try to avoid straining, handout given to pt as well.    Orders: -     Ambulatory referral to General Surgery  Heartburn -     Omeprazole; Take 1 capsule (20 mg total) by mouth daily.  Dispense: 30 capsule; Refill: 3  Abdominal pain, left upper  quadrant Assessment & Plan: Reviewed ct abd pelvis Suspect radiation of pain from umbilical hernia however ordering amylase lipase pending results.    Orders: -     Amylase -     Lipase -     CBC with Differential/Platelet -     Comprehensive metabolic panel     Follow up plan: Return if symptoms worsen or fail to improve.  Mort Sawyers, FNP

## 2023-11-18 LAB — URINALYSIS W MICROSCOPIC + REFLEX CULTURE
Bacteria, UA: NONE SEEN /[HPF]
Bilirubin Urine: NEGATIVE
Glucose, UA: NEGATIVE
Hyaline Cast: NONE SEEN /[LPF]
Ketones, ur: NEGATIVE
Leukocyte Esterase: NEGATIVE
Nitrites, Initial: NEGATIVE
Protein, ur: NEGATIVE
Specific Gravity, Urine: 1.014 (ref 1.001–1.035)
Squamous Epithelial / HPF: NONE SEEN /[HPF] (ref <=5)
pH: 5.5 (ref 5.0–8.0)

## 2023-11-18 LAB — NO CULTURE INDICATED

## 2023-11-18 NOTE — Progress Notes (Signed)
 Urine negative for infection Blood again seen in the urine although this looks to be be a chronic problem for the patient. Has she ever seen urology for chronic blood in urine? A lot of times this is a completely benign finding. Kidney function stable. White blood cells normal range

## 2023-11-21 ENCOUNTER — Ambulatory Visit (INDEPENDENT_AMBULATORY_CARE_PROVIDER_SITE_OTHER): Payer: Medicare HMO | Admitting: Surgery

## 2023-11-21 ENCOUNTER — Encounter: Payer: Self-pay | Admitting: Surgery

## 2023-11-21 ENCOUNTER — Other Ambulatory Visit: Payer: Self-pay | Admitting: Family

## 2023-11-21 VITALS — BP 102/66 | HR 75 | Temp 98.1°F | Ht 63.0 in | Wt 116.0 lb

## 2023-11-21 DIAGNOSIS — K429 Umbilical hernia without obstruction or gangrene: Secondary | ICD-10-CM

## 2023-11-21 DIAGNOSIS — R3129 Other microscopic hematuria: Secondary | ICD-10-CM

## 2023-11-21 NOTE — Progress Notes (Signed)
 Patient ID: Madison Miller, female   DOB: Jul 21, 1951, 73 y.o.   MRN: [id number]  HPI Madison Miller is a 73 y.o. female seen consultation at the request of Mrs Alfonse Alpers. NP.  [facility] endorses intermittent periumbilical pain, moderate, sharp,worsening w valsalva. It has been present for a while/ No N/V.  CT pers reviewed showing uncomplicated UH She also c/o intermittent headaches. HX CHF, SVT, seems to be pretty functional. She is able to drive and is independent. CBC and CMP is nml, lipase and amylase nml   HPI  Past Medical History:  Diagnosis Date   CHF (congestive heart failure) (HCC)    GERD (gastroesophageal reflux disease)    HTN (hypertension)    Hyperparathyroidism    Migraine, unspecified, with intractable migraine, so stated, without mention of status migrainosus    Right bundle branch block and left anterior fascicular block    SVT (supraventricular tachycardia) (HCC)     Past Surgical History:  Procedure Laterality Date   LUMBAR LAMINECTOMY  2010   Dr. Phoebe Perch   PARATHYROIDECTOMY  2009    Family History  Problem Relation Age of Onset   Other Mother        barin tumor   Stroke Father    Sudden death Daughter    Colon cancer Neg Hx    Rectal cancer Neg Hx    Stomach cancer Neg Hx     Social History Social History   Tobacco Use   Smoking status: Never    Passive exposure: Never   Smokeless tobacco: Never  Vaping Use   Vaping status: Never Used  Substance Use Topics   Alcohol use: No    Alcohol/week: 0.0 standard drinks of alcohol   Drug use: No    Allergies  Allergen Reactions   Lactose Other (See Comments)    Pain,bloating   Lactose Intolerance (Gi) Other (See Comments)    Pain,bloating    Current Outpatient Medications  Medication Sig Dispense Refill   acetaminophen (TYLENOL) 500 MG tablet Take 500 mg by mouth as needed for moderate pain or headache.      ciclopirox (PENLAC) 8 % solution Apply topically at bedtime. Apply over nail and  surrounding skin. Apply daily over previous coat. After seven (7) days, may remove with alcohol and continue cycle. 6.6 mL 0   famotidine (PEPCID) 20 MG tablet TAKE 1 TABLET BY MOUTH TWICE A DAY 180 tablet 1   furosemide (LASIX) 20 MG tablet Take 1 tablet (20 mg total) by mouth daily. 90 tablet 0   lisinopril (ZESTRIL) 5 MG tablet Take 1 tablet (5 mg total) by mouth daily. 90 tablet 0   methocarbamol (ROBAXIN) 500 MG tablet Take 1 tablet (500 mg total) by mouth at bedtime as needed for muscle spasms (back pain). 20 tablet 1   metoprolol succinate (TOPROL-XL) 25 MG 24 hr tablet Take 0.5 tablets (12.5 mg total) by mouth daily. 90 tablet 0   omeprazole (PRILOSEC) 20 MG capsule Take 1 capsule (20 mg total) by mouth daily. 30 capsule 3   potassium chloride (KLOR-CON) 10 MEQ tablet TAKE 1 TABLET BY MOUTH EVERY DAY 90 tablet 3   No current facility-administered medications for this visit.     Review of Systems Full ROS  was asked and was negative except for the information on the HPI  Physical Exam Blood pressure 102/66, pulse 75, temperature 98.1 F (36.7 C), height 5\' 3"  (1.6 m), weight 116 lb (52.6 kg), SpO2 96%. CONSTITUTIONAL: NAD. EYES: Pupils  are equal, round, Sclera are non-icteric. EARS, NOSE, MOUTH AND THROAT: The oropharynx is clear. The oral mucosa is pink and moist. Hearing is intact to voice. LYMPH NODES:  Lymph nodes in the neck are normal. RESPIRATORY:  Lungs are clear. There is normal respiratory effort, with equal breath sounds bilaterally, and without pathologic use of accessory muscles. CARDIOVASCULAR: Heart is regular without murmurs, gallops, or rubs. GI: The abdomen is  soft, tender 3 cm umbilical hernia seems to be reducible but exam limits assessment. There is no hepatosplenomegaly. There are normal bowel sounds in all quadrants. GU: Rectal deferred.   MUSCULOSKELETAL: Normal muscle strength and tone. No cyanosis or edema.   SKIN: Turgor is good and there are no  pathologic skin lesions or ulcers. NEUROLOGIC: Motor and sensation is grossly normal. Cranial nerves are grossly intact. PSYCH:  Oriented to person, place and time. Affect is normal.  Data Reviewed  I have personally reviewed the patient's imaging, laboratory findings and medical records.    Assessment/Plan 73 year old with a symptomatic umbilical hernia.  Discussed with the patient detail about her disease process.  Given that it is symptomatic I definitely recommend repair.  I discussed with her about what surgery will entail.  The risks, the benefits and possible complications.  She was open for a 'pill' that we will fix the hernia.  Explained to her that this is needed conical process and typically that it requires surgical invention.  Abdomen is ready to move forward with any type of intervention.  Discussed with her potential signs and symptoms of incarceration or strangulation.  Did also offer her to follow-up with me 1 month encouraged her to think about potential surgical invention. She will call us back when she is ready  Please note that I have spent 45 minutes on this encounter including personally studies, records, coordinating care, placing orders, counseling the patient and performing documentation.   Sterling Big, MD FACS General Surgeon 11/21/2023, 11:10 AM

## 2023-11-21 NOTE — Patient Instructions (Signed)
 We spoke with you today about having your Umbilical Hernia be repaired. If you decide to have surgery this would be done at [facility] by Dr Everlene Farrier.   Call us if you decide to have this surgery.   You will need to arrange to be off work for 1-2 weeks but will have to have a lifting restriction of no more than 15 lbs for 6 weeks following your surgery. If you have FMLA or disability paperwork that needs filled out you may drop this off at our office or this can be faxed to [phone].     Umbilical Hernia, Adult A hernia is a bulge of tissue that pushes through an opening between muscles. An umbilical hernia happens in the abdomen, near the belly button (umbilicus). The hernia may contain tissues from the small intestine, large intestine, or fatty tissue covering the intestines (omentum). Umbilical hernias in adults tend to get worse over time, and they require surgical treatment. There are several types of umbilical hernias. You may have: A hernia located just above or below the umbilicus (indirect hernia). This is the most common type of umbilical hernia in adults. A hernia that forms through an opening formed by the umbilicus (direct hernia). A hernia that comes and goes (reducible hernia). A reducible hernia may be visible only when you strain, lift something heavy, or cough. This type of hernia can be pushed back into the abdomen (reduced). A hernia that traps abdominal tissue inside the hernia (incarcerated hernia). This type of hernia cannot be reduced. A hernia that cuts off blood flow to the tissues inside the hernia (strangulated hernia). The tissues can start to die if this happens. This type of hernia requires emergency treatment.  What are the causes? An umbilical hernia happens when tissue inside the abdomen presses on a weak area of the abdominal muscles. What increases the risk? You may have a greater risk of this condition if you: Are obese. Have had several  pregnancies. Have a buildup of fluid inside your abdomen (ascites). Have had surgery that weakens the abdominal muscles.  What are the signs or symptoms? The main symptom of this condition is a painless bulge at or near the belly button. A reducible hernia may be visible only when you strain, lift something heavy, or cough. Other symptoms may include: Dull pain. A feeling of pressure.  Symptoms of a strangulated hernia may include: Pain that gets increasingly worse. Nausea and vomiting. Pain when pressing on the hernia. Skin over the hernia becoming red or purple. Constipation. Blood in the stool.  How is this diagnosed? This condition may be diagnosed based on: A physical exam. You may be asked to cough or strain while standing. These actions increase the pressure inside your abdomen and force the hernia through the opening in your muscles. Your health care provider may try to reduce the hernia by pressing on it. Your symptoms and medical history.  How is this treated? Surgery is the only treatment for an umbilical hernia. Surgery for a strangulated hernia is done as soon as possible. If you have a small hernia that is not incarcerated, you may need to lose weight before having surgery. Follow these instructions at home: Lose weight, if told by your health care provider. Do not try to push the hernia back in. Watch your hernia for any changes in color or size. Tell your health care provider if any changes occur. You may need to avoid activities that increase pressure on your hernia.  Do not lift anything that is heavier than 10 lb (4.5 kg) until your health care provider says that this is safe. Take over-the-counter and prescription medicines only as told by your health care provider. Keep all follow-up visits as told by your health care provider. This is important. Contact a health care provider if: Your hernia gets larger. Your hernia becomes painful. Get help right away if: You  develop sudden, severe pain near the area of your hernia. You have pain as well as nausea or vomiting. You have pain and the skin over your hernia changes color. You develop a fever. This information is not intended to replace advice given to you by your health care provider. Make sure you discuss any questions you have with your health care provider. Document Released: 02/06/2016 Document Revised: 05/09/2016 Document Reviewed: 02/06/2016 Elsevier Interactive Patient Education  2018 [employer]  Umbilical Hernia, Adult  A hernia is a lump of tissue that pushes through an opening in the muscles. An umbilical hernia happens in the belly, near the belly button. The hernia may contain tissues from the small or large intestine. It may also have fatty tissue that covers the intestines. Umbilical hernias in adults may get worse over time. They need to be treated with surgery. There are several types of umbilical hernias. They include: Indirect hernia. This occurs just above or below the belly button. It's the most common type of umbilical hernia in adults. Direct hernia. This type occurs in an opening that's formed by the belly button. Reducible hernia. This hernia comes and goes. You may see it only when you strain, cough, or lift something heavy. This type of hernia can be pushed back into the belly (reduced). Incarcerated hernia. This traps the hernia in the wall of the belly. This type of hernia can't be pushed back into the belly. It can cause a strangulated hernia. Strangulated hernia. This hernia cuts off blood flow to the tissues inside the hernia. The tissues can die if this happens. This type of hernia must be treated right away. What are the causes? An umbilical hernia happens when tissue inside the belly pushes through an opening in the muscles of the belly. What increases the risk? You're more likely to get this hernia if: You strain while lifting or pushing heavy objects. You've had  several pregnancies. You have a condition that puts pressure on your belly, and you've had it for a long time. These include: Obesity. A buildup of fluid inside your belly. Vomiting or coughing all the time. Trouble pooping (constipation). You've had surgery that weakened the muscles in the belly. What are the signs or symptoms? The main symptom of this condition is a bulge at the belly button or near it. The bulge does not cause pain. Other symptoms depend on the type of hernia you have. A reducible hernia may be seen only when you strain, cough, or lift something heavy. Other symptoms may include: Dull pain. A feeling of pressure. An incarcerated hernia may cause very bad pain. Also, you may: Vomit or feel like you may vomit. Not be able to pass gas. A strangulated hernia may cause: Pain that gets worse and worse. Vomiting, or feeling like you may vomit. Pain when you press on the hernia. Change of color on the skin over the hernia. The skin may become red or purple. Trouble pooping. Blood in the poop. How is this diagnosed? This condition may be diagnosed based on: Your symptoms and medical history. A physical  exam. You may be asked to cough or strain while standing. These actions will put pressure inside your belly. The pressure can force the hernia through the opening in your muscles. Your health care provider may try to push the hernia back into your belly (reduce). How is this treated? Surgery is the only treatment for an umbilical hernia. Surgery for a strangulated hernia must be done right away. If you have a small hernia that's not incarcerated, you may need to lose weight before the surgery is done. Follow these instructions at home: Managing constipation You may need to take these actions to prevent trouble pooping. This will help to prevent straining. Drink enough fluid to keep your pee (urine) pale yellow. Take over-the-counter or prescription medicines. Eat foods  that are high in fiber, such as beans, whole grains, and fresh fruits and vegetables. Limit foods that are high in fat and sugars, such as fried or sweet foods. General instructions Do not try to push the hernia back in. Lose weight, if told by your provider. Watch your hernia for any changes in color or size. Tell your provider if any changes occur. You may need to avoid activities that put pressure on your hernia. You may have to avoid lifting. Ask your provider how much you can safely lift. Take over-the-counter and prescription medicines only as told by your provider. Contact a health care provider if: Your hernia gets larger or feels hard. Your hernia becomes painful. You get a fever or chills. Get help right away if: You get very bad pain near the area of the hernia, and the pain comes on suddenly. You have pain and you vomit or feel like you may vomit. The skin over your hernia changes color. These symptoms may be an emergency. Get help right away. Call 911. Do not wait to see if the symptoms go away. Do not drive yourself to the hospital. This information is not intended to replace advice given to you by your health care provider. Make sure you discuss any questions you have with your health care provider. Document Revised: 12/28/2022 Document Reviewed: 12/28/2022 Elsevier Patient Education  2024 [employer].

## 2023-11-30 NOTE — Progress Notes (Signed)
 Viral panel ordered due to pt multiple symptoms.

## 2023-12-09 ENCOUNTER — Other Ambulatory Visit: Payer: Self-pay | Admitting: Family

## 2023-12-09 DIAGNOSIS — I10 Essential (primary) hypertension: Secondary | ICD-10-CM

## 2023-12-12 ENCOUNTER — Other Ambulatory Visit: Payer: Self-pay | Admitting: Family

## 2023-12-12 DIAGNOSIS — I10 Essential (primary) hypertension: Secondary | ICD-10-CM

## 2023-12-12 NOTE — Telephone Encounter (Signed)
 Copied from CRM [id number]. Topic: Clinical - Medication Refill >> Dec 12, 2023  9:44 AM Florestine Avers wrote: Most Recent Primary Care Visit:  Provider: Mort Sawyers  Department: [facility]  Visit Type: ACUTE  Date: 11/17/2023  Medication: lisinopril (ZESTRIL) 5 MG tablet  Has the patient contacted their pharmacy? Yes (Agent: If no, request that the patient contact the pharmacy for the refill. If patient does not wish to contact the pharmacy document the reason why and proceed with request.) (Agent: If yes, when and what did the pharmacy advise?)  Is this the correct pharmacy for this prescription? Yes If no, delete pharmacy and type the correct one.  This is the patient's preferred pharmacy:  [facility] [phone] [facility] [place] - [address] [place] [address] Phone: [phone] Fax: [phone]   Has the prescription been filled recently? No  Is the patient out of the medication? Yes  Has the patient been seen for an appointment in the last year OR does the patient have an upcoming appointment? Yes  Can we respond through MyChart? No  Agent: Please be advised that Rx refills may take up to 3 business days. We ask that you follow-up with your pharmacy.

## 2024-01-02 ENCOUNTER — Ambulatory Visit: Admitting: Urology

## 2024-01-09 ENCOUNTER — Encounter: Payer: Self-pay | Admitting: Family

## 2024-01-09 ENCOUNTER — Encounter: Payer: Self-pay | Admitting: Surgery

## 2024-01-09 ENCOUNTER — Ambulatory Visit: Admitting: Surgery

## 2024-01-09 ENCOUNTER — Ambulatory Visit (INDEPENDENT_AMBULATORY_CARE_PROVIDER_SITE_OTHER): Admitting: Family

## 2024-01-09 ENCOUNTER — Telehealth: Payer: Self-pay | Admitting: Family

## 2024-01-09 VITALS — BP 122/81 | HR 79 | Temp 98.4°F | Ht 65.0 in | Wt 111.6 lb

## 2024-01-09 VITALS — BP 130/82 | HR 74 | Ht 63.0 in | Wt 112.4 lb

## 2024-01-09 DIAGNOSIS — I10 Essential (primary) hypertension: Secondary | ICD-10-CM | POA: Diagnosis not present

## 2024-01-09 DIAGNOSIS — K429 Umbilical hernia without obstruction or gangrene: Secondary | ICD-10-CM | POA: Diagnosis not present

## 2024-01-09 DIAGNOSIS — R300 Dysuria: Secondary | ICD-10-CM | POA: Diagnosis not present

## 2024-01-09 DIAGNOSIS — R1033 Periumbilical pain: Secondary | ICD-10-CM | POA: Insufficient documentation

## 2024-01-09 LAB — POC URINALSYSI DIPSTICK (AUTOMATED)
Bilirubin, UA: NEGATIVE
Blood, UA: POSITIVE — AB
Glucose, UA: NEGATIVE
Ketones, UA: POSITIVE — AB
Leukocytes, UA: NEGATIVE
Nitrite, UA: NEGATIVE
Protein, UA: POSITIVE — AB
Spec Grav, UA: 1.02 (ref 1.010–1.025)
Urobilinogen, UA: 0.2 U/dL
pH, UA: 5 (ref 5.0–8.0)

## 2024-01-09 MED ORDER — LISINOPRIL 10 MG PO TABS: 10.0000 mg | ORAL_TABLET | Freq: Every day | ORAL | 3 refills | Status: DC

## 2024-01-09 NOTE — Progress Notes (Signed)
 Established Patient Office Visit  Subjective:   Patient ID: Madison Miller, female    DOB: 01/24/1951  Age: 73 y.o. MRN: [id number]  CC:  Chief Complaint  Patient presents with   Abdominal Pain   Headache    HPI: Madison Miller is a 73 y.o. female presenting on 01/09/2024 for Abdominal Pain and Headache   Umbilical hernia, seen by general surgeon who recommended surgery as symptomatic however she didn't want to pursue this at that time.   She has stomach pain today, growling often and aching at times. Today not in as much pain as it was eased down a bit for today but she states this will come back. The pain has her catch her breath. Bowel movements are normal no blood in her stool. No sob or palpitations. Slight chest pain lasted in the afternoon, epigastric area, and she took metoprolol  (she thinks) and it went away. No heartburn. No chest pain since. No n/v   Did notice blood in her urine about one month ago but not since. Denies urinary urgency and or urinary frequency. She did notice some mild dysuria a few days ago but not since. She did feel some tightness in her right lower back that was gripping but last occurred over one week ago.   With headache, that is ongoing x the past three days.  At home blood pressure April 19 133/99, 156/92, 188/65 April 20 157/96, 160/100  This am elevated as well at 160/100  Today elevated at 150/100 in office.  No blurry vision.  Her blood pressure typically runs 90/70 on average       ROS: Negative unless specifically indicated above in HPI.   Relevant past medical history reviewed and updated as indicated.   Allergies and medications reviewed and updated.   Current Outpatient Medications:    acetaminophen  (TYLENOL ) 500 MG tablet, Take 500 mg by mouth as needed for moderate pain or headache. , Disp: , Rfl:    ciclopirox  (PENLAC ) 8 % solution, Apply topically at bedtime. Apply over nail and surrounding skin. Apply daily  over previous coat. After seven (7) days, may remove with alcohol and continue cycle., Disp: 6.6 mL, Rfl: 0   famotidine  (PEPCID ) 20 MG tablet, TAKE 1 TABLET BY MOUTH TWICE A DAY, Disp: 180 tablet, Rfl: 1   furosemide  (LASIX ) 20 MG tablet, Take 1 tablet (20 mg total) by mouth daily., Disp: 90 tablet, Rfl: 0   lisinopril  (ZESTRIL ) 10 MG tablet, Take 1 tablet (10 mg total) by mouth daily., Disp: 90 tablet, Rfl: 3   methocarbamol  (ROBAXIN ) 500 MG tablet, Take 1 tablet (500 mg total) by mouth at bedtime as needed for muscle spasms (back pain)., Disp: 20 tablet, Rfl: 1   metoprolol  succinate (TOPROL -XL) 25 MG 24 hr tablet, Take 0.5 tablets (12.5 mg total) by mouth daily., Disp: 90 tablet, Rfl: 0   omeprazole  (PRILOSEC) 20 MG capsule, Take 1 capsule (20 mg total) by mouth daily., Disp: 30 capsule, Rfl: 3   potassium chloride  (KLOR-CON ) 10 MEQ tablet, TAKE 1 TABLET BY MOUTH EVERY DAY, Disp: 90 tablet, Rfl: 3  Allergies  Allergen Reactions   Lactose Other (See Comments)    Pain,bloating   Lactose Intolerance (Gi) Other (See Comments)    Pain,bloating    Objective:   BP (!) 154/100 (BP Location: Right Arm, Patient Position: Sitting, Cuff Size: Normal)   Pulse 74   Ht 5\' 3"  (1.6 m)   Wt 112 lb 6.4 oz (51 kg)  SpO2 97%   BMI 19.91 kg/m    Physical Exam Constitutional:      General: She is not in acute distress.    Appearance: Normal appearance. She is normal weight. She is not ill-appearing, toxic-appearing or diaphoretic.  HENT:     Head: Normocephalic.  Cardiovascular:     Rate and Rhythm: Normal rate and regular rhythm.  Pulmonary:     Effort: Pulmonary effort is normal.  Abdominal:     General: Bowel sounds are increased.     Palpations: Abdomen is soft.     Tenderness: There is abdominal tenderness in the periumbilical area.     Hernia: A hernia is present. Hernia is present in the umbilical area (unable to reduce due to pain pt resistant and guarding).  Musculoskeletal:         General: Normal range of motion.  Neurological:     General: No focal deficit present.     Mental Status: She is alert and oriented to person, place, and time. Mental status is at baseline.  Psychiatric:        Mood and Affect: Mood normal.        Behavior: Behavior normal.        Thought Content: Thought content normal.        Judgment: Judgment normal.     Assessment & Plan:  Dysuria -     POCT Urinalysis Dipstick (Automated) -     Urinalysis w microscopic + reflex cultur  Periumbilical abdominal pain -     CBC with Differential/Platelet -     Comprehensive metabolic panel with GFR  Essential hypertension -     Lisinopril ; Take 1 tablet (10 mg total) by mouth daily.  Dispense: 90 tablet; Refill: 3  Umbilical hernia without obstruction and without gangrene Assessment & Plan: Suspect possible obstruction  Pt in increased pain  Also suspect HTN r/t pain level  Increase lisinopril  to 10 mg once daily however proceeding increase slowly because typically with hypotension Attempting to get her in to see surgeon today if any openings however gave pt ER precautions as I do feel she should go to ER today if she can not be seen by surgeon, she is hesitant to go to Er.      Follow up plan: Return if symptoms worsen or fail to improve.  Felicita Horns, FNP

## 2024-01-09 NOTE — Patient Instructions (Addendum)
 We have scheduled you for a CT Scan of your Abdomen and Pelvis with contrast. This has been scheduled at [facility] on 01/20/24 . Please arrive there by 8:30am . Only Liquids the morning of the CT Scan, nothing to eat by mouth.  If you need to reschedule your Scan, you may do so by calling [phone]. Please let us  know if you reschedule your scan as we have to get authorization from your insurance for this.          Umbilical Hernia, Adult  A hernia is a lump of tissue that pushes through an opening in the muscles. An umbilical hernia happens in the belly, near the belly button. The hernia may contain tissues from the small or large intestine. It may also have fatty tissue that covers the intestines. Umbilical hernias in adults may get worse over time. They need to be treated with surgery. There are several types of umbilical hernias. They include: Indirect hernia. This occurs just above or below the belly button. It's the most common type of umbilical hernia in adults. Direct hernia. This type occurs in an opening that's formed by the belly button. Reducible hernia. This hernia comes and goes. You may see it only when you strain, cough, or lift something heavy. This type of hernia can be pushed back into the belly (reduced). Incarcerated hernia. This traps the hernia in the wall of the belly. This type of hernia can't be pushed back into the belly. It can cause a strangulated hernia. Strangulated hernia. This hernia cuts off blood flow to the tissues inside the hernia. The tissues can die if this happens. This type of hernia must be treated right away. What are the causes? An umbilical hernia happens when tissue inside the belly pushes through an opening in the muscles of the belly. What increases the risk? You're more likely to get this hernia if: You strain while lifting or pushing heavy objects. You've had several pregnancies. You have a condition that puts  pressure on your belly, and you've had it for a long time. These include: Obesity. A buildup of fluid inside your belly. Vomiting or coughing all the time. Trouble pooping (constipation). You've had surgery that weakened the muscles in the belly. What are the signs or symptoms? The main symptom of this condition is a bulge at the belly button or near it. The bulge does not cause pain. Other symptoms depend on the type of hernia you have. A reducible hernia may be seen only when you strain, cough, or lift something heavy. Other symptoms may include: Dull pain. A feeling of pressure. An incarcerated hernia may cause very bad pain. Also, you may: Vomit or feel like you may vomit. Not be able to pass gas. A strangulated hernia may cause: Pain that gets worse and worse. Vomiting, or feeling like you may vomit. Pain when you press on the hernia. Change of color on the skin over the hernia. The skin may become red or purple. Trouble pooping. Blood in the poop. How is this diagnosed? This condition may be diagnosed based on: Your symptoms and medical history. A physical exam. You may be asked to cough or strain while standing. These actions will put pressure inside your belly. The pressure can force the hernia through the opening in your muscles. Your health care provider may try to push the hernia back into your belly (reduce). How is this treated? Surgery is the only treatment for an umbilical hernia. Surgery  for a strangulated hernia must be done right away. If you have a small hernia that's not incarcerated, you may need to lose weight before the surgery is done. Follow these instructions at home: Managing constipation You may need to take these actions to prevent trouble pooping. This will help to prevent straining. Drink enough fluid to keep your pee (urine) pale yellow. Take over-the-counter or prescription medicines. Eat foods that are high in fiber, such as beans, whole grains,  and fresh fruits and vegetables. Limit foods that are high in fat and sugars, such as fried or sweet foods. General instructions Do not try to push the hernia back in. Lose weight, if told by your provider. Watch your hernia for any changes in color or size. Tell your provider if any changes occur. You may need to avoid activities that put pressure on your hernia. You may have to avoid lifting. Ask your provider how much you can safely lift. Take over-the-counter and prescription medicines only as told by your provider. Contact a health care provider if: Your hernia gets larger or feels hard. Your hernia becomes painful. You get a fever or chills. Get help right away if: You get very bad pain near the area of the hernia, and the pain comes on suddenly. You have pain and you vomit or feel like you may vomit. The skin over your hernia changes color. These symptoms may be an emergency. Get help right away. Call 911. Do not wait to see if the symptoms go away. Do not drive yourself to the hospital. This information is not intended to replace advice given to you by your health care provider. Make sure you discuss any questions you have with your health care provider. Document Revised: 12/28/2022 Document Reviewed: 12/28/2022 Elsevier Patient Education  2024 [employer].

## 2024-01-09 NOTE — Telephone Encounter (Signed)
 Please advise pt she can not return to work until cleared by the Careers adviser.

## 2024-01-09 NOTE — Telephone Encounter (Signed)
 Patient came back in the office and wanted to know if Madison Miller could update her work note for her to return to work on Wednesday January 11, 2024 instead of next Monday 01/16/24

## 2024-01-09 NOTE — Patient Instructions (Signed)
 Increase your lisinopril  to 10 mg once daily Monitor blood pressure daily.

## 2024-01-09 NOTE — Addendum Note (Signed)
 Addended by: Dewanda Foots C on: 01/09/2024 11:09 AM   Modules accepted: Orders

## 2024-01-09 NOTE — Assessment & Plan Note (Signed)
 Suspect possible obstruction  Pt in increased pain  Also suspect HTN r/t pain level  Increase lisinopril  to 10 mg once daily however proceeding increase slowly because typically with hypotension Attempting to get her in to see surgeon today if any openings however gave pt ER precautions as I do feel she should go to ER today if she can not be seen by surgeon, she is hesitant to go to Er.

## 2024-01-09 NOTE — Addendum Note (Signed)
 Addended by: Gerry Krone on: 01/09/2024 12:03 PM   Modules accepted: Orders

## 2024-01-09 NOTE — Telephone Encounter (Signed)
 Please call pt tomorrow 4/22 and ask how blood pressure has been looking?

## 2024-01-09 NOTE — Telephone Encounter (Signed)
 Spoke with pt and she is aware of Tabitha's response. Nothing further was needed.

## 2024-01-10 ENCOUNTER — Ambulatory Visit: Payer: Self-pay

## 2024-01-10 ENCOUNTER — Telehealth: Payer: Self-pay | Admitting: Surgery

## 2024-01-10 DIAGNOSIS — I10 Essential (primary) hypertension: Secondary | ICD-10-CM

## 2024-01-10 LAB — URINALYSIS W MICROSCOPIC + REFLEX CULTURE
Bilirubin Urine: NEGATIVE
Glucose, UA: NEGATIVE
Hgb urine dipstick: NEGATIVE
Hyaline Cast: NONE SEEN /LPF
Leukocyte Esterase: NEGATIVE
Nitrites, Initial: NEGATIVE
Protein, ur: NEGATIVE
RBC / HPF: NONE SEEN /HPF (ref 0–2)
Specific Gravity, Urine: 1.013 (ref 1.001–1.035)
Squamous Epithelial / HPF: NONE SEEN /HPF (ref <=5)
pH: 5.5 (ref 5.0–8.0)

## 2024-01-10 LAB — NO CULTURE INDICATED

## 2024-01-10 MED ORDER — LISINOPRIL 5 MG PO TABS: 5.0000 mg | ORAL_TABLET | Freq: Every day | ORAL | 3 refills | Status: AC

## 2024-01-10 NOTE — Telephone Encounter (Signed)
 Patient calls asking for a work note for when she was in the office on 01/09/24 to see Dr. Dana Duncan.  Please call her when ready and she will come by to pick up.  Thank you.

## 2024-01-10 NOTE — Telephone Encounter (Signed)
 Patient was seen in office yesterday by PCP. Patient asked this RN what the signs and symptoms of food poisoning are. This RN explained NVD, fever, etc. Patient denied experiencing these symptoms. Patient was seen in office for umbilical hernia yesterday as well. Patient wanted this RN to let her PCP know that her BP this morning was 129/91. Per chart, patient is to begin taking 10mg  lisinopril . Patient stated she has not filled this medication yet. Advised patient to contact pharmacy. Advised patient to continue to monitor BP once she starts taking new BP medication. Routing to office for FYI.   Copied from CRM [id number]. Topic: Clinical - Red Word Triage >> Jan 10, 2024  8:11 AM Annelle Kiel wrote: [facility] that prompted transfer to Nurse Triage: patient thinks she has food pos ion Reason for Disposition  [1] Caller requesting NON-URGENT health information AND [2] PCP's office is the best resource  Protocols used: Information Only Call - No Triage-A-AH

## 2024-01-10 NOTE — Telephone Encounter (Signed)
 I spoke with the pt earlier (documented on another message). I advised her to keep taking Lisinopril  5mg  daily as instructed by [place]. I asked the pt about the date that her CT is scheduled and she told me that she requested to have it scheduled in May. It was originally scheduled for 01/20/2024 but she called and had it moved to 02/06/24. When I asked why she rescheduled she stated, "Because I wanted to."  Called and spoke with pt again. She is very confused. States that Lisinopril  is causing her BP to go up. She checked her BP while on the phone with me and it was 140/88. Pt called in earlier this morning and it was only reading 120/91.   Tabitha - does pt need to increase her BP meds per your instructions from yesterday's visit?

## 2024-01-10 NOTE — Telephone Encounter (Signed)
 Have we called her about the message earlier?  Lisinopril  does not cause her blood pressure to go up.  Did she get the CT?  What did the surgeon say?

## 2024-01-10 NOTE — Telephone Encounter (Signed)
 Spoke with pt and she is aware of Tabitha's message.

## 2024-01-10 NOTE — Progress Notes (Signed)
Urine negative for UTI

## 2024-01-10 NOTE — Telephone Encounter (Signed)
 Patient notified that her note is ready for pick up at the front desk.

## 2024-01-10 NOTE — Telephone Encounter (Signed)
 See telephone encounter 01/10/2024.

## 2024-01-10 NOTE — Progress Notes (Signed)
 Outpatient Surgical Follow Up    Madison Miller is an 73 y.o. female.   Chief Complaint  Patient presents with   Follow-up    Umbilical hernia     HPI: Madison Miller is a 73 yo female well-known to me with a history of an umbilical hernia.  I did evaluate her last year and she refused any surgical intervention.  She was seen by her PCP and was found to have severe pain and was sent back to me again.  He reports that he has diffuse abdominal pain in the periumbilical region for several days.  This is intermittent sharp and moderate to severe in intensity.  No specific alleviating or aggravating factors.  The patient is not very communicative and does not elaborate too much, he is hesitant and resistant to medical and surgical therapies.  CT pers reviewed showing uncomplicated UH and cholelithiasis  D/W Madison Miller.  Past Surgical History:  Procedure Laterality Date   LUMBAR LAMINECTOMY  2010   Dr. Susen Epstein   PARATHYROIDECTOMY  2009    Family History  Problem Relation Age of Onset   Other Mother        barin tumor   Stroke Father    Sudden death Daughter    Colon cancer Neg Hx    Rectal cancer Neg Hx    Stomach cancer Neg Hx     Social History:  reports that she has never smoked. She has never been exposed to tobacco smoke. She has never used smokeless tobacco. She reports that she does not drink alcohol and does not use drugs.  Allergies:  Allergies  Allergen Reactions   Lactose Other (See Comments)    Pain,bloating   Lactose Intolerance (Gi) Other (See Comments)    Pain,bloating    Medications reviewed.    ROS Full ROS performed and is otherwise negative other than what is stated in HPI   BP 122/81   Pulse 79   Temp 98.4 F (36.9 C) (Oral)   Ht 5\' 5"  (1.651 m)   Wt 111 lb 9.6 oz (50.6 kg)   SpO2 99%   BMI 18.57 kg/m   Physical Exam CONSTITUTIONAL: NAD. EYES: Pupils are equal, round, Sclera are non-icteric. EARS, NOSE, MOUTH AND THROAT: The oropharynx  is clear. The oral mucosa is pink and moist. Hearing is intact to voice. LYMPH NODES:  Lymph nodes in the neck are normal. RESPIRATORY:  Lungs are clear. There is normal respiratory effort, with equal breath sounds bilaterally, and without pathologic use of accessory muscles. CARDIOVASCULAR: Heart is regular without murmurs, gallops, or rubs. GI: The abdomen is  soft, tender 3 cm umbilical hernia seems to be reducible but exam limits assessment. There is no hepatosplenomegaly. There are normal bowel sounds in all quadrants. GU: Rectal deferred.   MUSCULOSKELETAL: Normal muscle strength and tone. No cyanosis or edema.   SKIN: Turgor is good and there are no pathologic skin lesions or ulcers. NEUROLOGIC: Motor and sensation is grossly normal. Cranial nerves are grossly intact. PSYCH:  Oriented to person, place and time. Affect is normal.    Results for orders placed or performed in visit on 01/09/24 (from the past 48 hours)  POCT Urinalysis Dipstick (Automated)     Status: Abnormal   Collection Time: 01/09/24 11:08 AM  Result Value Ref Range   Color, UA yellow    Clarity, UA clear    Glucose, UA Negative Negative   Bilirubin, UA neg    Ketones, UA positive (A)  Spec Grav, UA 1.020 1.010 - 1.025   Blood, UA positive (small) (PU)    pH, UA 5.0 5.0 - 8.0   Protein, UA Positive (A) Negative   Urobilinogen, UA 0.2 0.2 or 1.0 E.U./dL   Nitrite, UA negative    Leukocytes, UA Negative Negative  Urinalysis w microscopic + reflex cultur     Status: Abnormal   Collection Time: 01/09/24 11:11 AM   Specimen: Urine  Result Value Ref Range   Color, Urine YELLOW YELLOW   APPearance CLEAR CLEAR   Specific Gravity, Urine 1.013 1.001 - 1.035   pH 5.5 5.0 - 8.0   Glucose, UA NEGATIVE NEGATIVE   Bilirubin Urine NEGATIVE NEGATIVE   Ketones, ur TRACE (A) NEGATIVE   Hgb urine dipstick NEGATIVE NEGATIVE   Protein, ur NEGATIVE NEGATIVE   Nitrites, Initial NEGATIVE NEGATIVE   Leukocyte Esterase  NEGATIVE NEGATIVE   WBC, UA 0-5 0 - 5 /HPF   RBC / HPF NONE SEEN 0 - 2 /HPF   Squamous Epithelial / HPF NONE SEEN < OR = 5 /HPF   Bacteria, UA FEW (A) NONE SEEN /HPF   Hyaline Cast NONE SEEN NONE SEEN /LPF   Note      Comment: This urine was analyzed for the presence of WBC,  RBC, bacteria, casts, and other formed elements.  Only those elements seen were reported. . .   REFLEXIVE URINE CULTURE     Status: None   Collection Time: 01/09/24 11:11 AM  Result Value Ref Range   Reflexve Urine Culture      Comment: NO CULTURE INDICATED   No results found.  Assessment/Plan: Umbilical abdominal pain without evidence of incarcerated hernia.  Discussed with patient in detail about her disease process.  I do not recommend any immediate surgical intervention.  I will go ahead and obtain a CT scan of the abdomen pelvis to evaluate her for intra-abdominal pathology and abdominal wall. Differential will include diverticulitis, enteritis, biliary colic vs potential abd wall hernia. I spent 40 minutes in this encounter occluding extensive review of medical records, multiple review of CT scans personally, placing orders, counseling the patient and performing documentation  I will see her back after this is completed.  Evelia Hipp, MD [facility] General Surgeon

## 2024-01-10 NOTE — Telephone Encounter (Signed)
 With this lower blood pressure I would actually maintain lisinopril  at 5 mg once daily instead of go with the increase.

## 2024-01-10 NOTE — Telephone Encounter (Signed)
  Chief Complaint: BP elevated (see assessment) Symptoms: moderate headache Frequency: today Pertinent Negatives: Patient denies abd. Pain, dizziness, weakness or numbness to 1 side of body Disposition: [] ED /[] Urgent Care (no appt availability in office) / [] Appointment(In office/virtual)/ []  [facility]/ [] Home Care/ [] Refused Recommended Disposition /[] [facility]/ []  Follow-up with PCP Additional Notes: was in office yesterday.Note: pt didn't take her lisinopril  this am. Advised pt to take med but pt stated"it caused my blood pressure to go up." Advised pt again and she stated she will wait on the doctor's advice Copied from CRM [id number]. Topic: Clinical - Medical Advice >> Jan 10, 2024 11:24 AM Ovid Blow wrote: Reason for CRM: Patient stated that BP keeps increasing with BP medication. Needs some advice. Reason for Disposition  [1] Systolic BP  >= 130 OR Diastolic >= 80 AND [2] taking BP medications  Answer Assessment - Initial Assessment Questions 1. BLOOD PRESSURE: "What is the blood pressure?" "Did you take at least two measurements 5 minutes apart?"     129/91  135/92      142/97               yesterday: 160/100 and took all 3 meds 2. ONSET: "When did you take your blood pressure?"     This am  3. HOW: "How did you take your blood pressure?" (e.g., automatic home BP monitor, visiting nurse)     Auto home BP 4. HISTORY: "Do you have a history of high blood pressure?"     yes 5. MEDICINES: "Are you taking any medicines for blood pressure?" "Have you missed any doses recently?"     Yes- didn't take lisinopril  advised to take but  6. OTHER SYMPTOMS: "Do you have any symptoms?" (e.g., blurred vision, chest pain, difficulty breathing, headache, weakness)     headache  Protocols used: Blood Pressure - High-A-AH

## 2024-01-10 NOTE — Addendum Note (Signed)
 Addended by: Felicita Horns on: 01/10/2024 10:30 AM   Modules accepted: Orders

## 2024-01-10 NOTE — Telephone Encounter (Signed)
 Good afternoon Dr. Dana Duncan,   I was curious what you had discussed at the visit with Madison Miller? She is very unclear when we ask her and she gets confused easily so she didn't give me any direction. I know she is pretty resistant as well when it comes to recommendations. I can only see your after visit summary in epic, it doesn't show me the entire note.   Did you feel its consistent with her hernia or could it be other process?  She had scheduled the CT for 5/2 and then she self rescheduled to 5/19 so no clear answers yet.   I appreciate your time and for getting her in yesterday.   Regards,   Felicita Horns FNP-C

## 2024-01-11 NOTE — Telephone Encounter (Signed)
 Thank you :)

## 2024-01-20 ENCOUNTER — Ambulatory Visit

## 2024-01-30 ENCOUNTER — Ambulatory Visit: Admitting: Surgery

## 2024-02-06 ENCOUNTER — Ambulatory Visit
Admission: RE | Admit: 2024-02-06 | Discharge: 2024-02-06 | Disposition: A | Discharge status: Home / Self Care | Source: Ambulatory Visit | Attending: Surgery | Admitting: Surgery

## 2024-02-06 DIAGNOSIS — K5730 Diverticulosis of large intestine without perforation or abscess without bleeding: Secondary | ICD-10-CM | POA: Diagnosis not present

## 2024-02-06 DIAGNOSIS — K429 Umbilical hernia without obstruction or gangrene: Secondary | ICD-10-CM | POA: Insufficient documentation

## 2024-02-06 DIAGNOSIS — N200 Calculus of kidney: Secondary | ICD-10-CM | POA: Diagnosis not present

## 2024-02-06 MED ORDER — IOHEXOL 300 MG/ML  SOLN
100.0000 mL | Freq: Once | INTRAMUSCULAR | Status: AC | PRN
  Administered 2024-02-06: 100 mL via INTRAVENOUS

## 2024-02-11 ENCOUNTER — Other Ambulatory Visit: Payer: Self-pay | Admitting: Family

## 2024-02-11 DIAGNOSIS — I10 Essential (primary) hypertension: Secondary | ICD-10-CM

## 2024-02-12 ENCOUNTER — Other Ambulatory Visit: Payer: Self-pay | Admitting: Family

## 2024-02-12 DIAGNOSIS — R12 Heartburn: Secondary | ICD-10-CM

## 2024-02-17 ENCOUNTER — Encounter

## 2024-03-04 ENCOUNTER — Other Ambulatory Visit: Payer: Self-pay | Admitting: Family

## 2024-03-04 DIAGNOSIS — I509 Heart failure, unspecified: Secondary | ICD-10-CM

## 2024-05-15 ENCOUNTER — Ambulatory Visit: Payer: Self-pay

## 2024-05-15 NOTE — Telephone Encounter (Signed)
 FYI Only or Action Required?: FYI only for provider.  Patient was last seen in primary care on 01/09/2024 by Corwin Antu, FNP.  Called Nurse Triage reporting Back Pain.  Symptoms began several days ago.  Interventions attempted: OTC medications: muscle cream.  Symptoms are: unchanged.  Triage Disposition: See PCP When Office is Open (Within 3 Days)  Patient/caregiver understands and will follow disposition?: Yes            Copied from CRM [id number]. Topic: Clinical - Red Word Triage >> May 15, 2024 12:06 PM [place] E wrote: [facility] that prompted transfer to Nurse Triage: Back pain. Patient stated she is having lower back pain that has been going on for 4-5 days, rated pain 7-8 out of 10. Reason for Disposition  [1] MODERATE back pain (e.g., interferes with normal activities) AND [2] present > 3 days  Answer Assessment - Initial Assessment Questions 1. ONSET: When did the pain begin? (e.g., minutes, hours, days)     4-5 days ago 2. LOCATION: Where does it hurt? (upper, mid or lower back)     Lower back  3. SEVERITY: How bad is the pain?  (e.g., Scale 1-10; mild, moderate, or severe)     7-8/10 4. PATTERN: Is the pain constant? (e.g., yes, no; constant, intermittent)      constant 5. RADIATION: Does the pain shoot into your legs or somewhere else?     denies 6. CAUSE:  What do you think is causing the back pain?      denies 7. BACK OVERUSE:  Any recent lifting of heavy objects, strenuous work or exercise?     denies 8. MEDICINES: What have you taken so far for the pain? (e.g., nothing, acetaminophen , NSAIDS)     ointment 9. NEUROLOGIC SYMPTOMS: Do you have any weakness, numbness, or problems with bowel/bladder control?     no 10. OTHER SYMPTOMS: Do you have any other symptoms? (e.g., fever, abdomen pain, burning with urination, blood in urine)       no 11. PREGNANCY: Is there any chance you are pregnant? When was your last menstrual  period?       na  Protocols used: Back Pain-A-AH

## 2024-05-15 NOTE — Telephone Encounter (Signed)
 Patient scheduled tomorrow with Dr. Avelina

## 2024-05-15 NOTE — Telephone Encounter (Signed)
 NOTED

## 2024-05-16 ENCOUNTER — Ambulatory Visit (INDEPENDENT_AMBULATORY_CARE_PROVIDER_SITE_OTHER): Admitting: Family Medicine

## 2024-05-16 VITALS — BP 138/80 | HR 65 | Temp 97.5°F | Ht 65.0 in | Wt 109.6 lb

## 2024-05-16 DIAGNOSIS — M545 Low back pain, unspecified: Secondary | ICD-10-CM | POA: Diagnosis not present

## 2024-05-16 MED ORDER — CYCLOBENZAPRINE HCL 5 MG PO TABS: 5.0000 mg | ORAL_TABLET | Freq: Every day | ORAL | 0 refills | Status: AC

## 2024-05-16 MED ORDER — PREDNISONE 20 MG PO TABS: ORAL_TABLET | ORAL | 0 refills | Status: DC

## 2024-05-16 NOTE — Progress Notes (Signed)
 Patient ID: Madison Miller, female    DOB: 11-22-1950, 73 y.o.   MRN: [id number]  This visit was conducted in person.  BP 138/80   Pulse 65   Temp (!) 97.5 F (36.4 C) (Oral)   Ht 5' 5 (1.651 m)   Wt 109 lb 9.6 oz (49.7 kg)   SpO2 97%   BMI 18.24 kg/m    CC:  Chief Complaint  Patient presents with   Back Pain    Pt complains of mid to lower back pain that started about a week ago. Pt has achy pains. Has taken OTC pain relievers but no help. Taken none today. Pain level 8.     Subjective:   HPI: Madison Miller is a 73 y.o. female presenting on 05/16/2024 for Back Pain (Pt complains of mid to lower back pain that started about a week ago. Pt has achy pains. Has taken OTC pain relievers but no help. Taken none today. Pain level 8. )  New onset low back pain. Started 2 weeks ago.  Pain 8/10 on pain scale  Pain across low back.  No radiation of pain.  No new numbness or weakness.  Pain not keeping her up at night.   No proceeding fall or change in activity.   No fever, no incontinence, no dysuria.   She has tried advil  200-400 mg every 6 hours... helped minimally.     Hx of  back surgery many years ago  2021 back X-ray: reviewed  IMPRESSION: 1. Mild spondylosis of the lumbar spine with stable grade 1 anterolisthesis of L5 on S1. Stable postsurgical changes with posterior fusion hardware intact from L4-S1.       Relevant past medical, surgical, family and social history reviewed and updated as indicated. Interim medical history since our last visit reviewed. Allergies and medications reviewed and updated. Outpatient Medications Prior to Visit  Medication Sig Dispense Refill   acetaminophen  (TYLENOL ) 500 MG tablet Take 500 mg by mouth as needed for moderate pain or headache.      ciclopirox  (PENLAC ) 8 % solution Apply topically at bedtime. Apply over nail and surrounding skin. Apply daily over previous coat. After seven (7) days, may remove with alcohol and  continue cycle. 6.6 mL 0   famotidine  (PEPCID ) 20 MG tablet TAKE 1 TABLET BY MOUTH TWICE A DAY 180 tablet 1   lisinopril  (ZESTRIL ) 5 MG tablet Take 1 tablet (5 mg total) by mouth daily. 90 tablet 3   methocarbamol  (ROBAXIN ) 500 MG tablet Take 1 tablet (500 mg total) by mouth at bedtime as needed for muscle spasms (back pain). 20 tablet 1   metoprolol  succinate (TOPROL -XL) 25 MG 24 hr tablet TAKE 1/2 TABLET (12.5MG ) BY MOUTH EVERY DAY 45 tablet 1   omeprazole  (PRILOSEC) 20 MG capsule TAKE 1 CAPSULE BY MOUTH EVERY DAY 90 capsule 1   potassium chloride  (KLOR-CON ) 10 MEQ tablet TAKE 1 TABLET BY MOUTH EVERY DAY 90 tablet 3   furosemide  (LASIX ) 20 MG tablet TAKE 1 TABLET BY MOUTH EVERY DAY 90 tablet 0   No facility-administered medications prior to visit.     Per HPI unless specifically indicated in ROS section below Review of Systems  Constitutional:  Negative for fatigue and fever.  HENT:  Negative for congestion.   Eyes:  Negative for pain.  Respiratory:  Negative for cough and shortness of breath.   Cardiovascular:  Negative for chest pain, palpitations and leg swelling.  Gastrointestinal:  Negative for abdominal pain.  Genitourinary:  Negative for dysuria and vaginal bleeding.  Musculoskeletal:  Positive for back pain.  Neurological:  Negative for syncope, light-headedness and headaches.  Psychiatric/Behavioral:  Negative for dysphoric mood.    Objective:  BP 138/80   Pulse 65   Temp (!) 97.5 F (36.4 C) (Oral)   Ht 5' 5 (1.651 m)   Wt 109 lb 9.6 oz (49.7 kg)   SpO2 97%   BMI 18.24 kg/m   Wt Readings from Last 3 Encounters:  05/16/24 109 lb 9.6 oz (49.7 kg)  01/09/24 111 lb 9.6 oz (50.6 kg)  01/09/24 112 lb 6.4 oz (51 kg)      Physical Exam Constitutional:      General: She is not in acute distress.    Appearance: Normal appearance. She is well-developed. She is not ill-appearing or toxic-appearing.  HENT:     Head: Normocephalic.     Right Ear: Hearing, tympanic  membrane, ear canal and external ear normal. Tympanic membrane is not erythematous, retracted or bulging.     Left Ear: Hearing, tympanic membrane, ear canal and external ear normal. Tympanic membrane is not erythematous, retracted or bulging.     Nose: No mucosal edema or rhinorrhea.     Right Sinus: No maxillary sinus tenderness or frontal sinus tenderness.     Left Sinus: No maxillary sinus tenderness or frontal sinus tenderness.     Mouth/Throat:     Pharynx: Uvula midline.  Eyes:     General: Lids are normal. Lids are everted, no foreign bodies appreciated.     Conjunctiva/sclera: Conjunctivae normal.     Pupils: Pupils are equal, round, and reactive to light.  Neck:     Thyroid : No thyroid  mass or thyromegaly.     Vascular: No carotid bruit.     Trachea: Trachea normal.  Cardiovascular:     Rate and Rhythm: Normal rate and regular rhythm.     Pulses: Normal pulses.     Heart sounds: Normal heart sounds, S1 normal and S2 normal. No murmur heard.    No friction rub. No gallop.  Pulmonary:     Effort: Pulmonary effort is normal. No tachypnea or respiratory distress.     Breath sounds: Normal breath sounds. No decreased breath sounds, wheezing, rhonchi or rales.  Abdominal:     General: Bowel sounds are normal.     Palpations: Abdomen is soft.     Tenderness: There is no abdominal tenderness.  Musculoskeletal:     Cervical back: Normal range of motion and neck supple.     Lumbar back: Tenderness present. No bony tenderness. Decreased range of motion. Negative right straight leg raise test and negative left straight leg raise test.  Skin:    General: Skin is warm and dry.     Findings: No rash.  Neurological:     Mental Status: She is alert.  Psychiatric:        Mood and Affect: Mood is not anxious or depressed.        Speech: Speech normal.        Behavior: Behavior normal. Behavior is cooperative.        Thought Content: Thought content normal.        Judgment: Judgment  normal.       Results for orders placed or performed in visit on 01/09/24  POCT Urinalysis Dipstick (Automated)   Collection Time: 01/09/24 11:08 AM  Result Value Ref Range   Color, UA yellow    Clarity, UA clear  Glucose, UA Negative Negative   Bilirubin, UA neg    Ketones, UA positive (A)    Spec Grav, UA 1.020 1.010 - 1.025   Blood, UA positive (small) (PU)    pH, UA 5.0 5.0 - 8.0   Protein, UA Positive (A) Negative   Urobilinogen, UA 0.2 0.2 or 1.0 E.U./dL   Nitrite, UA negative    Leukocytes, UA Negative Negative  Urinalysis w microscopic + reflex cultur   Collection Time: 01/09/24 11:11 AM   Specimen: Urine  Result Value Ref Range   Color, Urine YELLOW YELLOW   APPearance CLEAR CLEAR   Specific Gravity, Urine 1.013 1.001 - 1.035   pH 5.5 5.0 - 8.0   Glucose, UA NEGATIVE NEGATIVE   Bilirubin Urine NEGATIVE NEGATIVE   Ketones, ur TRACE (A) NEGATIVE   Hgb urine dipstick NEGATIVE NEGATIVE   Protein, ur NEGATIVE NEGATIVE   Nitrites, Initial NEGATIVE NEGATIVE   Leukocyte Esterase NEGATIVE NEGATIVE   WBC, UA 0-5 0 - 5 /HPF   RBC / HPF NONE SEEN 0 - 2 /HPF   Squamous Epithelial / HPF NONE SEEN < OR = 5 /HPF   Bacteria, UA FEW (A) NONE SEEN /HPF   Hyaline Cast NONE SEEN NONE SEEN /LPF   Note    REFLEXIVE URINE CULTURE   Collection Time: 01/09/24 11:11 AM  Result Value Ref Range   Reflexve Urine Culture      Assessment and Plan  Acute left-sided low back pain, unspecified whether sciatica present Assessment & Plan: Acute flare of chronic issue Most likely musculoskeletal strain versus osteoarthritis. Will treat with prednisone  taper, heat, gentle exercises. Muscle relaxant given for her to use at night.  Cautioned her about possible side effects.  No indication for x-ray at office visit today, no red flags.   If not improving as expected she will follow-up for further reevaluation in 2 to 4 weeks.   Other orders -     predniSONE ; 3 tabs by mouth daily x 3  days, then 2 tabs by mouth daily x 2 days then 1 tab by mouth daily x 2 days  Dispense: 15 tablet; Refill: 0 -     Cyclobenzaprine  HCl; Take 1 tablet (5 mg total) by mouth at bedtime.  Dispense: 15 tablet; Refill: 0    No follow-ups on file.   Greig Ring, MD

## 2024-06-03 ENCOUNTER — Other Ambulatory Visit: Payer: Self-pay | Admitting: Family

## 2024-06-03 DIAGNOSIS — I509 Heart failure, unspecified: Secondary | ICD-10-CM

## 2024-06-09 ENCOUNTER — Other Ambulatory Visit: Payer: Self-pay | Admitting: Family

## 2024-06-09 DIAGNOSIS — I509 Heart failure, unspecified: Secondary | ICD-10-CM

## 2024-06-11 NOTE — Assessment & Plan Note (Signed)
 Acute flare of chronic issue Most likely musculoskeletal strain versus osteoarthritis. Will treat with prednisone  taper, heat, gentle exercises. Muscle relaxant given for her to use at night.  Cautioned her about possible side effects.  No indication for x-ray at office visit today, no red flags.   If not improving as expected she will follow-up for further reevaluation in 2 to 4 weeks.

## 2024-07-03 ENCOUNTER — Ambulatory Visit

## 2024-07-03 VITALS — Ht 65.0 in | Wt 109.0 lb

## 2024-07-03 DIAGNOSIS — Z0000 Encounter for general adult medical examination without abnormal findings: Secondary | ICD-10-CM | POA: Diagnosis not present

## 2024-07-03 NOTE — Progress Notes (Signed)
 Please attest and cosign this visit due to patients primary care provider not being in the office at the time the visit was completed.    Subjective:   Madison Miller is a 73 y.o. who presents for a Medicare Wellness preventive visit.  As a reminder, Annual Wellness Visits don't include a physical exam, and some assessments may be limited, especially if this visit is performed virtually. We may recommend an in-person follow-up visit with your provider if needed.  Visit Complete: Virtual I connected with  Madison Miller on 07/03/24 by a audio enabled telemedicine application and verified that I am speaking with the correct person using two identifiers.  Patient Location: Home  Provider Location: Office/Clinic  I discussed the limitations of evaluation and management by telemedicine. The patient expressed understanding and agreed to proceed.  Vital Signs: Because this visit was a virtual/telehealth visit, some criteria may be missing or patient reported. Any vitals not documented were not able to be obtained and vitals that have been documented are patient reported.  VideoDeclined- This patient declined Librarian, academic. Therefore the visit was completed with audio only.  Persons Participating in Visit: Patient.  AWV Questionnaire: No: Patient Medicare AWV questionnaire was not completed prior to this visit.  Cardiac Risk Factors include: advanced age (>36men, >32 women);dyslipidemia;hypertension     Objective:    Today's Vitals   07/03/24 1344 07/03/24 1345  Weight: 109 lb (49.4 kg)   Height: 5' 5 (1.651 m)   PainSc:  5    Body mass index is 18.14 kg/m.     07/03/2024    1:52 PM 11/11/2023    3:38 PM 12/15/2022   11:52 AM 10/27/2022    7:11 PM 03/15/2020   12:09 AM 07/27/2019    6:24 PM 06/17/2017   11:05 PM  Advanced Directives  Does Patient Have a Medical Advance Directive? No No No No No No No   Would patient like information on  creating a medical advance directive?  No - Patient declined No - Patient declined  No - Patient declined No - Patient declined      Data saved with a previous flowsheet row definition    Current Medications (verified) Outpatient Encounter Medications as of 07/03/2024  Medication Sig   acetaminophen  (TYLENOL ) 500 MG tablet Take 500 mg by mouth as needed for moderate pain or headache.    ciclopirox  (PENLAC ) 8 % solution Apply topically at bedtime. Apply over nail and surrounding skin. Apply daily over previous coat. After seven (7) days, may remove with alcohol and continue cycle.   cyclobenzaprine  (FLEXERIL ) 5 MG tablet Take 1 tablet (5 mg total) by mouth at bedtime.   famotidine  (PEPCID ) 20 MG tablet TAKE 1 TABLET BY MOUTH TWICE A DAY   furosemide  (LASIX ) 20 MG tablet TAKE 1 TABLET BY MOUTH EVERY DAY   lisinopril  (ZESTRIL ) 5 MG tablet Take 1 tablet (5 mg total) by mouth daily.   methocarbamol  (ROBAXIN ) 500 MG tablet Take 1 tablet (500 mg total) by mouth at bedtime as needed for muscle spasms (back pain).   metoprolol  succinate (TOPROL -XL) 25 MG 24 hr tablet TAKE 1/2 TABLET (12.5MG ) BY MOUTH EVERY DAY   omeprazole  (PRILOSEC) 20 MG capsule TAKE 1 CAPSULE BY MOUTH EVERY DAY   potassium chloride  (KLOR-CON ) 10 MEQ tablet TAKE 1 TABLET BY MOUTH EVERY DAY   predniSONE  (DELTASONE ) 20 MG tablet 3 tabs by mouth daily x 3 days, then 2 tabs by mouth daily x 2  days then 1 tab by mouth daily x 2 days (Patient not taking: Reported on 07/03/2024)   No facility-administered encounter medications on file as of 07/03/2024.    Allergies (verified) Lactose and Lactose intolerance (gi)   History: Past Medical History:  Diagnosis Date   CHF (congestive heart failure) (HCC)    GERD (gastroesophageal reflux disease)    HTN (hypertension)    Hyperparathyroidism    Migraine, unspecified, with intractable migraine, so stated, without mention of status migrainosus    Right bundle branch block and left anterior  fascicular block    SVT (supraventricular tachycardia)    Past Surgical History:  Procedure Laterality Date   LUMBAR LAMINECTOMY  2010   Dr. Amon   PARATHYROIDECTOMY  2009   Family History  Problem Relation Age of Onset   Other Mother        barin tumor   Stroke Father    Sudden death Daughter    Colon cancer Neg Hx    Rectal cancer Neg Hx    Stomach cancer Neg Hx    Social History   Socioeconomic History   Marital status: Widowed    Spouse name: Not on file   Number of children: 6   Years of education: Not on file   Highest education level: Not on file  Occupational History   Not on file  Tobacco Use   Smoking status: Never    Passive exposure: Never   Smokeless tobacco: Never  Vaping Use   Vaping status: Never Used  Substance and Sexual Activity   Alcohol use: No    Alcohol/week: 0.0 standard drinks of alcohol   Drug use: No   Sexual activity: Never  Other Topics Concern   Not on file  Social History Narrative   Not on file   Social Drivers of Health   Financial Resource Strain: Low Risk  (07/03/2024)   Overall Financial Resource Strain (CARDIA)    Difficulty of Paying Living Expenses: Not hard at all  Food Insecurity: No Food Insecurity (07/03/2024)   Hunger Vital Sign    Worried About Running Out of Food in the Last Year: Never true    Ran Out of Food in the Last Year: Never true  Transportation Needs: No Transportation Needs (07/03/2024)   PRAPARE - Administrator, Civil Service (Medical): No    Lack of Transportation (Non-Medical): No  Physical Activity: Insufficiently Active (07/03/2024)   Exercise Vital Sign    Days of Exercise per Week: 2 days    Minutes of Exercise per Session: 40 min  Stress: No Stress Concern Present (07/03/2024)   [employer] of Occupational Health - Occupational Stress Questionnaire    Feeling of Stress: Not at all  Social Connections: Moderately Isolated (07/03/2024)   Social Connection and Isolation  Panel    Frequency of Communication with Friends and Family: More than three times a week    Frequency of Social Gatherings with Friends and Family: More than three times a week    Attends Religious Services: More than 4 times per year    Active Member of [employer] or Organizations: No    Attends Banker Meetings: Never    Marital Status: Widowed    Tobacco Counseling Counseling given: Not Answered    Clinical Intake:  Pre-visit preparation completed: Yes  Pain : 0-10 Pain Score: 5  Pain Location: Abdomen Pain Descriptors / Indicators: Aching Pain Onset: Yesterday Pain Frequency: Constant Pain Relieving Factors: nothing yet other  than water  Pain Relieving Factors: nothing yet other than water  BMI - recorded: 18.14 Nutritional Status: BMI <19  Underweight Nutritional Risks: None Diabetes: No  Lab Results  Component Value Date   HGBA1C  11/23/2007    5.0 (NOTE)   The ADA recommends the following therapeutic goals for glycemic   control related to Hgb A1C measurement:   Goal of Therapy:   < 7.0% Hgb A1C   Action Suggested:  > 8.0% Hgb A1C   Ref:  Diabetes Care, 22, Suppl. 1, 1999     How often do you need to have someone help you when you read instructions, pamphlets, or other written materials from your doctor or pharmacy?: 1 - Never  Interpreter Needed?: No  Comments: lives alone Information entered by :: B.Tyneisha Hegeman,LPN   Activities of Daily Living     07/03/2024    1:52 PM  In your present state of health, do you have any difficulty performing the following activities:  Hearing? 0  Vision? 0  Difficulty concentrating or making decisions? 0  Walking or climbing stairs? 0  Dressing or bathing? 0  Doing errands, shopping? 0  Preparing Food and eating ? N  Using the Toilet? N  In the past six months, have you accidently leaked urine? N  Do you have problems with loss of bowel control? N  Managing your Medications? N  Managing your Finances? N   Housekeeping or managing your Housekeeping? N    Patient Care Team: Corwin Antu, FNP as PCP - General (Family Medicine)  I have updated your Care Teams any recent Medical Services you may have received from other providers in the past year.     Assessment:   This is a routine wellness examination for Madison Miller.  Hearing/Vision screen Hearing Screening - Comments:: Patient denies any hearing difficulties.   Vision Screening - Comments:: Pt says their vision is good with glasses Dr    Mattie Addressed             This Visit's Progress    COMPLETED: Increase physical activity       Starting 03/08/2017, I attempt to ride my stationary bike for at least 30 min 2 days per week.     Patient Stated       07/04/24-No new goals.       Depression Screen     07/03/2024    1:50 PM 07/07/2023    2:10 PM 12/15/2022   11:51 AM 10/28/2022   12:47 PM 03/27/2021   12:06 PM 03/20/2020   11:53 AM 02/28/2020   11:45 AM  PHQ 2/9 Scores  PHQ - 2 Score 0 0 0 0 0 0 0    Fall Risk     07/03/2024    1:47 PM 05/16/2024   11:33 AM 11/21/2023   11:01 AM 07/07/2023    2:10 PM 12/15/2022   11:53 AM  Fall Risk   Falls in the past year? 0 0 0 0 0  Number falls in past yr: 0 0 0  0  Injury with Fall? 0 0 0  0  Risk for fall due to : No Fall Risks No Fall Risks   No Fall Risks  Follow up Education provided;Falls prevention discussed Falls evaluation completed   Falls prevention discussed;Falls evaluation completed    MEDICARE RISK AT HOME:  Medicare Risk at Home Any stairs in or around the home?: Yes If so, are there any without handrails?: Yes Home free of loose throw  rugs in walkways, pet beds, electrical cords, etc?: Yes Adequate lighting in your home to reduce risk of falls?: Yes Life alert?: No Use of a cane, walker or w/c?: No Grab bars in the bathroom?: No Shower chair or bench in shower?: No Elevated toilet seat or a handicapped toilet?: No  TIMED UP AND GO:  Was the test  performed?  No  Cognitive Function: 6CIT completed    03/08/2017   10:46 AM 12/10/2015   11:50 AM  MMSE - Mini Mental State Exam  Orientation to time 5  5   Orientation to Place 5  5   Registration 3  3   Attention/ Calculation 0  0   Recall 3  3   Language- name 2 objects 0  0   Language- repeat 1 1  Language- follow 3 step command 3  3   Language- read & follow direction 0  0   Write a sentence 0  0   Copy design 0  0   Total score 20  20      Data saved with a previous flowsheet row definition        07/03/2024    1:53 PM 12/15/2022   11:53 AM  6CIT Screen  What Year? 0 points 0 points  What month? 0 points 0 points  What time? 0 points 0 points  Count back from 20 0 points 0 points  Months in reverse 4 points 0 points  Repeat phrase 0 points 0 points  Total Score 4 points 0 points    Immunizations  There is no immunization history on file for this patient.  Screening Tests Health Maintenance  Topic Date Due   Hepatitis C Screening  Never done   Fecal DNA (Cologuard)  04/07/2024   DTaP/Tdap/Td (1 - Tdap) 07/31/2024 (Originally 09/15/1970)   Influenza Vaccine  12/18/2024 (Originally 04/20/2024)   Zoster Vaccines- Shingrix (1 of 2) 03/28/2026 (Originally [birth year]1970)   Medicare Annual Wellness (AWV)  07/03/2025   DEXA SCAN  Completed   Meningococcal B Vaccine  Aged Out   Pneumococcal Vaccine: 50+ Years  Discontinued   Colonoscopy  Discontinued   COVID-19 Vaccine  Discontinued    Health Maintenance Items Addressed: None  Additional Screening:  Vision Screening: Recommended annual ophthalmology exams for early detection of glaucoma and other disorders of the eye. Is the patient up to date with their annual eye exam?  No  Who is the provider or what is the name of the office in which the patient attends annual eye exams? Dr Dingeldein-pt to schedule  Dental Screening: Recommended annual dental exams for proper oral hygiene  Community Resource Referral /  Chronic Care Management: CRR required this visit?  No   CCM required this visit?  Appt scheduled with PCP   Plan:    I have personally reviewed and noted the following in the patient's chart:   Medical and social history Use of alcohol, tobacco or illicit drugs  Current medications and supplements including opioid prescriptions. Patient is not currently taking opioid prescriptions. Functional ability and status Nutritional status Physical activity Advanced directives List of other physicians Hospitalizations, surgeries, and ER visits in previous 12 months Vitals Screenings to include cognitive, depression, and falls Referrals and appointments  In addition, I have reviewed and discussed with patient certain preventive protocols, quality metrics, and best practice recommendations. A written personalized care plan for preventive services as well as general preventive health recommendations were provided to patient.   Erminio CROME  Longino Trefz, LPN   [id number]   After Visit Summary: (Declined) Due to this being a telephonic visit, with patients personalized plan was offered to patient but patient Declined AVS at this time   Notes: Nothing significant to report at this time.

## 2024-07-03 NOTE — Patient Instructions (Addendum)
 Ms. Droessler,  Thank you for taking the time for your Medicare Wellness Visit. I appreciate your continued commitment to your health goals. Please review the care plan we discussed, and feel free to reach out if I can assist you further.  Medicare recommends these wellness visits once per year to help you and your care team stay ahead of potential health issues. These visits are designed to focus on prevention, allowing your provider to concentrate on managing your acute and chronic conditions during your regular appointments.  Please note that Annual Wellness Visits do not include a physical exam. Some assessments may be limited, especially if the visit was conducted virtually. If needed, we may recommend a separate in-person follow-up with your provider.  Ongoing Care Seeing your primary care provider every 3 to 6 months helps us  monitor your health and provide consistent, personalized care.   Referrals If a referral was made during today's visit and you haven't received any updates within two weeks, please contact the referred provider directly to check on the status.  Recommended Screenings:  Health Maintenance  Topic Date Due   Hepatitis C Screening  Never done   Cologuard (Stool DNA test)  04/07/2024   DTaP/Tdap/Td vaccine (1 - Tdap) 07/31/2024*   Flu Shot  12/18/2024*   Zoster (Shingles) Vaccine (1 of 2) 03/28/2026*   Medicare Annual Wellness Visit  07/03/2025   DEXA scan (bone density measurement)  Completed   Meningitis B Vaccine  Aged Out   Pneumococcal Vaccine for age over 72  Discontinued   Colon Cancer Screening  Discontinued   COVID-19 Vaccine  Discontinued  *Topic was postponed. The date shown is not the original due date.       11/11/2023    3:38 PM  Advanced Directives  Does Patient Have a Medical Advance Directive? No  Would patient like information on creating a medical advance directive? No - Patient declined   Advance Care Planning is important because  it: Ensures you receive medical care that aligns with your values, goals, and preferences. Provides guidance to your family and loved ones, reducing the emotional burden of decision-making during critical moments.  Vision: Annual vision screenings are recommended for early detection of glaucoma, cataracts, and diabetic retinopathy. These exams can also reveal signs of chronic conditions such as diabetes and high blood pressure. Please make your annual eye appointment with your eye doctor:  [facility] [address] [place], [place]  [address]  Phone: [phone]  Dental: Annual dental screenings help detect early signs of oral cancer, gum disease, and other conditions linked to overall health, including heart disease and diabetes.

## 2024-08-23 DIAGNOSIS — H52223 Regular astigmatism, bilateral: Secondary | ICD-10-CM | POA: Diagnosis not present

## 2024-08-23 DIAGNOSIS — H25013 Cortical age-related cataract, bilateral: Secondary | ICD-10-CM | POA: Diagnosis not present

## 2024-08-30 ENCOUNTER — Other Ambulatory Visit: Payer: Self-pay | Admitting: Family

## 2024-08-30 DIAGNOSIS — I509 Heart failure, unspecified: Secondary | ICD-10-CM

## 2024-08-31 ENCOUNTER — Encounter: Payer: Self-pay | Admitting: Family Medicine

## 2024-08-31 ENCOUNTER — Ambulatory Visit: Admitting: Family Medicine

## 2024-08-31 DIAGNOSIS — I10 Essential (primary) hypertension: Secondary | ICD-10-CM | POA: Diagnosis not present

## 2024-08-31 DIAGNOSIS — R051 Acute cough: Secondary | ICD-10-CM | POA: Diagnosis not present

## 2024-08-31 DIAGNOSIS — M25511 Pain in right shoulder: Secondary | ICD-10-CM | POA: Diagnosis not present

## 2024-08-31 LAB — POC COVID19 BINAXNOW: SARS Coronavirus 2 Ag: NEGATIVE

## 2024-08-31 MED ORDER — METOPROLOL SUCCINATE ER 25 MG PO TB24: 12.5000 mg | ORAL_TABLET | Freq: Every day | ORAL | 1 refills | Status: AC

## 2024-08-31 MED ORDER — HALOBETASOL PROPIONATE 0.05 % EX CREA: TOPICAL_CREAM | Freq: Every day | CUTANEOUS | 0 refills | Status: AC | PRN

## 2024-08-31 MED ORDER — PREDNISONE 20 MG PO TABS: ORAL_TABLET | ORAL | 0 refills | Status: DC

## 2024-08-31 MED ORDER — METHOCARBAMOL 500 MG PO TABS: 500.0000 mg | ORAL_TABLET | Freq: Every evening | ORAL | 1 refills | Status: AC | PRN

## 2024-08-31 NOTE — Progress Notes (Unsigned)
 Patient ID: Madison Miller, female    DOB: 08-Mar-1951, 73 y.o.   MRN: [id number]  This visit was conducted in person.  BP 90/70   Pulse 68   Temp 98.8 F (37.1 C) (Temporal)   Ht 5' 5 (1.651 m)   Wt 114 lb 6 oz (51.9 kg)   SpO2 96%   BMI 19.03 kg/m    CC:  Chief Complaint  Patient presents with   Shoulder Pain    Right   Cough    X 1 day    Subjective:   HPI: Madison Miller is a 73 y.o. female presenting on 08/31/2024 for Shoulder Pain (Right) and Cough (X 1 day)   Right shoulder pain off and on painful.. lateral pain. Pain with any movement.  No falls, no change in activity    No pain with neck movement, mild neck pain.  NO radiation of pain to arm.  No numbness or weakness in hands.  Has tried to use PT rub.   She has noted dry cough in last day, sore throat. No fever. No SOB. NO COPD, no asthma, nonsmoking.  Relevant past medical, surgical, family and social history reviewed and updated as indicated. Interim medical history since our last visit reviewed. Allergies and medications reviewed and updated. Outpatient Medications Prior to Visit  Medication Sig Dispense Refill   acetaminophen  (TYLENOL ) 500 MG tablet Take 500 mg by mouth as needed for moderate pain or headache.      ciclopirox  (PENLAC ) 8 % solution Apply topically at bedtime. Apply over nail and surrounding skin. Apply daily over previous coat. After seven (7) days, may remove with alcohol and continue cycle. 6.6 mL 0   cyclobenzaprine  (FLEXERIL ) 5 MG tablet Take 1 tablet (5 mg total) by mouth at bedtime. 15 tablet 0   famotidine  (PEPCID ) 20 MG tablet TAKE 1 TABLET BY MOUTH TWICE A DAY 180 tablet 1   furosemide  (LASIX ) 20 MG tablet TAKE 1 TABLET BY MOUTH EVERY DAY 90 tablet 0   lisinopril  (ZESTRIL ) 5 MG tablet Take 1 tablet (5 mg total) by mouth daily. 90 tablet 3   methocarbamol  (ROBAXIN ) 500 MG tablet Take 1 tablet (500 mg total) by mouth at bedtime as needed for muscle spasms  (back pain). 20 tablet 1   metoprolol  succinate (TOPROL -XL) 25 MG 24 hr tablet TAKE 1/2 TABLET (12.5MG ) BY MOUTH EVERY DAY 45 tablet 1   omeprazole  (PRILOSEC) 20 MG capsule TAKE 1 CAPSULE BY MOUTH EVERY DAY 90 capsule 1   potassium chloride  (KLOR-CON ) 10 MEQ tablet TAKE 1 TABLET BY MOUTH EVERY DAY 90 tablet 3   predniSONE  (DELTASONE ) 20 MG tablet 3 tabs by mouth daily x 3 days, then 2 tabs by mouth daily x 2 days then 1 tab by mouth daily x 2 days (Patient not taking: Reported on 07/03/2024) 15 tablet 0   No facility-administered medications prior to visit.     Per HPI unless specifically indicated in ROS section below Review of Systems Objective:  BP 90/70   Pulse 68   Temp 98.8 F (37.1 C) (Temporal)   Ht 5' 5 (1.651 m)   Wt 114 lb 6 oz (51.9 kg)   SpO2 96%   BMI 19.03 kg/m   Wt Readings from Last 3 Encounters:  08/31/24 114 lb 6 oz (51.9 kg)  07/03/24 109 lb (49.4 kg)  05/16/24 109 lb 9.6 oz (49.7 kg)      Physical Exam    Results for orders  placed or performed in visit on 01/09/24  POCT Urinalysis Dipstick (Automated)   Collection Time: 01/09/24 11:08 AM  Result Value Ref Range   Color, UA yellow    Clarity, UA clear    Glucose, UA Negative Negative   Bilirubin, UA neg    Ketones, UA positive (A)    Spec Grav, UA 1.020 1.010 - 1.025   Blood, UA positive (small) (PU)    pH, UA 5.0 5.0 - 8.0   Protein, UA Positive (A) Negative   Urobilinogen, UA 0.2 0.2 or 1.0 E.U./dL   Nitrite, UA negative    Leukocytes, UA Negative Negative  Urinalysis w microscopic + reflex cultur   Collection Time: 01/09/24 11:11 AM   Specimen: Urine  Result Value Ref Range   Color, Urine YELLOW YELLOW   APPearance CLEAR CLEAR   Specific Gravity, Urine 1.013 1.001 - 1.035   pH 5.5 5.0 - 8.0   Glucose, UA NEGATIVE NEGATIVE   Bilirubin Urine NEGATIVE NEGATIVE   Ketones, ur TRACE (A) NEGATIVE   Hgb urine dipstick NEGATIVE NEGATIVE   Protein, ur NEGATIVE NEGATIVE   Nitrites, Initial  NEGATIVE NEGATIVE   Leukocyte Esterase NEGATIVE NEGATIVE   WBC, UA 0-5 0 - 5 /HPF   RBC / HPF NONE SEEN 0 - 2 /HPF   Squamous Epithelial / HPF NONE SEEN < OR = 5 /HPF   Bacteria, UA FEW (A) NONE SEEN /HPF   Hyaline Cast NONE SEEN NONE SEEN /LPF   Note    REFLEXIVE URINE CULTURE   Collection Time: 01/09/24 11:11 AM  Result Value Ref Range   Reflexve Urine Culture      Assessment and Plan  There are no diagnoses linked to this encounter.  No follow-ups on file.   Greig Ring, MD

## 2024-08-31 NOTE — Patient Instructions (Signed)
 Complete prednisone  taper, take in mornings.  Can use tylenol  ES 2 tablets three times daily for sore throat.  Can use mucinex DM as need for cough.   Expect to improve within next 5- days.

## 2024-09-01 DIAGNOSIS — M25511 Pain in right shoulder: Secondary | ICD-10-CM | POA: Insufficient documentation

## 2024-09-01 NOTE — Assessment & Plan Note (Addendum)
 Acute, ongoing for 24 hours.  Most consistent with viral upper respiratory tract infection.  Negative COVID testing.  No sign of bacterial infection.  Recommended symptomatic care rest, fluids and time.  Return and ER precautions provided.

## 2024-09-01 NOTE — Assessment & Plan Note (Signed)
 Acute, most consistent with shoulder bursitis. Recommend range of motion exercises and ice. Will provide prednisone  taper as anti-inflammatory. No red flags or indication for x-ray.  Follow-up if not improving as expected

## 2024-09-18 ENCOUNTER — Ambulatory Visit: Payer: Self-pay

## 2024-09-18 ENCOUNTER — Ambulatory Visit (INDEPENDENT_AMBULATORY_CARE_PROVIDER_SITE_OTHER)

## 2024-09-18 VITALS — BP 124/80 | HR 71 | Temp 98.4°F | Resp 14 | Ht 65.0 in | Wt 116.0 lb

## 2024-09-18 DIAGNOSIS — J0110 Acute frontal sinusitis, unspecified: Secondary | ICD-10-CM | POA: Diagnosis not present

## 2024-09-18 DIAGNOSIS — R051 Acute cough: Secondary | ICD-10-CM | POA: Diagnosis not present

## 2024-09-18 DIAGNOSIS — M791 Myalgia, unspecified site: Secondary | ICD-10-CM

## 2024-09-18 LAB — POCT INFLUENZA A/B
Influenza A, POC: NEGATIVE
Influenza B, POC: NEGATIVE

## 2024-09-18 LAB — POC COVID19 BINAXNOW: SARS Coronavirus 2 Ag: NEGATIVE

## 2024-09-18 MED ORDER — ACETAMINOPHEN 500 MG PO TABS
1000.0000 mg | ORAL_TABLET | Freq: Once | ORAL | Status: AC
  Administered 2024-09-18: 1000 mg via ORAL

## 2024-09-18 MED ORDER — AMOXICILLIN-POT CLAVULANATE 875-125 MG PO TABS: 1.0000 | ORAL_TABLET | Freq: Two times a day (BID) | ORAL | 0 refills | Status: AC

## 2024-09-18 MED ORDER — BENZONATATE 200 MG PO CAPS: 200.0000 mg | ORAL_CAPSULE | Freq: Three times a day (TID) | ORAL | 0 refills | Status: AC | PRN

## 2024-09-18 MED ORDER — DICLOFENAC SODIUM 1 % EX GEL: 2.0000 g | Freq: Four times a day (QID) | CUTANEOUS | 1 refills | Status: AC

## 2024-09-18 NOTE — Patient Instructions (Addendum)
 Thank you for visiting [facility] today! Here's what we talked about: - START Augmentin  antibiotic - Pick up over the counter probiotic (Culturelle or Natures bounty), icy hot patches for pain. - Pick up extra strength tylenol , take 2 tablets every 8 hours for next 3 days then afterwards only as needed.  - Return to clinic if not improved in 7 days

## 2024-09-18 NOTE — Progress Notes (Signed)
 "  Subjective:   This visit was conducted in person. The patient gave informed consent to the use of Abridge AI technology to record the contents of the encounter as documented below.   Patient ID: Madison Miller, female    DOB: [birth year], 73 y.o.   MRN: [id number]   Discussed the use of AI scribe software for clinical note transcription with the patient, who gave verbal consent to proceed.  History of Present Illness Madison Miller is a 73 year old female who presents with recurrent cough and chest pain.  She has been experiencing a recurrent cough that initially improved after a visit two weeks ago but has since returned approximately two to three days ago. The cough is not associated with fever and does not interfere with her sleep. She occasionally produces yellow or light-colored sputum.  She describes a headache located at the front of her head, which began around the same time as the cough recurrence, approximately three days ago. She also reports occasional nasal congestion. Occasional nasal congestion and yellowish-green nasal discharge are present. No sore throat or difficulty breathing.  She reports chest pain localized to a specific area, exacerbated by coughing, which started around the same time as the headache and cough recurrence. No generalized chest pain or palpitations.  Additionally, she mentions experiencing leg pain that began three days ago. The pain is localized, does not radiate, and is exacerbated by standing or walking. It is relieved by heat application. No leg swelling or weight gain.  She has been using over-the-counter medications for her symptoms, including Tylenol , but is unsure if it is extra strength. She has also applied heat to her leg, which provided some relief.    Review of Systems  All other systems reviewed and are negative.       Allergies[1]  Medications Ordered Prior to Encounter[2]  BP 124/80 (BP Location: Right Arm, Patient  Position: Sitting, Cuff Size: Normal)   Pulse 71   Temp 98.4 F (36.9 C) (Oral)   Resp 14   Ht 5' 5 (1.651 m)   Wt 116 lb (52.6 kg)   SpO2 98%   BMI 19.30 kg/m   Objective:      Physical Exam VITALS: T- 98.4 GENERAL: Alert, cooperative, well developed, no acute distress. HEAD: Normocephalic atraumatic. Left frontal sinus tenderness. EYES: Extraocular movements intact BL, pupils round, equal and reactive to light BL, conjunctivae normal BL. EARS: Tympanic membrane, ear canal and external ear normal BL. NOSE: Mild rhinorrhea, mucous membranes are moist. THROAT: No oropharyngeal exudate or posterior oropharyngeal erythema. Throat normal. CARDIOVASCULAR: Normal heart rate and rhythm, S1 and S2 normal without murmurs. CHEST: Clear to auscultation bilaterally, no wheezes, rhonchi, or crackles. Point TTP over the L chest wall, lateral to the mid-sternum,  ABDOMEN: Soft, non tender, non distended, without organomegaly, normal bowel sounds. EXTREMITIES: No cyanosis or edema. NEUROLOGICAL: Oriented to person, place and time, no gait abnormalities, moves all extremities without gross motor or sensory deficit. Right thigh: TTP over the mid antero-lateral thigh, no swelling or erythema NECK: Mild cervical lymphadenopathy.         Assessment & Plan:    Assessment & Plan Subacute frontal sinusitis with cough Symptoms suggest bacterial sinusitis given clinical hx of sx briefly improving before worsening again. Low suspicion for pneumonia given no shortness of breath, chest pain or systemic symptoms, will hold off on x-ray for now. Negative COVID and flu  - Prescribed Augmentin  1 tablet twice a day for 5  days. - Recommended over-the-counter probiotic to mitigate antibiotic-associated gastrointestinal side effects. - Advised taking antibiotics with food to reduce stomach discomfort - Prescribed Tessalon  1 capsule three times a day for daytime cough. - Extra strength Tylenol  for frontal  headache, avoiding NSAIDs given CKD  Musculoskeletal pain Likely strained intercostal muscles, no clinical evidence of cardiac involvement. Pain reproducible on chest manipulation. Similar pain over R thigh, treating as below. - Prescribed Voltaren  gel to apply four times a day on the affected area. - Advised applying heat after gel application to increase blood flow and relax muscles. - Recommended over-the-counter Icy Hot patches for additional pain relief.     Return for worsening of symptoms or failure to improve.   Davi Rotan K Daryle Boyington, MD  09/18/2024     Contains text generated by Abridge.        [1]  Allergies Allergen Reactions   Lactose Other (See Comments)    Pain,bloating   Lactose Intolerance (Gi) Other (See Comments)    Pain,bloating  [2]  Current Outpatient Medications on File Prior to Visit  Medication Sig Dispense Refill   acetaminophen  (TYLENOL ) 500 MG tablet Take 500 mg by mouth as needed for moderate pain or headache.      ciclopirox  (PENLAC ) 8 % solution Apply topically at bedtime. Apply over nail and surrounding skin. Apply daily over previous coat. After seven (7) days, may remove with alcohol and continue cycle. 6.6 mL 0   cyclobenzaprine  (FLEXERIL ) 5 MG tablet Take 1 tablet (5 mg total) by mouth at bedtime. 15 tablet 0   famotidine  (PEPCID ) 20 MG tablet TAKE 1 TABLET BY MOUTH TWICE A DAY 180 tablet 1   furosemide  (LASIX ) 20 MG tablet TAKE 1 TABLET BY MOUTH EVERY DAY 90 tablet 0   halobetasol  (ULTRAVATE ) 0.05 % cream Apply topically daily as needed (vaginal itching). 15 g 0   lisinopril  (ZESTRIL ) 5 MG tablet Take 1 tablet (5 mg total) by mouth daily. 90 tablet 3   methocarbamol  (ROBAXIN ) 500 MG tablet Take 1 tablet (500 mg total) by mouth at bedtime as needed for muscle spasms (back pain). 20 tablet 1   metoprolol  succinate (TOPROL -XL) 25 MG 24 hr tablet Take 0.5 tablets (12.5 mg total) by mouth daily. TAKE 1/2 TABLET (12.5MG ) BY MOUTH EVERY DAY 45 tablet 1    omeprazole  (PRILOSEC) 20 MG capsule TAKE 1 CAPSULE BY MOUTH EVERY DAY 90 capsule 1   potassium chloride  (KLOR-CON ) 10 MEQ tablet TAKE 1 TABLET BY MOUTH EVERY DAY 90 tablet 3   No current facility-administered medications on file prior to visit.   "

## 2024-09-18 NOTE — Telephone Encounter (Signed)
 FYI Only or Action Required?: FYI only for provider: appointment scheduled on 09/18/2024 at 3:40 PM-patient requesting to be seen today.  Patient was last seen in primary care on 08/31/2024 by Avelina Greig BRAVO, MD.  Called Nurse Triage reporting Headache.  Symptoms began yesterday.  Interventions attempted: Rest, hydration, or home remedies.  Symptoms are: unchanged.  Triage Disposition: See Physician Within 24 Hours  Patient/caregiver understands and will follow disposition?: Yes  Copied from CRM #[id number]. Topic: Clinical - Red Word Triage >> Sep 18, 2024  8:44 AM Amy B wrote: Red Word that prompted transfer to Nurse Triage: Headache, cough, right leg soreness Reason for Disposition  [1] MODERATE headache (e.g., interferes with normal activities) AND [2] present > 24 hours AND [3] unexplained  (Exceptions: Pain medicines not tried, typical migraine, or headache part of viral illness.)  Answer Assessment - Initial Assessment Questions Reports headache, cough and right leg soreness-all started a day ago.   1. LOCATION: Where does it hurt?      Frontal area 2. ONSET: When did the headache start? (e.g., minutes, hours, days)      Started yesterday 3. PATTERN: Does the pain come and go, or has it been constant since it started?     constant 4. SEVERITY: How bad is the pain? and What does it keep you from doing?  (e.g., Scale 1-10; mild, moderate, or severe)     7  5. RECURRENT SYMPTOM: Have you ever had headaches before? If Yes, ask: When was the last time? and What happened that time?      Yes but unsure of when the last time was  6. CAUSE: What do you think is causing the headache?     unsure 7. MIGRAINE: Have you been diagnosed with migraine headaches? If Yes, ask: Is this headache similar?      unsure 8. HEAD INJURY: Has there been any recent injury to your head?      no 9. OTHER SYMPTOMS: Do you have any other symptoms? (e.g., fever, stiff neck, eye  pain, sore throat, cold symptoms)     Cough and right leg soreness  Protocols used: Headache-A-AH

## 2024-10-10 NOTE — Progress Notes (Signed)
 Madison Miller                                          MRN: [id number]   10/10/2024   The [facility] Quality Team Specialist reviewed this patient medical record for the purposes of chart review for care gap closure. The following were reviewed: chart review for care gap closure-breast cancer screening.    [facility] Quality Team

## 2024-11-21 ENCOUNTER — Encounter: Admitting: Family

## 2025-07-04 ENCOUNTER — Ambulatory Visit
